# Patient Record
Sex: Male | Born: 1970 | Race: Black or African American | Hispanic: No | Smoking: Former smoker
Health system: Southern US, Community
[De-identification: ages and names within clinical notes are randomized; demographics above are authoritative.]

## PROBLEM LIST (undated history)

## (undated) DIAGNOSIS — B20 Human immunodeficiency virus [HIV] disease: Secondary | ICD-10-CM

## (undated) DIAGNOSIS — N289 Disorder of kidney and ureter, unspecified: Secondary | ICD-10-CM

## (undated) DIAGNOSIS — A159 Respiratory tuberculosis unspecified: Secondary | ICD-10-CM

---

## 2011-01-23 HISTORY — PX: ABSCESS DRAINAGE: SHX1119

## 2014-05-20 DIAGNOSIS — Z720 Tobacco use: Secondary | ICD-10-CM | POA: Insufficient documentation

## 2014-05-20 DIAGNOSIS — Z21 Asymptomatic human immunodeficiency virus [HIV] infection status: Secondary | ICD-10-CM | POA: Insufficient documentation

## 2017-01-31 ENCOUNTER — Other Ambulatory Visit: Payer: Self-pay

## 2017-01-31 ENCOUNTER — Emergency Department: Payer: Self-pay

## 2017-01-31 ENCOUNTER — Emergency Department
Admission: EM | Admit: 2017-01-31 | Discharge: 2017-01-31 | Disposition: A | Discharge status: Home / Self Care | Payer: Self-pay | Attending: Student in an Organized Health Care Education/Training Program | Admitting: Student in an Organized Health Care Education/Training Program

## 2017-01-31 ENCOUNTER — Encounter: Payer: Self-pay | Admitting: Emergency Medicine

## 2017-01-31 DIAGNOSIS — F17210 Nicotine dependence, cigarettes, uncomplicated: Secondary | ICD-10-CM | POA: Insufficient documentation

## 2017-01-31 DIAGNOSIS — J189 Pneumonia, unspecified organism: Secondary | ICD-10-CM

## 2017-01-31 DIAGNOSIS — J181 Lobar pneumonia, unspecified organism: Secondary | ICD-10-CM | POA: Insufficient documentation

## 2017-01-31 DIAGNOSIS — R0789 Other chest pain: Secondary | ICD-10-CM | POA: Insufficient documentation

## 2017-01-31 DIAGNOSIS — R05 Cough: Secondary | ICD-10-CM | POA: Insufficient documentation

## 2017-01-31 DIAGNOSIS — R10814 Left lower quadrant abdominal tenderness: Secondary | ICD-10-CM | POA: Insufficient documentation

## 2017-01-31 LAB — COMPREHENSIVE METABOLIC PANEL
ALBUMIN: 3.2 g/dL — AB (ref 3.5–5.0)
ALK PHOS: 183 U/L — AB (ref 38–126)
ALT: 15 U/L — ABNORMAL LOW (ref 17–63)
ANION GAP: 9 (ref 5–15)
AST: 37 U/L (ref 15–41)
BUN: 10 mg/dL (ref 6–20)
CHLORIDE: 98 mmol/L — AB (ref 101–111)
CO2: 26 mmol/L (ref 22–32)
Calcium: 8.6 mg/dL — ABNORMAL LOW (ref 8.9–10.3)
Creatinine, Ser: 0.81 mg/dL (ref 0.61–1.24)
GFR calc non Af Amer: >60 mL/min (ref >60)
GLUCOSE: 91 mg/dL (ref 65–99)
Potassium: 3.9 mmol/L (ref 3.5–5.1)
Sodium: 133 mmol/L — ABNORMAL LOW (ref 135–145)
Total Bilirubin: 1.6 mg/dL — ABNORMAL HIGH (ref 0.3–1.2)
Total Protein: 8.6 g/dL — ABNORMAL HIGH (ref 6.5–8.1)

## 2017-01-31 LAB — URINALYSIS, COMPLETE (UACMP) WITH MICROSCOPIC
Glucose, UA: 100 mg/dL — AB
HGB URINE DIPSTICK: NEGATIVE
KETONES UR: 15 mg/dL — AB
LEUKOCYTES UA: NEGATIVE
NITRITE: NEGATIVE
Protein, ur: 30 mg/dL — AB
Specific Gravity, Urine: 1.02 (ref 1.005–1.030)
pH: 6 (ref 5.0–8.0)

## 2017-01-31 LAB — CBC WITH DIFFERENTIAL/PLATELET
BASOS PCT: 0 %
Basophils Absolute: 0 10*3/uL (ref 0–0.1)
EOS PCT: 1 %
Eosinophils Absolute: 0 10*3/uL (ref 0–0.7)
HCT: 46 % (ref 40.0–52.0)
HEMOGLOBIN: 16.3 g/dL (ref 13.0–18.0)
LYMPHS ABS: 2.9 10*3/uL (ref 1.0–3.6)
Lymphocytes Relative: 42 %
MCH: 37.6 pg — AB (ref 26.0–34.0)
MCHC: 35.4 g/dL (ref 32.0–36.0)
MCV: 106.3 fL — ABNORMAL HIGH (ref 80.0–100.0)
MONOS PCT: 17 %
Monocytes Absolute: 1.1 10*3/uL — ABNORMAL HIGH (ref 0.2–1.0)
NEUTROS PCT: 40 %
Neutro Abs: 2.7 10*3/uL (ref 1.4–6.5)
PLATELETS: 235 10*3/uL (ref 150–440)
RBC: 4.33 MIL/uL — AB (ref 4.40–5.90)
RDW: 13.7 % (ref 11.5–14.5)
WBC: 6.7 10*3/uL (ref 3.8–10.6)

## 2017-01-31 LAB — LIPASE, BLOOD: Lipase: 20 U/L (ref 11–51)

## 2017-01-31 MED ORDER — AZITHROMYCIN 500 MG PO TABS
500.0000 mg | ORAL_TABLET | Freq: Every day | ORAL | Status: DC
  Administered 2017-01-31: 500 mg via ORAL
  Filled 2017-01-31: qty 1

## 2017-01-31 MED ORDER — SODIUM CHLORIDE 0.9 % IV BOLUS (SEPSIS)
1000.0000 mL | Freq: Once | INTRAVENOUS | Status: AC
  Administered 2017-01-31: 1000 mL via INTRAVENOUS

## 2017-01-31 MED ORDER — AZITHROMYCIN 250 MG PO TABS: ORAL_TABLET | ORAL | 0 refills | Status: DC

## 2017-01-31 NOTE — ED Notes (Addendum)
See triage note  States he developed right lower back pain about 1 week ago  States he has had some cough  Pain increases with cough and some movement denies any injury or urinary sx's  States he did have cough and noticed some blood this am with cough

## 2017-01-31 NOTE — ED Provider Notes (Signed)
[facility] Emergency Department Provider Note  ____________________________________________   First MD Initiated Contact with Patient 01/31/17 1403     (approximate)  I have reviewed the triage vital signs and the nursing notes.   HISTORY  Chief Complaint Back Pain   HPI Joshua Dougherty is a 47 y.o. male is here with complaint of right-sided back pain for 1 week.  Patient denies any injury or urinary symptoms.  He also denies any previous history of kidney stones.  He denies radiation into his right leg.  Patient also has productive cough and has felt feverish for the last 4 days.  Patient is concerned because he saw some blood in the mucus he coughed up this morning.  Patient a smoker and currently smokes less than a half a pack per day.  He rates his pain as an 8 out of 10.  History reviewed. No pertinent past medical history.  There are no active problems to display for this patient.   History reviewed. No pertinent surgical history.  Prior to Admission medications   Medication Sig Start Date End Date Taking? Authorizing Provider  azithromycin (ZITHROMAX Z-PAK) 250 MG tablet Take 1 tablet every day for the next 4 days starting Friday 01/31/17   Johnn Hai, PA-C    Allergies Patient has no known allergies.  History reviewed. No pertinent family history.  Social History Social History   Tobacco Use  . Smoking status: Never Smoker  . Smokeless tobacco: Never Used  Substance Use Topics  . Alcohol use: Yes    Frequency: Never  . Drug use: No    Review of Systems Constitutional: Subjective fever/no chills Eyes: No visual changes. ENT: No sore throat. Cardiovascular: Denies chest pain. Respiratory: Denies shortness of breath.  Positive for productive cough.  Positive hemoptysis today. Gastrointestinal: No abdominal pain.  No nausea, no vomiting.   Genitourinary: Negative for dysuria. Musculoskeletal: Positive for right flank pain.   Positive back pain. Skin: Negative for rash. Neurological: Negative for headaches, focal weakness or numbness. ____________________________________________   PHYSICAL EXAM:  VITAL SIGNS: ED Triage Vitals [01/31/17 1321]  Enc Vitals Group     BP      Pulse      Resp      Temp      Temp src      SpO2      Weight      Height      Head Circumference      Peak Flow      Pain Score 8     Pain Loc      Pain Edu?      Excl. in [place]?    Constitutional: Alert and oriented. Well appearing and in no acute distress. Eyes: Conjunctivae are normal.  Head: Atraumatic. Nose: Mild congestion/no rhinnorhea. Mouth/Throat: Mucous membranes are moist.  Oropharynx non-erythematous. Neck: No stridor.   Hematological/Lymphatic/Immunilogical: No cervical lymphadenopathy. Cardiovascular: Normal rate, regular rhythm. Grossly normal heart sounds.  Good peripheral circulation. Respiratory: Normal respiratory effort.  No retractions. Lungs no rales or rhonchi noted.  No expiratory wheezes. Gastrointestinal: Soft and nontender. No distention.  Right CVA tenderness. Musculoskeletal: On exam of the back there is no gross deformity and no point tenderness on palpation of the thoracic or lumbar spine.  Range of motion is not restricted.  Straight leg raises were negative.  Good muscle strength bilaterally.  Normal gait was noted. Neurologic:  Normal speech and language. No gross focal neurologic deficits are appreciated.  Reflexes 2+ bilaterally.  No gait instability. Skin:  Skin is warm, dry and intact.  Psychiatric: Mood and affect are normal. Speech and behavior are normal.  ____________________________________________   LABS (all labs ordered are listed, but only abnormal results are displayed)  Labs Reviewed  URINALYSIS, COMPLETE (UACMP) WITH MICROSCOPIC - Abnormal; Notable for the following components:      Result Value   Color, Urine AMBER (*)    Glucose, UA 100 (*)    Bilirubin Urine MODERATE  (*)    Ketones, ur 15 (*)    Protein, ur 30 (*)    Squamous Epithelial / LPF 0-5 (*)    Bacteria, UA RARE (*)    All other components within normal limits  CBC WITH DIFFERENTIAL/PLATELET - Abnormal; Notable for the following components:   RBC 4.33 (*)    MCV 106.3 (*)    MCH 37.6 (*)    Monocytes Absolute 1.1 (*)    All other components within normal limits  COMPREHENSIVE METABOLIC PANEL - Abnormal; Notable for the following components:   Sodium 133 (*)    Chloride 98 (*)    Calcium 8.6 (*)    Total Protein 8.6 (*)    Albumin 3.2 (*)    ALT 15 (*)    Alkaline Phosphatase 183 (*)    Total Bilirubin 1.6 (*)    All other components within normal limits  LIPASE, BLOOD    RADIOLOGY  Dg Chest 2 View  Result Date: 01/31/2017 CLINICAL DATA:  Right-sided chest pain and cough for 1 week. Hemoptysis last night. Smoker. EXAM: CHEST  2 VIEW COMPARISON:  None. FINDINGS: Ill-defined opacity within the posterior aspect of the right lower lobe, best seen on the lateral projection. Probable small right pleural effusion causing blunting of the right costophrenic angle. Left lung is clear. Heart size and mediastinal contours are normal. No acute or suspicious osseous finding. IMPRESSION: Right lower lobe airspace opacity and probable small associated pleural effusion. If febrile, this would most likely represent pneumonia. Neoplastic process cannot be confidently excluded given the history of smoking and hemoptysis. Pulmonary embolism could also cause a similar appearance, if clinical findings correlate. Consider chest CT or chest CT angiogram for further characterization. Electronically Signed   By: Franki Cabot M.D.   On: 01/31/2017 16:05    ____________________________________________   PROCEDURES  Procedure(s) performed: None  Procedures  Critical Care performed: No  ____________________________________________   INITIAL IMPRESSION / ASSESSMENT AND PLAN / ED COURSE Patient was made  aware of chest x-ray findings in the setting that he is febrile most likely is pneumonia.  He was given a list of clinics to establish primary care for follow-up.  He was given Zithromax 500 mg p.o. while in the department getting fluids.  He also was given a prescription at the time of discharge for Zithromax 250 mg 1 daily for the next 4 days.  He will increase fluids.  Take Tylenol or ibuprofen as needed for fever or backache.  He is encouraged to increase fluids. ____________________________________________   FINAL CLINICAL IMPRESSION(S) / ED DIAGNOSES  Final diagnoses:  Pneumonia of right lower lobe due to infectious organism [facility])     ED Discharge Orders        Ordered    azithromycin (ZITHROMAX Z-PAK) 250 MG tablet     01/31/17 1622       Note:  This document was prepared using Dragon voice recognition software and may include unintentional dictation errors.   Letitia Neri  L, PA-C 02/01/17 0388    Merlyn Lot, MD 02/01/17 [phone]

## 2017-01-31 NOTE — Discharge Instructions (Signed)
Follow up with one of the clinics listed on your discharge papers.  [facility] available [facility].   Take Zithromax beginning Friday as you had your first dose in the emergency department.  Increase fluids including Gatorade.  Return to the emergency department if any worsening of your symptoms.  Tylenol if needed for back pain or fever.  Discontinue smoking.

## 2017-01-31 NOTE — ED Triage Notes (Signed)
Pt to ed with c/o right flank pain x 1 week.  Pt denies injury. Denies urinary difficulty or pain.  Denies radiation of pain.

## 2018-03-17 ENCOUNTER — Emergency Department: Payer: Self-pay

## 2018-03-17 ENCOUNTER — Inpatient Hospital Stay
Admission: EM | Admit: 2018-03-17 | Discharge: 2018-03-21 | DRG: 177 | Disposition: A | Discharge status: Home / Self Care | Payer: Self-pay | Attending: Internal Medicine | Admitting: Internal Medicine

## 2018-03-17 ENCOUNTER — Other Ambulatory Visit: Payer: Self-pay

## 2018-03-17 DIAGNOSIS — R042 Hemoptysis: Secondary | ICD-10-CM | POA: Diagnosis present

## 2018-03-17 DIAGNOSIS — F17200 Nicotine dependence, unspecified, uncomplicated: Secondary | ICD-10-CM | POA: Diagnosis present

## 2018-03-17 DIAGNOSIS — J850 Gangrene and necrosis of lung: Principal | ICD-10-CM | POA: Diagnosis present

## 2018-03-17 DIAGNOSIS — E871 Hypo-osmolality and hyponatremia: Secondary | ICD-10-CM | POA: Diagnosis present

## 2018-03-17 DIAGNOSIS — M109 Gout, unspecified: Secondary | ICD-10-CM | POA: Diagnosis present

## 2018-03-17 DIAGNOSIS — B20 Human immunodeficiency virus [HIV] disease: Secondary | ICD-10-CM | POA: Diagnosis present

## 2018-03-17 DIAGNOSIS — R17 Unspecified jaundice: Secondary | ICD-10-CM

## 2018-03-17 DIAGNOSIS — Z8615 Personal history of latent tuberculosis infection: Secondary | ICD-10-CM

## 2018-03-17 DIAGNOSIS — J984 Other disorders of lung: Secondary | ICD-10-CM

## 2018-03-17 DIAGNOSIS — J189 Pneumonia, unspecified organism: Secondary | ICD-10-CM

## 2018-03-17 DIAGNOSIS — E43 Unspecified severe protein-calorie malnutrition: Secondary | ICD-10-CM | POA: Diagnosis present

## 2018-03-17 DIAGNOSIS — R748 Abnormal levels of other serum enzymes: Secondary | ICD-10-CM

## 2018-03-17 DIAGNOSIS — Z681 Body mass index (BMI) 19 or less, adult: Secondary | ICD-10-CM

## 2018-03-17 DIAGNOSIS — Z23 Encounter for immunization: Secondary | ICD-10-CM

## 2018-03-17 NOTE — ED Triage Notes (Signed)
Pt states he has been sick with a cough and congestion for the past week, states today has bloody sputum, denies having a sore throat. Pt is in NAD at present. Respirations WNL,skin is warm and dry.

## 2018-03-18 ENCOUNTER — Other Ambulatory Visit: Payer: Self-pay

## 2018-03-18 ENCOUNTER — Emergency Department: Payer: Self-pay

## 2018-03-18 ENCOUNTER — Encounter: Payer: Self-pay | Admitting: Internal Medicine

## 2018-03-18 DIAGNOSIS — R042 Hemoptysis: Secondary | ICD-10-CM | POA: Diagnosis present

## 2018-03-18 DIAGNOSIS — Z8615 Personal history of latent tuberculosis infection: Secondary | ICD-10-CM

## 2018-03-18 DIAGNOSIS — Z21 Asymptomatic human immunodeficiency virus [HIV] infection status: Secondary | ICD-10-CM

## 2018-03-18 DIAGNOSIS — L02213 Cutaneous abscess of chest wall: Secondary | ICD-10-CM

## 2018-03-18 DIAGNOSIS — R634 Abnormal weight loss: Secondary | ICD-10-CM

## 2018-03-18 DIAGNOSIS — F17200 Nicotine dependence, unspecified, uncomplicated: Secondary | ICD-10-CM

## 2018-03-18 DIAGNOSIS — Z8701 Personal history of pneumonia (recurrent): Secondary | ICD-10-CM

## 2018-03-18 DIAGNOSIS — Z681 Body mass index (BMI) 19 or less, adult: Secondary | ICD-10-CM

## 2018-03-18 DIAGNOSIS — J984 Other disorders of lung: Secondary | ICD-10-CM

## 2018-03-18 LAB — CBC WITH DIFFERENTIAL/PLATELET
ABS IMMATURE GRANULOCYTES: 0.04 10*3/uL (ref 0.00–0.07)
Basophils Absolute: 0 10*3/uL (ref 0.0–0.1)
Basophils Relative: 0 %
Eosinophils Absolute: 0 10*3/uL (ref 0.0–0.5)
Eosinophils Relative: 0 %
HEMATOCRIT: 46.1 % (ref 39.0–52.0)
HEMOGLOBIN: 16.2 g/dL (ref 13.0–17.0)
Immature Granulocytes: 1 %
LYMPHS ABS: 2.7 10*3/uL (ref 0.7–4.0)
LYMPHS PCT: 30 %
MCH: 35.4 pg — AB (ref 26.0–34.0)
MCHC: 35.1 g/dL (ref 30.0–36.0)
MCV: 100.7 fL — ABNORMAL HIGH (ref 80.0–100.0)
MONO ABS: 1 10*3/uL (ref 0.1–1.0)
MONOS PCT: 11 %
Neutro Abs: 5.2 10*3/uL (ref 1.7–7.7)
Neutrophils Relative %: 58 %
Platelets: 350 10*3/uL (ref 150–400)
RBC: 4.58 MIL/uL (ref 4.22–5.81)
RDW: 12.1 % (ref 11.5–15.5)
WBC: 8.9 10*3/uL (ref 4.0–10.5)
nRBC: 0 % (ref 0.0–0.2)

## 2018-03-18 LAB — INFLUENZA PANEL BY PCR (TYPE A & B)
INFLAPCR: NEGATIVE
Influenza B By PCR: NEGATIVE

## 2018-03-18 LAB — COMPREHENSIVE METABOLIC PANEL
ALK PHOS: 180 U/L — AB (ref 38–126)
ALT: 11 U/L (ref 0–44)
ANION GAP: 8 (ref 5–15)
AST: 26 U/L (ref 15–41)
Albumin: 3.1 g/dL — ABNORMAL LOW (ref 3.5–5.0)
BILIRUBIN TOTAL: 1.8 mg/dL — AB (ref 0.3–1.2)
BUN: 13 mg/dL (ref 6–20)
CALCIUM: 8.6 mg/dL — AB (ref 8.9–10.3)
CO2: 27 mmol/L (ref 22–32)
CREATININE: 0.84 mg/dL (ref 0.61–1.24)
Chloride: 93 mmol/L — ABNORMAL LOW (ref 98–111)
GFR calc non Af Amer: >60 mL/min (ref >60)
Glucose, Bld: 103 mg/dL — ABNORMAL HIGH (ref 70–99)
Potassium: 3.5 mmol/L (ref 3.5–5.1)
SODIUM: 128 mmol/L — AB (ref 135–145)
TOTAL PROTEIN: 9.2 g/dL — AB (ref 6.5–8.1)

## 2018-03-18 LAB — LACTIC ACID, PLASMA: Lactic Acid, Venous: 1.1 mmol/L (ref 0.5–1.9)

## 2018-03-18 LAB — EXPECTORATED SPUTUM ASSESSMENT W GRAM STAIN, RFLX TO RESP C

## 2018-03-18 LAB — TSH: TSH: 0.635 u[IU]/mL (ref 0.350–4.500)

## 2018-03-18 LAB — MRSA PCR SCREENING: MRSA by PCR: NEGATIVE

## 2018-03-18 LAB — PROCALCITONIN: PROCALCITONIN: 0.1 ng/mL

## 2018-03-18 MED ORDER — SODIUM CHLORIDE 0.9 % IV SOLN
3.0000 g | Freq: Four times a day (QID) | INTRAVENOUS | Status: DC
  Administered 2018-03-18 – 2018-03-21 (×11): 3 g via INTRAVENOUS
  Filled 2018-03-18 (×14): qty 3

## 2018-03-18 MED ORDER — VANCOMYCIN HCL IN DEXTROSE 1-5 GM/200ML-% IV SOLN
1000.0000 mg | Freq: Two times a day (BID) | INTRAVENOUS | Status: DC
  Filled 2018-03-18 (×2): qty 200

## 2018-03-18 MED ORDER — ENSURE ENLIVE PO LIQD
237.0000 mL | Freq: Three times a day (TID) | ORAL | Status: DC
  Administered 2018-03-18 – 2018-03-21 (×7): 237 mL via ORAL

## 2018-03-18 MED ORDER — DOCUSATE SODIUM 100 MG PO CAPS
100.0000 mg | ORAL_CAPSULE | Freq: Two times a day (BID) | ORAL | Status: DC
  Filled 2018-03-18: qty 1

## 2018-03-18 MED ORDER — ONDANSETRON HCL 4 MG PO TABS: 4.0000 mg | ORAL_TABLET | Freq: Four times a day (QID) | ORAL | Status: DC | PRN

## 2018-03-18 MED ORDER — LEVOFLOXACIN IN D5W 750 MG/150ML IV SOLN
750.0000 mg | INTRAVENOUS | Status: DC
  Administered 2018-03-18: 750 mg via INTRAVENOUS
  Filled 2018-03-18 (×2): qty 150

## 2018-03-18 MED ORDER — GUAIFENESIN 100 MG/5ML PO SOLN
5.0000 mL | ORAL | Status: DC | PRN
  Administered 2018-03-18: 100 mg via ORAL
  Filled 2018-03-18 (×2): qty 5

## 2018-03-18 MED ORDER — ACETAMINOPHEN 650 MG RE SUPP: 650.0000 mg | Freq: Four times a day (QID) | RECTAL | Status: DC | PRN

## 2018-03-18 MED ORDER — SENNA 8.6 MG PO TABS: 1.0000 | ORAL_TABLET | Freq: Every day | ORAL | Status: DC | PRN

## 2018-03-18 MED ORDER — SODIUM CHLORIDE 0.9 % IV SOLN
1.0000 g | Freq: Three times a day (TID) | INTRAVENOUS | Status: DC
  Administered 2018-03-18: 1 g via INTRAVENOUS
  Filled 2018-03-18 (×4): qty 1

## 2018-03-18 MED ORDER — SODIUM CHLORIDE 0.9 % IV SOLN
1.0000 g | INTRAVENOUS | Status: AC
  Administered 2018-03-18: 1 g via INTRAVENOUS
  Filled 2018-03-18: qty 10

## 2018-03-18 MED ORDER — INFLUENZA VAC SPLIT QUAD 0.5 ML IM SUSY
0.5000 mL | PREFILLED_SYRINGE | INTRAMUSCULAR | Status: AC
  Administered 2018-03-20: 0.5 mL via INTRAMUSCULAR
  Filled 2018-03-18: qty 0.5

## 2018-03-18 MED ORDER — ENOXAPARIN SODIUM 40 MG/0.4ML ~~LOC~~ SOLN
40.0000 mg | SUBCUTANEOUS | Status: DC
  Administered 2018-03-18: 40 mg via SUBCUTANEOUS
  Filled 2018-03-18 (×2): qty 0.4

## 2018-03-18 MED ORDER — SODIUM CHLORIDE 0.9 % IV SOLN
500.0000 mg | INTRAVENOUS | Status: DC
  Filled 2018-03-18: qty 500

## 2018-03-18 MED ORDER — IPRATROPIUM-ALBUTEROL 0.5-2.5 (3) MG/3ML IN SOLN: 3.0000 mL | RESPIRATORY_TRACT | Status: DC | PRN

## 2018-03-18 MED ORDER — ONDANSETRON HCL 4 MG/2ML IJ SOLN: 4.0000 mg | Freq: Four times a day (QID) | INTRAMUSCULAR | Status: DC | PRN

## 2018-03-18 MED ORDER — SODIUM CHLORIDE 0.9 % IV SOLN
INTRAVENOUS | Status: AC
  Administered 2018-03-18: 10:00:00 via INTRAVENOUS

## 2018-03-18 MED ORDER — SODIUM CHLORIDE 0.9 % IV SOLN
1500.0000 mg | Freq: Once | INTRAVENOUS | Status: AC
  Administered 2018-03-18: 1500 mg via INTRAVENOUS
  Filled 2018-03-18: qty 1500

## 2018-03-18 MED ORDER — ACETAMINOPHEN 325 MG PO TABS: 650.0000 mg | ORAL_TABLET | Freq: Four times a day (QID) | ORAL | Status: DC | PRN

## 2018-03-18 MED ORDER — BISACODYL 5 MG PO TBEC: 5.0000 mg | DELAYED_RELEASE_TABLET | Freq: Every day | ORAL | Status: DC | PRN

## 2018-03-18 MED ORDER — IOHEXOL 300 MG/ML  SOLN
75.0000 mL | Freq: Once | INTRAMUSCULAR | Status: AC | PRN
  Administered 2018-03-18: 75 mL via INTRAVENOUS

## 2018-03-18 MED ORDER — PNEUMOCOCCAL VAC POLYVALENT 25 MCG/0.5ML IJ INJ
0.5000 mL | INJECTION | INTRAMUSCULAR | Status: AC
  Administered 2018-03-20: 0.5 mL via INTRAMUSCULAR
  Filled 2018-03-18: qty 0.5

## 2018-03-18 NOTE — H&P (Deleted)
The patient complains of cough but no shortness of breath. Vital signs reviewed, labs reviewed, physical examination done. Continue current treatment.  Added Robitussin and DuoNeb as needed. Severe malnutrition.  Dietitian consult.  Discussed with patient and nurse.  Time spent about 25 minutes.

## 2018-03-18 NOTE — Progress Notes (Signed)
The patient complains of cough but no shortness of breath. Vital signs reviewed, labs reviewed, physical examination done. Continue current treatment.  Added Robitussin and DuoNeb as needed. Severe malnutrition.  Dietitian consult.  Discussed with patient and nurse.  Time spent about 25 minutes.

## 2018-03-18 NOTE — Consult Note (Signed)
NAME: Joshua Dougherty  DOB: [birth year]  MRN: [id number]  Date/Time: 03/18/2018 10:02 AM  REQUESTING PROVIDER: Karma Greaser Subjective:  REASON FOR CONSULT: TB ?pt is a limited historian, not forthcoming- [facility] chart reviewed Joshua Dougherty is a 48 y.o. male came to the ED with coughing up blood stained sputum of 2 days duration. He was noted to have a cavitary lesion on his rt lung and was placed on airborne isolated for TB and I am asked to see the patient for the same- Pt had denied about HIV but when I reviewed [facility] records he had been tested positive for HIV in 2012 and when I asked him he initially denied knowing he was HIV positive and when I showed him [facility] records he agreed. 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  Hep A ab+ Hep B sab+ hcv ab neg 2016  G6pd 340 (normal) Previous/most recent care location: [facility] Most recent cd4/viral load 02/12/14 <20 bl vt .94 cd4 848 HLA [id number] Negative 04/19/13 STI history: none  ------------------------------------- Pt has lost close to 40 pounds since 2017 Says he had fever last week He has chronic cough of 1 year duration- whitish sputum until 2 days ago when it became blood stained He has loose stoolsoccasionally  Was diagnosed with pneumonia a year ago and that was the last time he took antibiotics    PMH HIV GOUT Chest wall abscess LTBI-treated  Past Surgical History:  Procedure Laterality Date  . ABSCESS DRAINAGE  2013   chest wall; patient states it was not contiguous  with his lung however this was an operation under general anesthesia   hernia repair    [place] Lives on his own Smoker- current Alcohol- 2-3 glasses of vodka once a week Denies illicit drug use  History reviewed. No pertinent family history. No Known Allergies ? Current Facility-Administered Medications  Medication Dose Route Frequency Provider Last Rate Last Dose  . 0.9 %  sodium chloride infusion   Intravenous Continuous Demetrios Loll, MD      . acetaminophen (TYLENOL) tablet 650 mg  650 mg Oral Q6H PRN Harrie Foreman, MD       Or  . acetaminophen (TYLENOL) suppository 650 mg  650 mg Rectal Q6H PRN Harrie Foreman, MD      . bisacodyl (DULCOLAX) EC tablet 5 mg  5 mg Oral Daily PRN Demetrios Loll, MD      . ceFEPIme (MAXIPIME) 1 g in sodium chloride 0.9 % 100 mL IVPB  1 g Intravenous Q8H Harrie Foreman, MD      . docusate sodium (COLACE) capsule 100 mg  100 mg Oral BID Harrie Foreman, MD      . enoxaparin (LOVENOX) injection 40 mg  40 mg Subcutaneous Q24H Harrie Foreman, MD   40 mg at 03/18/18 0537  . guaiFENesin (ROBITUSSIN) 100 MG/5ML solution 100 mg  5 mL Oral Q4H PRN Demetrios Loll, MD      . Derrill Memo ON 03/19/2018] Influenza vac split quadrivalent PF (FLUARIX) injection 0.5 mL  0.5 mL Intramuscular Tomorrow-1000 Harrie Foreman,  MD      . ipratropium-albuterol (DUONEB) 0.5-2.5 (3) MG/3ML nebulizer solution 3 mL  3 mL Nebulization Q4H PRN Demetrios Loll, MD      . levofloxacin (LEVAQUIN) IVPB 750 mg  750 mg Intravenous Q24H Harrie Foreman, MD      . ondansetron [facility]) tablet 4 mg  4 mg Oral Q6H PRN Harrie Foreman, MD       Or  . ondansetron [facility]) injection 4 mg  4 mg Intravenous Q6H PRN Harrie Foreman, MD      . Derrill Memo ON 03/19/2018] pneumococcal 23 valent vaccine (PNU-IMMUNE) injection 0.5 mL  0.5 mL Intramuscular Tomorrow-1000 Harrie Foreman, MD      . senna [facility]) tablet 8.6 mg  1 tablet Oral Daily PRN Demetrios Loll, MD      . vancomycin (VANCOCIN) IVPB  1000 mg/200 mL premix  1,000 mg Intravenous Q12H Harrie Foreman, MD         Abtx:  Anti-infectives (From admission, onward)   Start     Dose/Rate Route Frequency Ordered Stop   03/18/18 1700  vancomycin (VANCOCIN) IVPB 1000 mg/200 mL premix     1,000 mg 200 mL/hr over 60 Minutes Intravenous Every 12 hours 03/18/18 0916     03/18/18 1000  levofloxacin (LEVAQUIN) IVPB 750 mg     750 mg 100 mL/hr over 90 Minutes Intravenous Every 24 hours 03/18/18 0845     03/18/18 0900  ceFEPIme (MAXIPIME) 1 g in sodium chloride 0.9 % 100 mL IVPB     1 g 200 mL/hr over 30 Minutes Intravenous Every 8 hours 03/18/18 0831 03/26/18 0559   03/18/18 0215  cefTRIAXone (ROCEPHIN) 1 g in sodium chloride 0.9 % 100 mL IVPB     1 g 200 mL/hr over 30 Minutes Intravenous STAT 03/18/18 0202 03/18/18 0434   03/18/18 0215  vancomycin (VANCOCIN) 1,500 mg in sodium chloride 0.9 % 500 mL IVPB     1,500 mg 250 mL/hr over 120 Minutes Intravenous  Once 03/18/18 0202 03/18/18 0525      REVIEW OF SYSTEMS:  Const:  fever, negative chills, +++ weight loss Eyes: negative diplopia or visual changes, negative eye pain ENT: negative coryza, negative sore throat Resp: ++ cough, ++hemoptysis, dyspnea Cards: negative for chest pain, palpitations, lower extremity edema GU: negative for frequency, dysuria and hematuria GI: Negative for abdominal pain,  bleeding, constipation Skin: negative for rash and pruritus Heme: negative for easy bruising and gum/nose bleeding MS: negative for myalgias, arthralgias, back pain and muscle weakness Neurolo:negative for headaches, dizziness, vertigo, memory problems  Psych: negative for feelings of anxiety, depression  Endocrine: no polyuria or polydipsiaAllergy/Immunology- negative for any medication or food allergies ? Pertinent Positives include : Objective:  VITALS:  BP 136/88 (BP Location: Right Arm)   Pulse 77   Temp 99.5 F (37.5 C) (Oral)   Resp 18   Ht '6\' 4"'$  (1.93 m)   Wt  62.6 kg   SpO2 100%   BMI 16.80 kg/m  PHYSICAL EXAM:  General: Alert, cooperative, no distress, thin   Head: Normocephalic, without obvious abnormality, atraumatic. Eyes: Conjunctivae clear, anicteric sclerae. Pupils are equal ENT Nares normal. No drainage or sinus tenderness. Tongue coated-white patch gingivitis Neck: Supple, symmetrical, no adenopathy, thyroid: non tender no carotid bruit and no JVD. Back: No CVA tenderness. Lungs: b/l air entry Heart: Regular rate and rhythm, no murmur, rub or gallop. Abdomen: Soft, non-tender,not distended. Bowel sounds normal. No masses Extremities: atraumatic, no cyanosis. No  edema. No clubbing Skin: No rashes or lesions. Or bruising Lymph: Cervical, supraclavicular normal. Neurologic: Grossly non-focal Pertinent Labs Lab Results CBC    Component Value Date/Time   WBC 8.9 03/18/2018 0029   RBC 4.58 03/18/2018 0029   HGB 16.2 03/18/2018 0029   HCT 46.1 03/18/2018 0029   PLT 350 03/18/2018 0029   MCV 100.7 (H) 03/18/2018 0029   MCH 35.4 (H) 03/18/2018 0029   MCHC 35.1 03/18/2018 0029   RDW 12.1 03/18/2018 0029   LYMPHSABS 2.7 03/18/2018 0029   MONOABS 1.0 03/18/2018 0029   EOSABS 0.0 03/18/2018 0029   BASOSABS 0.0 03/18/2018 0029    CMP Latest Ref Rng & Units 03/18/2018 01/31/2017  Glucose 70 - 99 mg/dL 103(H) 91  BUN 6 - 20 mg/dL 13 10  Creatinine 0.61 - 1.24 mg/dL 0.84 0.81  Sodium 135 - 145 mmol/L 128(L) 133(L)  Potassium 3.5 - 5.1 mmol/L 3.5 3.9  Chloride 98 - 111 mmol/L 93(L) 98(L)  CO2 22 - 32 mmol/L 27 26  Calcium 8.9 - 10.3 mg/dL 8.6(L) 8.6(L)  Total Protein 6.5 - 8.1 g/dL 9.2(H) 8.6(H)  Total Bilirubin 0.3 - 1.2 mg/dL 1.8(H) 1.6(H)  Alkaline Phos 38 - 126 U/L 180(H) 183(H)  AST 15 - 41 U/L 26 37  ALT 0 - 44 U/L 11 15(L)      Microbiology: Recent Results (from the past 240 hour(s))  Blood Culture (routine x 2)     Status: None (Preliminary result)   Collection Time: 03/18/18 12:29 AM  Result Value Ref Range  Status   Specimen Description BLOOD LEFT FATTY CASTS  Final   Special Requests   Final    BOTTLES DRAWN AEROBIC AND ANAEROBIC Blood Culture results may not be optimal due to an inadequate volume of blood received in culture bottles   Culture   Final    NO GROWTH < 12 HOURS Performed at [facility], [address], [place], [place] [address]    Report Status PENDING  Incomplete  Blood Culture (routine x 2)     Status: None (Preliminary result)   Collection Time: 03/18/18 12:29 AM  Result Value Ref Range Status   Specimen Description BLOOD RIGHT ASSIST CONTROL  Final   Special Requests   Final    BOTTLES DRAWN AEROBIC AND ANAEROBIC Blood Culture results may not be optimal due to an inadequate volume of blood received in culture bottles   Culture   Final    NO GROWTH < 12 HOURS Performed at [facility], [address] [place], [place] [address]    Report Status PENDING  Incomplete    IMAGING RESULTS:  Focal area of airspace consolidation in the inferior right upper lung with central cavitation. This likely represents a cavitary or necrotizing pneumonia. TB or atypical infection could also have this appearance in the appropriate clinical setting I have personally reviewed the films ? Impression/Recommendation ? ?48 yr male with h/o HIV presenting with cough and hemoptysis  Thick walled cavitary lesion rt lung- with h/o LTBI and HIV ( on no treatment currently) will need to r/o TB . D.D /necrotizing pneumonia / lung abscess /fungal opportunistic infection Will DC vanco. 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   ? ___________________________________________________ Discussed with patient, requesting provider

## 2018-03-18 NOTE — H&P (Signed)
Joshua Dougherty is an 48 y.o. male.   Chief Complaint: Hemoptysis HPI: The patient with no chronic medical problems presents to the emergency department complaining of cough productive of blood-tinged sputum.  The patient states that he feels weak and short of breath.  Oxygen saturations were normal on room air, however chest x-ray showed upper lobe pneumonia.  CT further characterized infiltrate as a cavitary lesion.  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  Patient received broad-spectrum biotics in the emergency department.  A sputum sample was obtained for acid-fast testing prior to the emergency department staff calling the hospitalist service for admission.  History reviewed. No pertinent past medical history.  No chronic medical problems  Past Surgical History:  Procedure Laterality Date  . ABSCESS DRAINAGE  2013   chest wall; patient states it was not contiguous with his lung however this was an operation under general anesthesia    History reviewed. No pertinent family history.  No history of CAD, diabetes or hypertension in immediate family  Social History:  reports that he has been smoking. He uses smokeless tobacco. He reports current alcohol use. He reports that he does not use drugs.  Allergies: No Known Allergies  No medications prior to admission.    Results for orders placed or performed during the hospital encounter of 03/17/18 (from the past 48 hour(s))  TSH     Status: None   Collection Time: 03/18/18 12:08 AM  Result Value Ref Range   TSH 0.635 0.350 - 4.500 uIU/mL    Comment: Performed by a 3rd Generation assay with a functional sensitivity of <=0.01 uIU/mL. Performed at [facility], [address] [place], [place] [address]   CBC with Differential/Platelet     Status: Abnormal   Collection Time: 03/18/18 12:29 AM  Result Value Ref Range   WBC 8.9 4.0 - 10.5 K/uL   RBC 4.58 4.22  - 5.81 MIL/uL   Hemoglobin 16.2 13.0 - 17.0 g/dL   HCT 46.1 39.0 - 52.0 %   MCV 100.7 (H) 80.0 - 100.0 fL   MCH 35.4 (H) 26.0 - 34.0 pg   MCHC 35.1 30.0 - 36.0 g/dL   RDW 12.1 11.5 - 15.5 %   Platelets 350 150 - 400 K/uL   nRBC 0.0 0.0 - 0.2 %   Neutrophils Relative % 58 %   Neutro Abs 5.2 1.7 - 7.7 K/uL   Lymphocytes Relative 30 %   Lymphs Abs 2.7 0.7 - 4.0 K/uL   Monocytes Relative 11 %   Monocytes Absolute 1.0 0.1 - 1.0 K/uL   Eosinophils Relative 0 %   Eosinophils Absolute 0.0 0.0 - 0.5 K/uL   Basophils Relative 0 %   Basophils Absolute 0.0 0.0 - 0.1 K/uL   Immature Granulocytes 1 %   Abs Immature Granulocytes 0.04 0.00 - 0.07 K/uL    Comment: Performed at [facility], [address] [place], [place] [address]  Comprehensive metabolic panel     Status: Abnormal   Collection Time: 03/18/18 12:29 AM  Result Value Ref Range   Sodium 128 (L) 135 - 145 mmol/L   Potassium 3.5 3.5 - 5.1 mmol/L   Chloride 93 (L) 98 - 111 mmol/L   CO2 27 22 - 32 mmol/L   Glucose, Bld 103 (H) 70 - 99 mg/dL   BUN 13 6 - 20 mg/dL   Creatinine, Ser 0.84 0.61 - 1.24 mg/dL  Calcium 8.6 (L) 8.9 - 10.3 mg/dL   Total Protein 9.2 (H) 6.5 - 8.1 g/dL   Albumin 3.1 (L) 3.5 - 5.0 g/dL   AST 26 15 - 41 U/L   ALT 11 0 - 44 U/L   Alkaline Phosphatase 180 (H) 38 - 126 U/L   Total Bilirubin 1.8 (H) 0.3 - 1.2 mg/dL   GFR calc non Af Amer >60 >60 mL/min   GFR calc Af Amer >60 >60 mL/min   Anion gap 8 5 - 15    Comment: Performed at [facility], [address] [place], [place] [address]  Lactic acid, plasma     Status: None   Collection Time: 03/18/18 12:29 AM  Result Value Ref Range   Lactic Acid, Venous 1.1 0.5 - 1.9 mmol/L    Comment: Performed at [facility], [address] [place], [place] [address]  Procalcitonin     Status: None   Collection Time: 03/18/18 12:29 AM  Result Value Ref Range   Procalcitonin 0.10 ng/mL    Comment:        Interpretation: PCT  (Procalcitonin) <= 0.5 ng/mL: Systemic infection (sepsis) is not likely. Local bacterial infection is possible. (NOTE)       Sepsis PCT Algorithm           Lower Respiratory Tract                                      Infection PCT Algorithm    ----------------------------     ----------------------------         PCT < 0.25 ng/mL                PCT < 0.10 ng/mL         Strongly encourage             Strongly discourage   discontinuation of antibiotics    initiation of antibiotics    ----------------------------     -----------------------------       PCT 0.25 - 0.50 ng/mL            PCT 0.10 - 0.25 ng/mL               OR       >80% decrease in PCT            Discourage initiation of                                            antibiotics      Encourage discontinuation           of antibiotics    ----------------------------     -----------------------------         PCT >= 0.50 ng/mL              PCT 0.26 - 0.50 ng/mL               AND        <80% decrease in PCT             Encourage initiation of  antibiotics       Encourage continuation           of antibiotics    ----------------------------     -----------------------------        PCT >= 0.50 ng/mL                  PCT > 0.50 ng/mL               AND         increase in PCT                  Strongly encourage                                      initiation of antibiotics    Strongly encourage escalation           of antibiotics                                     -----------------------------                                           PCT <= 0.25 ng/mL                                                 OR                                        > 80% decrease in PCT                                     Discontinue / Do not initiate                                             antibiotics Performed at [facility], [address] [place], [place] [address]   Blood Culture  (routine x 2)     Status: None (Preliminary result)   Collection Time: 03/18/18 12:29 AM  Result Value Ref Range   Specimen Description BLOOD LEFT FATTY CASTS    Special Requests      BOTTLES DRAWN AEROBIC AND ANAEROBIC Blood Culture results may not be optimal due to an inadequate volume of blood received in culture bottles   Culture      NO GROWTH < 12 HOURS Performed at [facility], [address] [place], [place] [address]    Report Status PENDING   Blood Culture (routine x 2)     Status: None (Preliminary result)   Collection Time: 03/18/18 12:29 AM  Result Value Ref Range   Specimen Description BLOOD RIGHT ASSIST CONTROL    Special Requests      BOTTLES DRAWN AEROBIC AND ANAEROBIC Blood Culture results may not be optimal due to an inadequate  volume of blood received in culture bottles   Culture      NO GROWTH < 12 HOURS Performed at [facility], [address] [place], [place] [address]    Report Status PENDING   Influenza panel by PCR (type A & B)     Status: None   Collection Time: 03/18/18 12:29 AM  Result Value Ref Range   Influenza A By PCR NEGATIVE NEGATIVE   Influenza B By PCR NEGATIVE NEGATIVE    Comment: (NOTE) The Xpert Xpress Flu assay is intended as an aid in the diagnosis of  influenza and should not be used as a sole basis for treatment.  This  assay is FDA approved for nasopharyngeal swab specimens only. Nasal  washings and aspirates are unacceptable for Xpert Xpress Flu testing. Performed at [facility], [address] [place], [place] [address]    Dg Chest 2 View  Result Date: 03/17/2018 CLINICAL DATA:  Shortness of breath, bloody sputum EXAM: CHEST - 2 VIEW COMPARISON:  01/31/2017 FINDINGS: Airspace consolidation noted in the right upper lobe with cavitation. This is new since prior study concerning for cavitary pneumonia. Left lung clear. Previously seen opacity at the right lung base has resolved. Heart is normal size.  No effusions or acute bony abnormality. IMPRESSION: New cavitary airspace disease in the right upper lobe concerning for cavitary pneumonia. Electronically Signed   By: Rolm Baptise M.D.   On: 03/17/2018 18:53   Ct Chest W Contrast  Result Date: 03/18/2018 CLINICAL DATA:  Cough and congestion for the past week. Bloody sputum. EXAM: CT CHEST WITH CONTRAST TECHNIQUE: Multidetector CT imaging of the chest was performed during intravenous contrast administration. CONTRAST:  67mL OMNIPAQUE IOHEXOL 300 MG/ML  SOLN COMPARISON:  Chest radiograph 03/17/2018 FINDINGS: Cardiovascular: No significant vascular findings. Normal heart size. No pericardial effusion. Mediastinum/Nodes: No significant lymphadenopathy. Esophagus is decompressed. Lungs/Pleura: Emphysematous changes in the lungs. Focal area of airspace consolidation in the inferior right upper lung with central cavitation. This likely represents a cavitary or necrotizing pneumonia. TB or atypical infection could also have this appearance in the appropriate clinical setting. Follow-up after resolution of acute process is recommended to exclude underlying neoplasm. Nodular opacity in the right posterior costophrenic angle measuring 10 mm diameter. This is probably atelectasis or scarring but this could be also evaluated on follow-up. Left lung is clear. No pleural effusions. No pneumothorax. Upper Abdomen: No acute abnormalities. Musculoskeletal: No chest wall abnormality. No acute or significant osseous findings. IMPRESSION: 1. Focal area of airspace consolidation in the inferior right upper lung with central cavitation. This likely represents a cavitary or necrotizing pneumonia. TB or atypical infection could also have this appearance in the appropriate clinical setting. Follow-up after resolution of acute process is recommended to exclude underlying neoplasm. 2. Emphysematous changes in the lungs. 3. 10 mm nodular opacity in the right posterior costophrenic angle  is probably atelectasis or scarring but could be also evaluated on follow-up. Emphysema (ICD10-J43.9). Electronically Signed   By: Lucienne Capers M.D.   On: 03/18/2018 02:40    Review of Systems  Constitutional: Positive for weight loss. Negative for chills and fever.  HENT: Negative for sore throat and tinnitus.   Eyes: Negative for blurred vision and redness.  Respiratory: Positive for hemoptysis and shortness of breath. Negative for cough.   Cardiovascular: Negative for chest pain, palpitations, orthopnea and PND.  Gastrointestinal: Negative for abdominal pain, diarrhea, nausea and vomiting.  Genitourinary: Negative for dysuria, frequency and urgency.  Musculoskeletal: Negative for joint pain and myalgias.  Skin: Negative for rash.       No lesions  Neurological: Negative for speech change, focal weakness and weakness.  Endo/Heme/Allergies: Does not bruise/bleed easily.       No temperature intolerance  Psychiatric/Behavioral: Negative for depression and suicidal ideas.    Blood pressure 136/88, pulse 77, temperature 99.5 F (37.5 C), temperature source Oral, resp. rate 18, height 6\' 4"  (1.93 m), weight 62.6 kg, SpO2 100 %. Physical Exam  Vitals reviewed. Constitutional: He is oriented to person, place, and time. He appears well-developed and well-nourished. No distress.  HENT:  Head: Normocephalic and atraumatic.  Mouth/Throat: Oropharynx is clear and moist.  Eyes: Pupils are equal, round, and reactive to light. Conjunctivae and EOM are normal. No scleral icterus.  Neck: Normal range of motion. Neck supple. No JVD present. No tracheal deviation present. No thyromegaly present.  Cardiovascular: Normal rate, regular rhythm and normal heart sounds. Exam reveals no gallop and no friction rub.  No murmur heard. Respiratory: Effort normal and breath sounds normal. No respiratory distress.  GI: Soft. Bowel sounds are normal. He exhibits no distension. There is no abdominal tenderness.   Genitourinary:    Genitourinary Comments: Deferred   Musculoskeletal: Normal range of motion.        General: No edema.  Lymphadenopathy:    He has no cervical adenopathy.  Neurological: He is alert and oriented to person, place, and time. No cranial nerve deficit.  Skin: Skin is warm and dry. No rash noted. No erythema.  Psychiatric: He has a normal mood and affect. His behavior is normal. Judgment and thought content normal.     Assessment/Plan This is a 48 year old male admitted for hemoptysis. 1.  Hemoptysis: Associated with excessive fatigue as well as weight loss.  Concern for TB reactivation is high.  Await QuantiFERON gold test results.  For now continue vancomycin. 2.  Pneumonia: Healthcare associated (due to history of incarceration as well as likely immunocompromise).  Substitute cefepime for ceftriaxone given in the emergency department.  Add Levaquin until further information regarding TB status 3.  Hyponatremia: Secondary to lung infection/chronic inflammation.  Encourage p.o. intake.  Hydrate with normal saline. 4.  Underweight: BMI is 16.8; encourage p.o. intake.  Potentially secondary to weight loss from TB infection. 5.  DVT prophylaxis: Lovenox 6.  GI prophylaxis: None The patient is a full code.  Time spent on admission orders and patient care approximately 45 minutes  Harrie Foreman, MD 03/18/2018, 8:21 AM

## 2018-03-18 NOTE — Progress Notes (Signed)
Initial Nutrition Assessment  DOCUMENTATION CODES:   Severe malnutrition in context of chronic illness, Underweight  INTERVENTION:  Provide Ensure Enlive po TID, each supplement provides 350 kcal and 20 grams of protein. Patient prefers strawberry.  Encouraged adequate intake of calories and protein. Discussed choosing calorie- and protein-dense foods.  Monitor magnesium, potassium, and phosphorus daily for at least 3 days, MD to replete as needed, as pt is at risk for refeeding syndrome given severe malnutrition.  NUTRITION DIAGNOSIS:   Severe Malnutrition related to chronic illness(etiology unknown) as evidenced by severe fat depletion, severe muscle depletion.  GOAL:   Patient will meet greater than or equal to 90% of their needs  MONITOR:   PO intake, Supplement acceptance, Labs, Weight trends, Skin, I & O's  REASON FOR ASSESSMENT:   Malnutrition Screening Tool, Consult Assessment of nutrition requirement/status  ASSESSMENT:   48 year old male with no significant PMHx admitted with hemoptysis, PNA, hyponatremia undergoing work-up for TB.   Met with patient at bedside. He reports he has had a poor appetite for about 1.5 years now. He endorses anorexia and decreased taste. He tries to eat 1-2 meals per day but reports they are smaller meals. He may have burgers, pizza, sandwiches, or a meat with sides. He has not yet tried drinking ONS but is willing to add them into diet. 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  UBW was 180-185 lbs. Patient reports he has slowly lost weight over the past 1.5 years. Currently 62.6 kg (138.01 lbs).   Medications reviewed and include: cefepime, Levaquin, vancomycin.  Labs reviewed: Sodium 128, Chloride 93.  NUTRITION - FOCUSED PHYSICAL EXAM:    Most Recent Value  Orbital Region  Severe depletion  Upper Arm Region  Severe depletion  Thoracic and Lumbar Region  Severe depletion  Buccal Region  Severe depletion  Temple Region  Severe  depletion  Clavicle Bone Region  Severe depletion  Clavicle and Acromion Bone Region  Severe depletion  Scapular Bone Region  Severe depletion  Dorsal Hand  Severe depletion  Patellar Region  Severe depletion  Anterior Thigh Region  Severe depletion  Posterior Calf Region  Severe depletion  Edema (RD Assessment)  None  Hair  Reviewed  Eyes  Reviewed  Mouth  Reviewed  Skin  Reviewed  Nails  Reviewed     Diet Order:   Diet Order            Diet regular Room service appropriate? Yes; Fluid consistency: Thin  Diet effective now             EDUCATION NEEDS:   Education needs have been addressed  Skin:  Skin Assessment: Reviewed RN Assessment  Last BM:  03/18/2018 - medium type 6  Height:   Ht Readings from Last 1 Encounters:  03/18/18 _0  (1.93 m)   Weight:   Wt Readings from Last 1 Encounters:  03/18/18 62.6 kg   Ideal Body Weight:  91.8 kg  BMI:  Body mass index is 16.8 kg/m.  Estimated Nutritional Needs:   Kcal:  8638-1771 (MSJ x 1.2-1.4)  Protein:  95-110 grams (1.5-1.8 grams/kg)  Fluid:  2-2.2 L/day (1 mL/kcal)  Willey Blade, MS, RD, LDN Office: [phone] Pager: [phone] After Hours/Weekend Pager: [phone]

## 2018-03-18 NOTE — Progress Notes (Signed)
Pharmacy Antibiotic Note  Joshua Dougherty is a 48 y.o. male admitted on 03/17/2018 with pneumonia.  Pharmacy has been consulted for Vancomycin and Levaquin dosing. Patient also on Cefepime  Plan: Patient received CTX x 1 in ER, Vancomycin Loading dose of 1500mg  x 1 in ER. -Will continue with Vancomycin 1 gram IV q12h Goal AUC 400-550. Expected AUC: 531 SCr used: 0.84 TBW 62.6 kg  -Will order Levaquin 750 mg IV q24h  CT further characterized infiltrate as a cavitary lesion.  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  concern for TB reactivation- per MD note for now continue Vanc, Add Levaquin until further info regarding TB status    Height: 6\' 4"  (193 cm) Weight: 138 lb 0.1 oz (62.6 kg) IBW/kg (Calculated) : 86.8  Temp (24hrs), Avg:98.8 F (37.1 C), Min:98.1 F (36.7 C), Max:99.5 F (37.5 C)  Recent Labs  Lab 03/18/18 0029  WBC 8.9  CREATININE 0.84  LATICACIDVEN 1.1    Estimated Creatinine Clearance: 95.2 mL/min (by C-G formula based on SCr of 0.84 mg/dL).    No Known Allergies  Antimicrobials this admission: CTX x 1 in ED  Cefepime 2/25 >> Levaquin 2/25 >> Vancomycin 2/25 >>  Dose adjustments this admission:    Microbiology results: 2/25 BCx: NGx12hr   UCx:      Sputum:    2/25 pend MRSA PCR:   2/25 AFB pend  Thank you for allowing pharmacy to be a part of this patient's care.  Clariza Sickman A 03/18/2018 9:16 AM

## 2018-03-18 NOTE — ED Provider Notes (Signed)
[facility] Emergency Department Provider Note  ____________________________________________   First MD Initiated Contact with Patient 03/18/18 0016     (approximate)  I have reviewed the triage vital signs and the nursing notes.   HISTORY  Chief Complaint Hemoptysis    HPI Joshua Dougherty is a 48 y.o. male who reports no prior medical issues (although additional information came out as we discussed as per below) other than daily tobacco use.  He presents for evaluation of about a week of nasal congestion, runny nose, generalized malaise, and some cough that over the last 12 hours or so has developed into hemoptysis.  He says that he is not short of breath and he has not had any fever that he is aware of.  He has a little bit of chest pain when he coughs but otherwise no chest pain.  He denies nausea, vomiting, abdominal pain, and dysuria.  He describes the blood when he coughs as a moderate amount mixed in with sputum but not frank blood.  The patient believes that he is HIV negative.  He states that he spent 16 months in prison about 3 years ago.  He has never been in the [employer].  He also reports that about 30 years ago he was diagnosed with tuberculosis and says that he took the medicines he was given for what he thinks was about 6 months.      History reviewed. No pertinent past medical history.  Patient Active Problem List   Diagnosis Date Noted  . Hemoptysis 03/18/2018    History reviewed. No pertinent surgical history.  Prior to Admission medications   Not on File    Allergies Patient has no known allergies.  No family history on file.  Social History Social History   Tobacco Use  . Smoking status: Current Every Day Smoker  . Smokeless tobacco: Current User  Substance Use Topics  . Alcohol use: Yes    Frequency: Never  . Drug use: No    Review of Systems Constitutional: No fever/chills Eyes: No visual changes. ENT: Nasal  congestion for about a week, mild sore throat that has resolved. Cardiovascular: Denies chest pain. Respiratory: Cough for about a week that has developed bloody sputum over the last 24 hours.  Denies shortness of breath. Gastrointestinal: No abdominal pain.  No nausea, no vomiting.  No diarrhea.  No constipation. Genitourinary: Negative for dysuria. Musculoskeletal: Negative for neck pain.  Negative for back pain. Integumentary: Negative for rash. Neurological: Negative for headaches, focal weakness or numbness.   ____________________________________________   PHYSICAL EXAM:  VITAL SIGNS: ED Triage Vitals  Enc Vitals Group     BP 03/17/18 1813 131/90     Pulse Rate 03/17/18 1813 88     Resp 03/17/18 1813 18     Temp 03/17/18 1813 98.1 F (36.7 C)     Temp Source 03/17/18 1813 Oral     SpO2 03/17/18 1813 97 %     Weight 03/17/18 1814 68 kg (150 lb)     Height 03/17/18 1814 1.93 m (6\' 4" )     Head Circumference --      Peak Flow --      Pain Score 03/17/18 1813 6     Pain Loc --      Pain Edu? --      Excl. in [place]? --     Constitutional: Alert and oriented. Well appearing and in no acute distress. Eyes: Conjunctivae are normal.  Head: Atraumatic. Nose:  No congestion/rhinnorhea. Mouth/Throat: Mucous membranes are moist. Neck: No stridor.  No meningeal signs.   Cardiovascular: Normal rate, regular rhythm. Good peripheral circulation. Grossly normal heart sounds. Respiratory: Normal respiratory effort.  No retractions. Lungs CTAB. Gastrointestinal: Thin body habitus.  Soft and nontender. No distention.  Musculoskeletal: No lower extremity tenderness nor edema. No gross deformities of extremities. Neurologic:  Normal speech and language. No gross focal neurologic deficits are appreciated.  Skin:  Skin is warm, dry and intact. No rash noted. Psychiatric: Mood and affect are normal. Speech and behavior are normal.  ____________________________________________   LABS (all  labs ordered are listed, but only abnormal results are displayed)  Labs Reviewed  CBC WITH DIFFERENTIAL/PLATELET - Abnormal; Notable for the following components:      Result Value   MCV 100.7 (*)    MCH 35.4 (*)    All other components within normal limits  COMPREHENSIVE METABOLIC PANEL - Abnormal; Notable for the following components:   Sodium 128 (*)    Chloride 93 (*)    Glucose, Bld 103 (*)    Calcium 8.6 (*)    Total Protein 9.2 (*)    Albumin 3.1 (*)    Alkaline Phosphatase 180 (*)    Total Bilirubin 1.8 (*)    All other components within normal limits  CULTURE, BLOOD (ROUTINE X 2)  CULTURE, BLOOD (ROUTINE X 2)  ACID FAST SMEAR (AFB)  ACID FAST CULTURE WITH REFLEXED SENSITIVITIES  LACTIC ACID, PLASMA  PROCALCITONIN  INFLUENZA PANEL BY PCR (TYPE A & B)  TSH  HIV ANTIBODY (ROUTINE TESTING W REFLEX)  QUANTIFERON-TB GOLD PLUS   ____________________________________________  EKG  ED ECG REPORT I, Hinda Kehr, the attending physician, personally viewed and interpreted this ECG.  Date: 03/17/2018 EKG Time: 18: 20 Rate: 94 Rhythm: normal sinus rhythm QRS Axis: normal Intervals: borderline LVH, normal ST/T Wave abnormalities: Non-specific ST segment / T-wave changes, but no clear evidence of acute ischemia. Narrative Interpretation: no definitive evidence of acute ischemia; does not meet STEMI criteria.   ____________________________________________  RADIOLOGY I, Hinda Kehr, personally viewed and evaluated these images (plain radiographs) as part of my medical decision making, as well as reviewing the written report by the radiologist.  ED MD interpretation: Chest x-ray demonstrates a new cavitary lesion in the right upper lobe.  CT chest is concerning for the cavitary lesion that could represent pneumonia but could also be consistent with tuberculosis or even a concealed neoplasm.  Official radiology report(s): Dg Chest 2 View  Result Date: 03/17/2018 CLINICAL  DATA:  Shortness of breath, bloody sputum EXAM: CHEST - 2 VIEW COMPARISON:  01/31/2017 FINDINGS: Airspace consolidation noted in the right upper lobe with cavitation. This is new since prior study concerning for cavitary pneumonia. Left lung clear. Previously seen opacity at the right lung base has resolved. Heart is normal size. No effusions or acute bony abnormality. IMPRESSION: New cavitary airspace disease in the right upper lobe concerning for cavitary pneumonia. Electronically Signed   By: Rolm Baptise M.D.   On: 03/17/2018 18:53   Ct Chest W Contrast  Result Date: 03/18/2018 CLINICAL DATA:  Cough and congestion for the past week. Bloody sputum. EXAM: CT CHEST WITH CONTRAST TECHNIQUE: Multidetector CT imaging of the chest was performed during intravenous contrast administration. CONTRAST:  37mL OMNIPAQUE IOHEXOL 300 MG/ML  SOLN COMPARISON:  Chest radiograph 03/17/2018 FINDINGS: Cardiovascular: No significant vascular findings. Normal heart size. No pericardial effusion. Mediastinum/Nodes: No significant lymphadenopathy. Esophagus is decompressed. Lungs/Pleura: Emphysematous changes in the  lungs. Focal area of airspace consolidation in the inferior right upper lung with central cavitation. This likely represents a cavitary or necrotizing pneumonia. TB or atypical infection could also have this appearance in the appropriate clinical setting. Follow-up after resolution of acute process is recommended to exclude underlying neoplasm. Nodular opacity in the right posterior costophrenic angle measuring 10 mm diameter. This is probably atelectasis or scarring but this could be also evaluated on follow-up. Left lung is clear. No pleural effusions. No pneumothorax. Upper Abdomen: No acute abnormalities. Musculoskeletal: No chest wall abnormality. No acute or significant osseous findings. IMPRESSION: 1. Focal area of airspace consolidation in the inferior right upper lung with central cavitation. This likely  represents a cavitary or necrotizing pneumonia. TB or atypical infection could also have this appearance in the appropriate clinical setting. Follow-up after resolution of acute process is recommended to exclude underlying neoplasm. 2. Emphysematous changes in the lungs. 3. 10 mm nodular opacity in the right posterior costophrenic angle is probably atelectasis or scarring but could be also evaluated on follow-up. Emphysema (ICD10-J43.9). Electronically Signed   By: Lucienne Capers M.D.   On: 03/18/2018 02:40    ____________________________________________   PROCEDURES   Procedure(s) performed (including Critical Care):  Procedures   ____________________________________________   INITIAL IMPRESSION / MDM / [place] / ED COURSE  As part of my medical decision making, I reviewed the following data within the [place] notes reviewed and incorporated, Labs reviewed , EKG interpreted , Old chart reviewed, Radiograph reviewed , Discussed with admitting physician (Dr. Jannifer Franklin), Discussed with Infectious Disease (Dr. Baxter Flattery) and Notes from prior ED visits       Differential diagnosis includes, but is not limited to, community-acquired pneumonia including MRSA pneumonia, possibly in the setting of recent viral illness including influenza, tuberculosis, empyema, neoplasm.  The patient is well-appearing and in no distress.  He has no known medical issues of which she is aware.  However he spent 16 months in prison 3 years ago and also reports a prior diagnosis of tuberculosis for which he reports he was fully treated.  He has a new cavitary lesion in his right upper lobe and is having hemoptysis with all the risk factors described above.  We are moving him to a negative pressure room and will institute airborne precautions.  I have paged infectious disease at about 12:30 AM to discuss.  I have ordered HIV antibody, influenza panel, CBC with differential,  comprehensive metabolic panel, lactic acid, procalcitonin, and blood cultures.  I will hold off on empiric antibiotics until I speak with infectious disease.  The patient is not septic and is actually quite well-appearing and relatively asymptomatic at this time.  The hemoptysis he is experiencing appears relatively mild.  I anticipate getting a CT scan of his chest but again will try to talk to infectious disease before ordering any additional advanced imaging.  Clinical Course as of Mar 18 750  Tue Mar 18, 2018  0106 My secretary is paged infectious disease several times we have not heard back.  We will continue to try to reach Dr. Delaine Lame.  Proceeding with workup.    [CF]  0107 CBC within normal limits with no leukocytosis.   [CF]  0107 Lactic Acid, Venous: 1.1 [CF]  0146 Procalcitonin: 0.10 [CF]  0156 I spoke by phone with Dr. Baxter Flattery with [facility].  We discussed case in detail and she encouraged me to admit the patient for further TB work-up.  She agreed with my plan to start ceftriaxone and vancomycin for possible MRSA or community-acquired pneumonia.  Also discussed CT chest w/ IV contrast with which she agreed.  Will discuss with hospitalist.   [CF]  0206 Ordered AFB panel and QuantiFERON TB Gold Plus in addition to ceftriaxone 1 g IV and vancomycin 20 mg/kg loading dose.  I updated the patient about the plan and he agrees.  Verified airborne precautions and negative pressure room.  Hospitalist has been paged at 2:00 AM to discuss the admission.   [CF]  0220 I discussed the case by phone with Dr. Jannifer Franklin with the hospitalist service who will pass along the case for admission to Dr. Marcille Blanco.   [CF]    Clinical Course User Index [CF] Hinda Kehr, MD    ____________________________________________  FINAL CLINICAL IMPRESSION(S) / ED DIAGNOSES  Final diagnoses:  Cavitary pneumonia  Hemoptysis  Hyponatremia  Elevated alkaline phosphatase level  Serum total  bilirubin elevated     MEDICATIONS GIVEN DURING THIS VISIT:  Medications  enoxaparin (LOVENOX) injection 40 mg (40 mg Subcutaneous Given 03/18/18 0537)  acetaminophen (TYLENOL) tablet 650 mg (has no administration in time range)    Or  acetaminophen (TYLENOL) suppository 650 mg (has no administration in time range)  docusate sodium (COLACE) capsule 100 mg (has no administration in time range)  ondansetron (ZOFRAN) tablet 4 mg (has no administration in time range)    Or  ondansetron (ZOFRAN) injection 4 mg (has no administration in time range)  Influenza vac split quadrivalent PF (FLUARIX) injection 0.5 mL (has no administration in time range)  pneumococcal 23 valent vaccine (PNU-IMMUNE) injection 0.5 mL (has no administration in time range)  cefTRIAXone (ROCEPHIN) 1 g in sodium chloride 0.9 % 100 mL IVPB (0 g Intravenous Stopped 03/18/18 0434)  vancomycin (VANCOCIN) 1,500 mg in sodium chloride 0.9 % 500 mL IVPB (0 mg Intravenous Stopped 03/18/18 0525)  iohexol (OMNIPAQUE) 300 MG/ML solution 75 mL (75 mLs Intravenous Contrast Given 03/18/18 0227)     ED Discharge Orders    None       Note:  This document was prepared using Dragon voice recognition software and may include unintentional dictation errors.   Hinda Kehr, MD 03/18/18 [phone]

## 2018-03-18 NOTE — Care Management (Addendum)
Patient requested that [facility] send a letter to the bank showing that the patient is currently inpatient.  The fax number to send the letter is [phone].Patient approved the letter before being faxed.  Letter faxed

## 2018-03-18 NOTE — Progress Notes (Signed)
Assumed care of patient at 1530. Patient alert and oriented. Denies any pain. Prior RN sent sputum culture. Still in need of afb x3. Night nurse made aware and cup given to patient.

## 2018-03-18 NOTE — ED Notes (Signed)
Patient transported to CT 

## 2018-03-19 DIAGNOSIS — R918 Other nonspecific abnormal finding of lung field: Secondary | ICD-10-CM

## 2018-03-19 DIAGNOSIS — B20 Human immunodeficiency virus [HIV] disease: Secondary | ICD-10-CM

## 2018-03-19 DIAGNOSIS — E43 Unspecified severe protein-calorie malnutrition: Secondary | ICD-10-CM

## 2018-03-19 LAB — T-HELPER CELLS CD4/CD8 %
% CD 4 Pos. Lymph.: 18.3 % — ABNORMAL LOW (ref 30.8–58.5)
Absolute CD 4 Helper: 403 /uL (ref 359–1519)
BASOS: 0 %
Basophils Absolute: 0 10*3/uL (ref 0.0–0.2)
CD3+CD4+ Cells/CD3+CD8+ Cells Bld: 0.38 — ABNORMAL LOW (ref 0.92–3.72)
CD3+CD8+ Cells # Bld: 1058 /uL — ABNORMAL HIGH (ref 109–897)
CD3+CD8+ Cells NFr Bld: 48.1 % — ABNORMAL HIGH (ref 12.0–35.5)
EOS (ABSOLUTE): 0 10*3/uL (ref 0.0–0.4)
Eos: 0 %
HEMATOCRIT: 41.6 % (ref 37.5–51.0)
Hemoglobin: 14.4 g/dL (ref 13.0–17.7)
Immature Grans (Abs): 0.1 10*3/uL (ref 0.0–0.1)
Immature Granulocytes: 1 %
Lymphocytes Absolute: 2.2 10*3/uL (ref 0.7–3.1)
Lymphs: 29 %
MCH: 35 pg — ABNORMAL HIGH (ref 26.6–33.0)
MCHC: 34.6 g/dL (ref 31.5–35.7)
MCV: 101 fL — ABNORMAL HIGH (ref 79–97)
MONOS ABS: 1 10*3/uL — AB (ref 0.1–0.9)
Monocytes: 13 %
Neutrophils Absolute: 4.4 10*3/uL (ref 1.4–7.0)
Neutrophils: 57 %
Platelets: 324 10*3/uL (ref 150–450)
RBC: 4.11 x10E6/uL — ABNORMAL LOW (ref 4.14–5.80)
RDW: 12 % (ref 11.6–15.4)
WBC: 7.8 10*3/uL (ref 3.4–10.8)

## 2018-03-19 LAB — BASIC METABOLIC PANEL
Anion gap: 8 (ref 5–15)
BUN: 12 mg/dL (ref 6–20)
CO2: 23 mmol/L (ref 22–32)
Calcium: 7.8 mg/dL — ABNORMAL LOW (ref 8.9–10.3)
Chloride: 97 mmol/L — ABNORMAL LOW (ref 98–111)
Creatinine, Ser: 0.75 mg/dL (ref 0.61–1.24)
GFR calc Af Amer: >60 mL/min (ref >60)
Glucose, Bld: 90 mg/dL (ref 70–99)
Potassium: 3.6 mmol/L (ref 3.5–5.1)
Sodium: 128 mmol/L — ABNORMAL LOW (ref 135–145)

## 2018-03-19 LAB — MAGNESIUM: Magnesium: 1.5 mg/dL — ABNORMAL LOW (ref 1.7–2.4)

## 2018-03-19 LAB — HIV ANTIBODY (ROUTINE TESTING W REFLEX): HIV SCREEN 4TH GENERATION: REACTIVE — AB

## 2018-03-19 LAB — HIV-1 RNA QUANT-NO REFLEX-BLD
HIV 1 RNA Quant: 29000 copies/mL
LOG10 HIV-1 RNA: 4.462 log10copy/mL

## 2018-03-19 LAB — HIV 1/2 AB DIFFERENTIATION
HIV 1 Ab: POSITIVE — AB
HIV 2 Ab: NEGATIVE

## 2018-03-19 LAB — PHOSPHORUS: Phosphorus: 2.8 mg/dL (ref 2.5–4.6)

## 2018-03-19 MED ORDER — MAGNESIUM SULFATE 2 GM/50ML IV SOLN
2.0000 g | Freq: Once | INTRAVENOUS | Status: AC
  Administered 2018-03-19: 2 g via INTRAVENOUS
  Filled 2018-03-19: qty 50

## 2018-03-19 NOTE — Progress Notes (Signed)
Date of Admission:  03/17/2018    TODAY 03/19/18   ID: Joshua Dougherty is a 48 y.o. male  Active Problems:   Hemoptysis   Protein-calorie malnutrition, severe HIV CAVITARY LESION RT LUNG   Subjective: Says he is the same Frustrated that 3 sputum have not been collected for afb  Medications:  . enoxaparin (LOVENOX) injection  40 mg Subcutaneous Q24H  . feeding supplement (ENSURE ENLIVE)  237 mL Oral TID BM  . Influenza vac split quadrivalent PF  0.5 mL Intramuscular Tomorrow-1000  . pneumococcal 23 valent vaccine  0.5 mL Intramuscular Tomorrow-1000    Objective: Vital signs in last 24 hours: Temp:  [98.5 F (36.9 C)-98.8 F (37.1 C)] 98.7 F (37.1 C) (02/26 0750) Pulse Rate:  [70-75] 70 (02/26 0750) Resp:  [18] 18 (02/26 0750) BP: (130-139)/(87-90) 139/90 (02/26 0750) SpO2:  [98 %-100 %] 98 % (02/26 0750)  PHYSICAL EXAM:  General: Alert, cooperative, no distress, thin  Lab Results Recent Labs    03/18/18 0029 03/19/18 0458  WBC 8.9  --   HGB 16.2  --   HCT 46.1  --   NA 128* 128*  K 3.5 3.6  CL 93* 97*  CO2 27 23  BUN 13 12  CREATININE 0.84 0.75   Liver Panel Recent Labs    03/18/18 0029  PROT 9.2*  ALBUMIN 3.1*  AST 26  ALT 11  ALKPHOS 180*  BILITOT 1.8*   cd4 -is 403 ( 18%) Microbiology:  Studies/Results: Dg Chest 2 View  Result Date: 03/17/2018 CLINICAL DATA:  Shortness of breath, bloody sputum EXAM: CHEST - 2 VIEW COMPARISON:  01/31/2017 FINDINGS: Airspace consolidation noted in the right upper lobe with cavitation. This is new since prior study concerning for cavitary pneumonia. Left lung clear. Previously seen opacity at the right lung base has resolved. Heart is normal size. No effusions or acute bony abnormality. IMPRESSION: New cavitary airspace disease in the right upper lobe concerning for cavitary pneumonia. Electronically Signed   By: Rolm Baptise M.D.   On: 03/17/2018 18:53   Ct Chest W Contrast  Result Date: 03/18/2018 CLINICAL  DATA:  Cough and congestion for the past week. Bloody sputum. EXAM: CT CHEST WITH CONTRAST TECHNIQUE: Multidetector CT imaging of the chest was performed during intravenous contrast administration. CONTRAST:  10mL OMNIPAQUE IOHEXOL 300 MG/ML  SOLN COMPARISON:  Chest radiograph 03/17/2018 FINDINGS: Cardiovascular: No significant vascular findings. Normal heart size. No pericardial effusion. Mediastinum/Nodes: No significant lymphadenopathy. Esophagus is decompressed. Lungs/Pleura: Emphysematous changes in the lungs. Focal area of airspace consolidation in the inferior right upper lung with central cavitation. This likely represents a cavitary or necrotizing pneumonia. TB or atypical infection could also have this appearance in the appropriate clinical setting. Follow-up after resolution of acute process is recommended to exclude underlying neoplasm. Nodular opacity in the right posterior costophrenic angle measuring 10 mm diameter. This is probably atelectasis or scarring but this could be also evaluated on follow-up. Left lung is clear. No pleural effusions. No pneumothorax. Upper Abdomen: No acute abnormalities. Musculoskeletal: No chest wall abnormality. No acute or significant osseous findings. IMPRESSION: 1. Focal area of airspace consolidation in the inferior right upper lung with central cavitation. This likely represents a cavitary or necrotizing pneumonia. TB or atypical infection could also have this appearance in the appropriate clinical setting. Follow-up after resolution of acute process is recommended to exclude underlying neoplasm. 2. Emphysematous changes in the lungs. 3. 10 mm nodular opacity in the right posterior costophrenic angle is  probably atelectasis or scarring but could be also evaluated on follow-up. Emphysema (ICD10-J43.9). Electronically Signed   By: Lucienne Capers M.D.   On: 03/18/2018 02:40     Assessment/Plan: IMAGING RESULTS:  Focal area of airspace consolidation in the  inferior right upper lung with central cavitation. This likely represents a cavitary or necrotizing pneumonia. TB or atypical infection could also have this appearance in the appropriate clinical setting I have personally reviewed the films ? Impression/Recommendation ? ?48 yr male with h/o HIV presenting with cough and hemoptysis  Thick walled cavitary lesion rt lung- with h/o LTBI and HIV ( on no treatment currently) will need to r/o TB . D.D /necrotizing pneumonia / lung abscess / On unasyn  1 sputum has been sent for Afb so far- need 2 more. Informed his nurse to give cups for collection  HIV- Cd4 count is 408 ( 18%) -  not as immuno suppressed as I thought before. Viral load pending. If he has TB the treatment for TB will have to be started first .  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  Pulmonary consult will be needed if sputum afb comes back negative.  Discussed the management with the patient and his nurse

## 2018-03-19 NOTE — Progress Notes (Signed)
[facility] [place] NAME: Joshua [facility]    MR#:  [id number]  DATE OF BIRTH:  1970/06/20  SUBJECTIVE:  Patient here with cavitary lesion r/o TB Not good historian  REVIEW OF SYSTEMS:    Review of Systems  Constitutional: Negative for fever, chills +++weight loss HENT: Negative for ear pain, nosebleeds, congestion, facial swelling, rhinorrhea, neck pain, neck stiffness and ear discharge.   Respiratory:++ for cough, shortness of breath, no wheezing  Cardiovascular: Negative for chest pain, palpitations and leg swelling.  Gastrointestinal: Negative for heartburn, abdominal pain, vomiting, diarrhea or consitpation Genitourinary: Negative for dysuria, urgency, frequency, hematuria Musculoskeletal: Negative for back pain or joint pain Neurological: Negative for dizziness, seizures, syncope, focal weakness,  numbness and headaches.  Hematological: Does not bruise/bleed easily.  Psychiatric/Behavioral: Negative for hallucinations, confusion, dysphoric mood    Tolerating Diet: yes      DRUG ALLERGIES:  No Known Allergies  VITALS:  Blood pressure 139/90, pulse 70, temperature 98.7 F (37.1 C), temperature source Oral, resp. rate 18, height 6\' 4"  (1.93 m), weight 62.6 kg, SpO2 98 %.  PHYSICAL EXAMINATION:  Constitutional: Appears well-developed and well-nourished. No distress. HENT: Normocephalic. Marland Kitchen Oropharynx is clear and moist.  Eyes: Conjunctivae and EOM are normal. PERRLA, no scleral icterus.  Neck: Normal ROM. Neck supple. No JVD. No tracheal deviation. CVS: RRR, S1/S2 +, no murmurs, no gallops, no carotid bruit.  Pulmonary: Effort and breath sounds normal, no stridor, rhonchi, wheezes, rales.  Abdominal: Soft. BS +,  no distension, tenderness, rebound or guarding.  Musculoskeletal: Normal range of motion. No edema and no tenderness.  Neuro: Alert. CN 2-12 grossly intact. No focal deficits. Skin: Skin is warm and dry. No rash  noted. Psychiatric: Normal mood and affect.      LABORATORY PANEL:   CBC Recent Labs  Lab 03/18/18 0029  WBC 8.9  HGB 16.2  HCT 46.1  PLT 350   ------------------------------------------------------------------------------------------------------------------  Chemistries  Recent Labs  Lab 03/18/18 0029 03/19/18 0458  NA 128* 128*  K 3.5 3.6  CL 93* 97*  CO2 27 23  GLUCOSE 103* 90  BUN 13 12  CREATININE 0.84 0.75  CALCIUM 8.6* 7.8*  MG  --  1.5*  AST 26  --   ALT 11  --   ALKPHOS 180*  --   BILITOT 1.8*  --    ------------------------------------------------------------------------------------------------------------------  Cardiac Enzymes No results for input(s): TROPONINI in the last 168 hours. ------------------------------------------------------------------------------------------------------------------  RADIOLOGY:  Dg Chest 2 View  Result Date: 03/17/2018 CLINICAL DATA:  Shortness of breath, bloody sputum EXAM: CHEST - 2 VIEW COMPARISON:  01/31/2017 FINDINGS: Airspace consolidation noted in the right upper lobe with cavitation. This is new since prior study concerning for cavitary pneumonia. Left lung clear. Previously seen opacity at the right lung base has resolved. Heart is normal size. No effusions or acute bony abnormality. IMPRESSION: New cavitary airspace disease in the right upper lobe concerning for cavitary pneumonia. Electronically Signed   By: Rolm Baptise M.D.   On: 03/17/2018 18:53   Ct Chest W Contrast  Result Date: 03/18/2018 CLINICAL DATA:  Cough and congestion for the past week. Bloody sputum. EXAM: CT CHEST WITH CONTRAST TECHNIQUE: Multidetector CT imaging of the chest was performed during intravenous contrast administration. CONTRAST:  38mL OMNIPAQUE IOHEXOL 300 MG/ML  SOLN COMPARISON:  Chest radiograph 03/17/2018 FINDINGS: Cardiovascular: No significant vascular findings. Normal heart size. No pericardial effusion. Mediastinum/Nodes: No  significant lymphadenopathy. Esophagus is decompressed. Lungs/Pleura:  Emphysematous changes in the lungs. Focal area of airspace consolidation in the inferior right upper lung with central cavitation. This likely represents a cavitary or necrotizing pneumonia. TB or atypical infection could also have this appearance in the appropriate clinical setting. Follow-up after resolution of acute process is recommended to exclude underlying neoplasm. Nodular opacity in the right posterior costophrenic angle measuring 10 mm diameter. This is probably atelectasis or scarring but this could be also evaluated on follow-up. Left lung is clear. No pleural effusions. No pneumothorax. Upper Abdomen: No acute abnormalities. Musculoskeletal: No chest wall abnormality. No acute or significant osseous findings. IMPRESSION: 1. Focal area of airspace consolidation in the inferior right upper lung with central cavitation. This likely represents a cavitary or necrotizing pneumonia. TB or atypical infection could also have this appearance in the appropriate clinical setting. Follow-up after resolution of acute process is recommended to exclude underlying neoplasm. 2. Emphysematous changes in the lungs. 3. 10 mm nodular opacity in the right posterior costophrenic angle is probably atelectasis or scarring but could be also evaluated on follow-up. Emphysema (ICD10-J43.9). Electronically Signed   By: Lucienne Capers M.D.   On: 03/18/2018 02:40     ASSESSMENT AND PLAN:   48 year old male with history of HIV who presented to the emergency room due to shortness of breath, cough and hemoptysis.Marland Kitchen  1.  Thick-walled cavitary lesion with hemoptysis/pneumonia: Patient has been ruled out for TB ID consultation appreciated Continue Unasyn  2.  HIV: Patient has not been followed up with this diagnosis in 2012  Follow-up on HIV RNA/CD4  3.  Hyponatremia: This is from the cavitary lesion TSH normal  4.  Hypo-magnesium: Replete and recheck  in a.m.   Management plans discussed with the patient and he is in agreement.  CODE STATUS: full  TOTAL TIME TAKING CARE OF THIS PATIENT: 28 minutes.     POSSIBLE D/C 3 days, DEPENDING ON CLINICAL CONDITION.   Chaun Uemura M.D on 03/19/2018 at 11:32 AM  Between 7am to 6pm - Pager - [phone] After 6pm go to [email or web address] - password [email or web address] [facility]  Office  [phone]  CC: Primary care physician; Patient, No Pcp Per  Note: This dictation was prepared with Dragon dictation along with smaller phrase technology. Any transcriptional errors that result from this process are unintentional.

## 2018-03-19 NOTE — Progress Notes (Signed)
Sputum for AFB obtained and sent to lab.

## 2018-03-19 NOTE — Progress Notes (Signed)
Pt reminded to obtain sputum specimen. Instructed to notify nurse as soon as specimen obtained. Pt verbalizes understanding.

## 2018-03-19 NOTE — Consult Note (Signed)
PHARMACY CONSULT NOTE - FOLLOW UP  Pharmacy Consult for Electrolyte Monitoring and Replacement   Recent Labs: Potassium (mmol/L)  Date Value  03/19/2018 3.6   Magnesium (mg/dL)  Date Value  03/19/2018 1.5 (L)   Calcium (mg/dL)  Date Value  03/19/2018 7.8 (L)   Albumin (g/dL)  Date Value  03/18/2018 3.1 (L)   Phosphorus (mg/dL)  Date Value  03/19/2018 2.8   Sodium (mmol/L)  Date Value  03/19/2018 128 (L)    Assessment: Pharmacy consulted or electrolyte monitoring and replacement in 48 yo male admitted with thick walled cavitary lesion of right lung. Patient has PMH of HIV and LTBI ~ 30 yrs ago.   Goal of Therapy:  Electrolytes WNL  Plan:  2/26 Mg 2.5 - Will replace with Magnesium 2g IV x 1 dose.   Will recheck electrolytes w/AM labs and continue to replace electrolytes as needed.   Pernell Dupre, PharmD, BCPS Clinical Pharmacist 03/19/2018 1:08 PM

## 2018-03-20 LAB — BASIC METABOLIC PANEL
Anion gap: 7 (ref 5–15)
BUN: 12 mg/dL (ref 6–20)
CALCIUM: 8.2 mg/dL — AB (ref 8.9–10.3)
CO2: 26 mmol/L (ref 22–32)
Chloride: 98 mmol/L (ref 98–111)
Creatinine, Ser: 0.7 mg/dL (ref 0.61–1.24)
GFR calc Af Amer: >60 mL/min (ref >60)
GFR calc non Af Amer: >60 mL/min (ref >60)
Glucose, Bld: 97 mg/dL (ref 70–99)
Potassium: 3.6 mmol/L (ref 3.5–5.1)
SODIUM: 131 mmol/L — AB (ref 135–145)

## 2018-03-20 LAB — ACID FAST SMEAR (AFB, MYCOBACTERIA)
Acid Fast Smear: NEGATIVE
Acid Fast Smear: NEGATIVE

## 2018-03-20 LAB — MAGNESIUM: Magnesium: 1.9 mg/dL (ref 1.7–2.4)

## 2018-03-20 LAB — PHOSPHORUS: Phosphorus: 3.2 mg/dL (ref 2.5–4.6)

## 2018-03-20 NOTE — Progress Notes (Signed)
[facility] [place] NAME: Joshua [facility]    MR#:  [id number]  DATE OF BIRTH:  1970/09/21  SUBJECTIVE:  Patient angry today and mad   REVIEW OF SYSTEMS:    Review of Systems  Constitutional: Negative for fever, chills +++weight loss HENT: Negative for ear pain, nosebleeds, congestion, facial swelling, rhinorrhea, neck pain, neck stiffness and ear discharge.   Respiratory:no cough, no sob no wheezing  Cardiovascular: Negative for chest pain, palpitations and leg swelling.  Gastrointestinal: Negative for heartburn, abdominal pain, vomiting, diarrhea or consitpation Genitourinary: Negative for dysuria, urgency, frequency, hematuria Musculoskeletal: Negative for back pain or joint pain Neurological: Negative for dizziness, seizures, syncope, focal weakness,  numbness and headaches.  Hematological: Does not bruise/bleed easily.  Psychiatric/Behavioral: Negative for hallucinations, confusion, dysphoric mood    Tolerating Diet: yes      DRUG ALLERGIES:  No Known Allergies  VITALS:  Blood pressure 117/77, pulse 70, temperature 98.2 F (36.8 C), temperature source Oral, resp. rate 17, height 6\' 4"  (1.93 m), weight 62.6 kg, SpO2 98 %.  PHYSICAL EXAMINATION:  Constitutional: Appears well-developed and well-nourished. No distress. HENT: Normocephalic. Marland Kitchen Oropharynx is clear and moist.  Eyes: Conjunctivae and EOM are normal. PERRLA, no scleral icterus.  Neck: Normal ROM. Neck supple. No JVD. No tracheal deviation. CVS: RRR, S1/S2 +, no murmurs, no gallops, no carotid bruit.  Pulmonary: Effort and breath sounds normal, no stridor, rhonchi, wheezes, rales.  Abdominal: Soft. BS +,  no distension, tenderness, rebound or guarding.  Musculoskeletal: Normal range of motion. No edema and no tenderness.  Neuro: Alert. CN 2-12 grossly intact. No focal deficits. Skin: Skin is warm and dry. No rash noted. Psychiatric: Normal mood and affect.       LABORATORY PANEL:   CBC Recent Labs  Lab 03/18/18 1630  WBC 7.8  HGB 14.4  HCT 41.6  PLT 324   ------------------------------------------------------------------------------------------------------------------  Chemistries  Recent Labs  Lab 03/18/18 0029  03/20/18 0310  NA 128*   < > 131*  K 3.5   < > 3.6  CL 93*   < > 98  CO2 27   < > 26  GLUCOSE 103*   < > 97  BUN 13   < > 12  CREATININE 0.84   < > 0.70  CALCIUM 8.6*   < > 8.2*  MG  --    < > 1.9  AST 26  --   --   ALT 11  --   --   ALKPHOS 180*  --   --   BILITOT 1.8*  --   --    < > = values in this interval not displayed.   ------------------------------------------------------------------------------------------------------------------  Cardiac Enzymes No results for input(s): TROPONINI in the last 168 hours. ------------------------------------------------------------------------------------------------------------------  RADIOLOGY:  No results found.   ASSESSMENT AND PLAN:   48 year old male with history of HIV who presented to the emergency room due to shortness of breath, cough and hemoptysis.Marland Kitchen  1.  Thick-walled cavitary lesion with hemoptysis/pneumonia: Patient is being ruled out for TB ID consultation appreciated Continue Unasyn AFB times 3 collected as per patient Pulmonary consult needed if AFB are negative  2.  HIV:  CD4 408 Await viral load  3.  Hyponatremia: This is from the cavitary lesion/dehydration TSH normal  4.  Hypo-magnesium: Replete prn   Management plans discussed with the patient and he is in agreement.  CODE STATUS: full  TOTAL TIME TAKING CARE OF THIS PATIENT:  27 minutes.     POSSIBLE D/C 2-3 days, DEPENDING ON CLINICAL CONDITION.   Joyceline Maiorino M.D on 03/20/2018 at 10:58 AM  Between 7am to 6pm - Pager - [phone] After 6pm go to [email or web address] - password [email or web address] [facility]  Office  [phone]  CC: Primary care physician;  Patient, No Pcp Per  Note: This dictation was prepared with Dragon dictation along with smaller phrase technology. Any transcriptional errors that result from this process are unintentional.

## 2018-03-20 NOTE — Consult Note (Signed)
PHARMACY CONSULT NOTE - FOLLOW UP  Pharmacy Consult for Electrolyte Monitoring and Replacement   Recent Labs: Potassium (mmol/L)  Date Value  03/20/2018 3.6   Magnesium (mg/dL)  Date Value  03/20/2018 1.9   Calcium (mg/dL)  Date Value  03/20/2018 8.2 (L)   Albumin (g/dL)  Date Value  03/18/2018 3.1 (L)   Phosphorus (mg/dL)  Date Value  03/20/2018 3.2   Sodium (mmol/L)  Date Value  03/20/2018 131 (L)   Corr Ca: 8.9  Assessment: Pharmacy consulted or electrolyte monitoring and replacement in 48 yo male admitted with thick walled cavitary lesion of right lung. Patient has PMH of HIV and LTBI ~ 30 yrs ago.   Goal of Therapy:  Electrolytes WNL  Plan:  No need to replace today. Electrolytes WNL. Sodium is trending up.   Will recheck electrolytes w/AM labs and continue to replace electrolytes as needed.   Oswald Hillock, PharmD, BCPS Clinical Pharmacist 03/20/2018 12:07 PM

## 2018-03-21 DIAGNOSIS — R454 Irritability and anger: Secondary | ICD-10-CM

## 2018-03-21 LAB — QUANTIFERON-TB GOLD PLUS: QuantiFERON-TB Gold Plus: NEGATIVE

## 2018-03-21 LAB — ACID FAST SMEAR (AFB, MYCOBACTERIA)

## 2018-03-21 LAB — QUANTIFERON-TB GOLD PLUS (RQFGPL)
QuantiFERON Mitogen Value: 1.7 IU/mL
QuantiFERON Nil Value: 0.04 IU/mL
QuantiFERON TB1 Ag Value: 0.04 IU/mL
QuantiFERON TB2 Ag Value: 0.04 IU/mL

## 2018-03-21 LAB — BASIC METABOLIC PANEL
Anion gap: 9 (ref 5–15)
BUN: 12 mg/dL (ref 6–20)
CO2: 25 mmol/L (ref 22–32)
Calcium: 8.4 mg/dL — ABNORMAL LOW (ref 8.9–10.3)
Chloride: 98 mmol/L (ref 98–111)
Creatinine, Ser: 0.65 mg/dL (ref 0.61–1.24)
GFR calc Af Amer: >60 mL/min (ref >60)
GFR calc non Af Amer: >60 mL/min (ref >60)
GLUCOSE: 95 mg/dL (ref 70–99)
Potassium: 4.2 mmol/L (ref 3.5–5.1)
Sodium: 132 mmol/L — ABNORMAL LOW (ref 135–145)

## 2018-03-21 LAB — CULTURE, RESPIRATORY W GRAM STAIN: Culture: NORMAL

## 2018-03-21 LAB — ACID FAST SMEAR (AFB): ACID FAST SMEAR - AFSCU2: NEGATIVE

## 2018-03-21 MED ORDER — AMOXICILLIN-POT CLAVULANATE 875-125 MG PO TABS: 1.0000 | ORAL_TABLET | Freq: Two times a day (BID) | ORAL | 0 refills | Status: AC

## 2018-03-21 NOTE — Consult Note (Addendum)
Joshua Dougherty      Assessment and Plan:  48 year old HIV-positive male with history of treated TB remotely.  Now presents with acute hemoptysis, right upper lobe cavitary lesion.  Initial AFB negative x3. - We will plan for bronchoscopy with fluoroscopic guidance for brushings, lavage, transbronchial biopsies.  Addendum: Discussed with ID and hospitalist.  Will await results from nucleic acid amplification and culture.  Patient has a plan to follow-up with infectious disease in 2 weeks.  If all results are negative we will reconsider bronchoscopy.  Patient will follow-up with me in the office in about 2 weeks time for further planning.   Date: 03/21/2018  MRN# [id number] Joshua Dougherty 08-Jul-1970  Referring Physician: Dr. Tressia Miners.  Joshua Dougherty is a 48 y.o. old male seen in [facility] for chief complaint of:    Chief Complaint  Patient presents with  . Hemoptysis    HPI:  The patient is a 48 year old male with a prior history of tuberculosis several years ago, treated with a 4-month course of antituberculous therapy.  His history includes incarceration.  Patient presented with acute onset of cough over about 12 hours of developed into hemoptysis.  He felt minimal dyspnea at that time, denied fever.  He was incarcerated about 3 years ago for 16 months, tells me that he has had TB skin test which have been negative.  ID service was consulted, and noted that the patient had been tested positive for HIV in 2012  Review of lab testing shows TB QuantiFERON negative on 2/25.  Influenza a and B, negative.  HIV 1 antibody positive.  AFB negative x3.  CT chest 03/18/2018>> imaging personally reviewed.  There is a large cavitary lesion in the right upper lobe, posterior segment.  Appears to be some intracavitary material, try to by area of interstitial change, which may represent blood.  There are apical emphysematous changes.   PMHX:   History reviewed. No  pertinent past medical history. Surgical Hx:  Past Surgical History:  Procedure Laterality Date  . ABSCESS DRAINAGE  2013   chest wall; patient states it was not contiguous with his lung however this was an operation under general anesthesia   Family Hx:  History reviewed. No pertinent family history. Social Hx:   Social History   Tobacco Use  . Smoking status: Current Every Day Smoker  . Smokeless tobacco: Current User  Substance Use Topics  . Alcohol use: Yes    Frequency: Never  . Drug use: No   Medication:    Current Facility-Administered Medications:  .  acetaminophen (TYLENOL) tablet 650 mg, 650 mg, Oral, Q6H PRN **OR** acetaminophen (TYLENOL) suppository 650 mg, 650 mg, Rectal, Q6H PRN, Harrie Foreman, MD .  Ampicillin-Sulbactam (UNASYN) 3 g in sodium chloride 0.9 % 100 mL IVPB, 3 g, Intravenous, Q6H, Ravishankar, Jayashree, MD, Last Rate: 200 mL/hr at 03/21/18 0639, 3 g at 03/21/18 0639 .  bisacodyl (DULCOLAX) EC tablet 5 mg, 5 mg, Oral, Daily PRN, Demetrios Loll, MD .  enoxaparin (LOVENOX) injection 40 mg, 40 mg, Subcutaneous, Q24H, Harrie Foreman, MD, 40 mg at 03/18/18 0537 .  feeding supplement (ENSURE ENLIVE) (ENSURE ENLIVE) liquid 237 mL, 237 mL, Oral, TID BM, Demetrios Loll, MD, 237 mL at 03/21/18 0054 .  guaiFENesin (ROBITUSSIN) 100 MG/5ML solution 100 mg, 5 mL, Oral, Q4H PRN, Demetrios Loll, MD, 100 mg at 03/18/18 1143 .  ipratropium-albuterol (DUONEB) 0.5-2.5 (3) MG/3ML nebulizer solution 3 mL, 3 mL, Nebulization, Q4H PRN, Demetrios Loll, MD .  ondansetron (ZOFRAN) tablet 4 mg, 4 mg, Oral, Q6H PRN **OR** ondansetron (ZOFRAN) injection 4 mg, 4 mg, Intravenous, Q6H PRN, Harrie Foreman, MD .  senna [facility]) tablet 8.6 mg, 1 tablet, Oral, Daily PRN, Demetrios Loll, MD   Allergies:  Patient has no known allergies.  Review of Systems: Gen:  Denies  fever, sweats, chills HEENT: Denies blurred vision, double vision. bleeds, sore throat Cvc:  No dizziness, chest pain. Resp:    Denies cough or sputum production, shortness of breath Gi: Denies swallowing difficulty, stomach pain. Gu:  Denies bladder incontinence, burning urine Ext:   No Joint pain, stiffness. Skin: No skin rash,  hives  Endoc:  No polyuria, polydipsia. Psych: No depression, insomnia. Other:  All other systems were reviewed with the patient and were negative other that what is mentioned in the HPI.   Physical Examination:   VS: BP 119/85 (BP Location: Right Arm)   Pulse 65   Temp 98.2 F (36.8 C) (Oral)   Resp 18   Ht 6\' 4"  (1.93 m)   Wt 62.6 kg   SpO2 98%   BMI 16.80 kg/m   General Appearance: No distress  Neuro:without focal findings,  speech normal,  HEENT: PERRLA, EOM intact.   Pulmonary: normal breath sounds, No wheezing.  CardiovascularNormal S1,S2.  No m/r/g.   Abdomen: Benign, Soft, non-tender. Renal:  No costovertebral tenderness  GU:  No performed at this time. Endoc: No evident thyromegaly, no signs of acromegaly. Skin:   warm, no rashes, no ecchymosis  Extremities: normal, no cyanosis, clubbing.  Other findings:    LABORATORY PANEL:   CBC Recent Labs  Lab 03/18/18 1630  WBC 7.8  HGB 14.4  HCT 41.6  PLT 324   ------------------------------------------------------------------------------------------------------------------  Chemistries  Recent Labs  Lab 03/18/18 0029  03/20/18 0310 03/21/18 0626  NA 128*   < > 131* 132*  K 3.5   < > 3.6 4.2  CL 93*   < > 98 98  CO2 27   < > 26 25  GLUCOSE 103*   < > 97 95  BUN 13   < > 12 12  CREATININE 0.84   < > 0.70 0.65  CALCIUM 8.6*   < > 8.2* 8.4*  MG  --    < > 1.9  --   AST 26  --   --   --   ALT 11  --   --   --   ALKPHOS 180*  --   --   --   BILITOT 1.8*  --   --   --    < > = values in this interval not displayed.   ------------------------------------------------------------------------------------------------------------------  Cardiac Enzymes No results for input(s): TROPONINI in the last 168  hours. ------------------------------------------------------------  RADIOLOGY:  No results found.     Thank  you for the [facility] and for allowing [facility] to assist in the care of your patient. Our recommendations are noted above.  Please contact us if we can be of further service.   [name], M.D., F.C.C.P.  Board Certified in Internal Medicine, Pulmonary Medicine, [place], and Sleep Medicine.  [facility] Office Number: [phone]   03/21/2018

## 2018-03-21 NOTE — Discharge Summary (Signed)
[name] NAME: Joshua Dougherty    MR#:  [id number]  DATE OF BIRTH:  [birth year]  DATE OF ADMISSION:  03/17/2018 ADMITTING PHYSICIAN: Joshua Foreman, MD  DATE OF DISCHARGE: 2/28/202  PRIMARY CARE PHYSICIAN: Patient, No Pcp Per    ADMISSION DIAGNOSIS:  Hyponatremia [E87.1] Serum total bilirubin elevated [R17] Hemoptysis [R04.2] Elevated alkaline phosphatase level [R74.8] Cavitary pneumonia [J18.9, J98.4]  DISCHARGE DIAGNOSIS:  Active Problems:   Hemoptysis   Protein-calorie malnutrition, severe   SECONDARY DIAGNOSIS:  hiv    48 year old male with history of HIV who presented to the emergency room due to shortness of breath, cough and hemoptysis.Marland Kitchen  1.  Thick-walled cavitary lesion with hemoptysis/pneumonia:  She was evaluated by pulmonary as well as infectious disease consultants.  AFB x3 are negative.  PCR is pending.  Patient was asked to wear a mask while out in public and will be followed by health department.  Patient will follow-up with Dr. Ashby Dougherty early next week and may have outpatient bronchoscopy.   He will be discharged on oral Augmentin for pneumonia/right upper cavitary lesion   2.  HIV:  CD4 408 HIV 1 RNA quantitative is 29,000 He will follow-up with Dr. Levester Dougherty as an outpatient  3.  Hyponatremia: This is from the cavitary lesion/dehydration TSH normal  4.  Hypo-magnesium: Replete prn   DISCHARGE CONDITIONS AND DIET:  Regular diet Stable   CONSULTS OBTAINED:  Treatment Team:  Joshua Billing, MD  DRUG ALLERGIES:  No Known Allergies  DISCHARGE MEDICATIONS:   Allergies as of 03/21/2018   No Known Allergies     Medication List    TAKE these medications   amoxicillin-clavulanate 875-125 MG tablet Commonly known as:  AUGMENTIN Take 1 tablet by mouth 2 (two) times daily for 12 days.         Today   CHIEF COMPLAINT:  Patient angry again wants to leave  hospital   VITAL SIGNS:  Blood pressure 119/85, pulse 65, temperature 98.2 F (36.8 C), temperature source Oral, resp. rate 18, height 6\' 4"  (1.93 m), weight 62.6 kg, SpO2 98 %.   REVIEW OF SYSTEMS:  Review of Systems  Constitutional: Negative.  Negative for chills, fever and malaise/fatigue.  HENT: Negative.  Negative for ear discharge, ear pain, hearing loss, nosebleeds and sore throat.   Eyes: Negative.  Negative for blurred vision and pain.  Respiratory: Negative.  Negative for cough, hemoptysis, shortness of breath and wheezing.   Cardiovascular: Negative.  Negative for chest pain, palpitations and leg swelling.  Gastrointestinal: Negative.  Negative for abdominal pain, blood in stool, diarrhea, nausea and vomiting.  Genitourinary: Negative.  Negative for dysuria.  Musculoskeletal: Negative.  Negative for back pain.  Skin: Negative.   Neurological: Negative for dizziness, tremors, speech change, focal weakness, seizures and headaches.  Endo/Heme/Allergies: Negative.  Does not bruise/bleed easily.  Psychiatric/Behavioral: Negative.  Negative for depression, hallucinations and suicidal ideas.     PHYSICAL EXAMINATION:  GENERAL:  48 y.o.-year-old patient lying in the bed with no acute distress.  NECK:  Supple, no jugular venous distention. No thyroid enlargement, no tenderness.  LUNGS: Normal breath sounds bilaterally, no wheezing, rales,rhonchi  No use of accessory muscles of respiration.  CARDIOVASCULAR: S1, S2 normal. No murmurs, rubs, or gallops.  ABDOMEN: Soft, non-tender, non-distended. Bowel sounds present. No organomegaly or mass.  EXTREMITIES: No pedal edema, cyanosis, or clubbing.  PSYCHIATRIC: The patient is alert and oriented x 3.  SKIN: No obvious  rash, lesion, or ulcer.   DATA REVIEW:   CBC Recent Labs  Lab 03/18/18 1630  WBC 7.8  HGB 14.4  HCT 41.6  PLT 324    Chemistries  Recent Labs  Lab 03/18/18 0029  03/20/18 0310 03/21/18 0626  NA 128*   < >  131* 132*  K 3.5   < > 3.6 4.2  CL 93*   < > 98 98  CO2 27   < > 26 25  GLUCOSE 103*   < > 97 95  BUN 13   < > 12 12  CREATININE 0.84   < > 0.70 0.65  CALCIUM 8.6*   < > 8.2* 8.4*  MG  --    < > 1.9  --   AST 26  --   --   --   ALT 11  --   --   --   ALKPHOS 180*  --   --   --   BILITOT 1.8*  --   --   --    < > = values in this interval not displayed.    Cardiac Enzymes No results for input(s): TROPONINI in the last 168 hours.  Microbiology Results  @[email or web address] @  RADIOLOGY:  No results found.    Allergies as of 03/21/2018   No Known Allergies     Medication List    TAKE these medications   amoxicillin-clavulanate 875-125 MG tablet Commonly known as:  AUGMENTIN Take 1 tablet by mouth 2 (two) times daily for 12 days.         Management plans discussed with the patient and he is in agreement. Stable for discharge home  Patient should follow up with ID and PULM and health Dept  CODE STATUS:     Code Status Orders  (From admission, onward)         Start     Ordered   03/18/18 0510  Full code  Continuous     03/18/18 0510        Code Status History    This patient has a current code status but no historical code status.      TOTAL TIME TAKING CARE OF THIS PATIENT: 39 minutes.    Note: This dictation was prepared with Dragon dictation along with smaller phrase technology. Any transcriptional errors that result from this process are unintentional.  Joshua Dougherty M.D on 03/21/2018 at 10:54 AM  Between 7am to 6pm - Pager - [phone] After 6pm go to [email or web address] - password [email or web address] [facility]  Office  [phone]  CC: Primary care physician; Patient, No Pcp Per

## 2018-03-21 NOTE — Progress Notes (Signed)
Date of Admission:  03/17/2018      ID: Joshua Dougherty is a 48 y.o. male  Active Problems:   Hemoptysis   Protein-calorie malnutrition, severe HIV Cavitary lesion rt lung   Subjective: Pt angry at not being given all the information of his AFB test results which has been trickling in and only all three were available today. He is upset that the physicians are all asking the same question  Medications:  . enoxaparin (LOVENOX) injection  40 mg Subcutaneous Q24H  . feeding supplement (ENSURE ENLIVE)  237 mL Oral TID BM    Objective: Vital signs in last 24 hours: Temp:  [98.2 F (36.8 C)-98.3 F (36.8 C)] 98.2 F (36.8 C) (02/28 0748) Pulse Rate:  [65-68] 65 (02/28 0748) Resp:  [18] 18 (02/28 0748) BP: (119-120)/(79-85) 119/85 (02/28 0748) SpO2:  [98 %] 98 % (02/28 0748)  PHYSICAL EXAM:  Sitting in chair- angry Lab Results Recent Labs    03/18/18 1630  03/20/18 0310 03/21/18 0626  WBC 7.8  --   --   --   HGB 14.4  --   --   --   HCT 41.6  --   --   --   NA  --    < > 131* 132*  K  --    < > 3.6 4.2  CL  --    < > 98 98  CO2  --    < > 26 25  BUN  --    < > 12 12  CREATININE  --    < > 0.70 0.65   < > = values in this interval not displayed.   Liver Panel No results for input(s): PROT, ALBUMIN, AST, ALT, ALKPHOS, BILITOT, BILIDIR, IBILI in the last 72 hours. Sedimentation Rate No results for input(s): ESRSEDRATE in the last 72 hours. C-Reactive Protein No results for input(s): CRP in the last 72 hours.  Microbiology: Sputum for AFB smear  X 3 neg  IMAGING RESULTS:  Focal area of airspace consolidation in the inferior right upper lung with central cavitation. This likely represents a cavitary or necrotizing pneumonia. TB or atypical infection could also have this appearance in the appropriate clinical setting I have personally reviewed the films ? Impression/Recommendation ? ?48 yr male with h/o HIV presenting with cough and hemoptysis  Thick walled  cavitary lesion rt lung- with h/o LTBI and HIV ( on no treatment currently) will need to r/o TB . D.D /necrotizing pneumonia / lung abscess / On unasyn- can be changed to augmentin for 2 more weeks  3 sputum AFB smear is neagtive- but still that does not r/o TB- asked for NAAT to check for MTB   HIV- Cd4 count is 408 ( 18%) -  not as immuno suppressed as I thought before. Viral load around 15k . If he has TB the treatment for TB will have to be started first .  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  Pulmonary consult obtained for possible bronch as OP Discussed with IP who discussed with Health Dept. Pt can be discharged with a mask and he cannot go out in public until the NAAT is back.  Informed patient of the plan and he is willing to comply with it and happy that he is going home  but was angry at how things were or not communicated to him.  He will follow up with me as OP in 2 weeks [phone]  IF  NAAT neg he will need bronchoscopy Health dept will follow up with him as well

## 2018-03-21 NOTE — Consult Note (Signed)
PHARMACY CONSULT NOTE - FOLLOW UP  Pharmacy Consult for Electrolyte Monitoring and Replacement   Recent Labs: Potassium (mmol/L)  Date Value  03/21/2018 4.2   Magnesium (mg/dL)  Date Value  03/20/2018 1.9   Calcium (mg/dL)  Date Value  03/21/2018 8.4 (L)   Albumin (g/dL)  Date Value  03/18/2018 3.1 (L)   Phosphorus (mg/dL)  Date Value  03/20/2018 3.2   Sodium (mmol/L)  Date Value  03/21/2018 132 (L)   Corr Ca: 8.9  Assessment: Pharmacy consulted or electrolyte monitoring and replacement in 48 yo male admitted with thick walled cavitary lesion of right lung. Patient has PMH of HIV and LTBI ~ 30 yrs ago.   Goal of Therapy:  Electrolytes WNL  Plan:  No replacement need this morning. Electrolytes WNL. Sodium is trending up.   Will recheck electrolytes w/AM labs and continue to replace electrolytes as needed.   Pernell Dupre, PharmD, BCPS Clinical Pharmacist 03/21/2018 7:44 AM

## 2018-03-22 LAB — FUNGITELL, SERUM: Fungitell Result: 48 pg/mL (ref <80)

## 2018-03-23 LAB — CULTURE, BLOOD (ROUTINE X 2)
CULTURE: NO GROWTH
Culture: NO GROWTH

## 2018-03-23 LAB — FUNGAL ANTIBODIES PANEL, ID-BLOOD
ASPERGILLUS FUMIGATUS IGG: NEGATIVE
Aspergillus flavus: NEGATIVE
Aspergillus niger: NEGATIVE
Blastomyces Abs, Qn, DID: NEGATIVE
Histoplasma Ab, Immunodiffusion: NEGATIVE

## 2018-03-24 LAB — HIV 1/2 AB DIFFERENTIATION
HIV 1 Ab: POSITIVE — AB
HIV 2 Ab: NEGATIVE

## 2018-03-24 LAB — HIV ANTIBODY (ROUTINE TESTING W REFLEX): HIV Screen 4th Generation wRfx: REACTIVE — AB

## 2018-03-24 LAB — MTB NAA WITHOUT AFB CULTURE: M Tuberculosis, Naa: NEGATIVE

## 2018-03-26 ENCOUNTER — Telehealth: Payer: Self-pay | Admitting: Internal Medicine

## 2018-03-26 NOTE — H&P (View-Only) (Signed)
* [facility]     Assessment and Plan:  48 year old HIV-positive male with history of treated TB remotely.  Now presents with acute hemoptysis, right upper lobe cavitary lesion.  Initial AFB negative x3. - Case is been discussed with infectious disease, and [place]. We will plan for bronchoscopy with fluoroscopic guidance for brushings, lavage. - Patient will follow-up with infectious disease for results.  Date: 03/26/2018  MRN# [id number] Jaime Grizzell [birth year]   Savan Ruta is a 48 y.o. old male seen in follow up for chief complaint of  Chief Complaint  Patient presents with  . Hospitalization Follow-up    doing well since discharge. occ prod cough with clear mucus.      HPI:  The patient is a 48 year old HIV-positive male, with a history of remote TB.  The patient presented to the hospital with acute hemoptysis which resolved.  However CT showed large right upper lobe cavitary lesion.  Sputum AFB negative x3, nucleic acid negative, cultures pending.  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 He has has not had issues with breathing or hemoptysis. He continues to have an occasional cough.  He is is smoking 5 cigs per day, he has not quit in the past. He would like to quit he has not quit in the past.  He does not have insurance right now.  He is upset about all the testing that he is going through and he is not able to work and having difficulty paying his bills.   Medication:    Current Outpatient Medications:  .  amoxicillin-clavulanate (AUGMENTIN) 875-125 MG tablet, Take 1 tablet by mouth 2 (two) times daily for 12 days., Disp: 24 tablet, Rfl: 0   Allergies:  Patient has no known allergies.   Review of Systems:  Constitutional: Feels well. Cardiovascular: Denies chest pain, exertional chest pain.  Pulmonary: Denies hemoptysis, pleuritic chest pain.   The remainder of systems were reviewed  and were found to be negative other than what is documented in the HPI.    Physical Examination:   VS: BP 122/64 (BP Location: Left Arm, Cuff Size: Normal)   Pulse 85   Ht 6\' 4"  (1.93 m)   Wt 147 lb 3.2 oz (66.8 kg)   SpO2 98%   BMI 17.92 kg/m   General Appearance: No distress  Neuro:without focal findings, mental status, speech normal, alert and oriented HEENT: PERRLA, EOM intact Pulmonary: No wheezing, No rales  CardiovascularNormal S1,S2.  No m/r/g.  Abdomen: Benign, Soft, non-tender, No masses Renal:  No costovertebral tenderness  GU:  No performed at this time. Endoc: No evident thyromegaly, no signs of acromegaly or Cushing features Skin:   warm, no rashes, no ecchymosis  Extremities: normal, no cyanosis, clubbing.     LABORATORY PANEL:   CBC No results for input(s): WBC, HGB, HCT, PLT in the last 168 hours. ------------------------------------------------------------------------------------------------------------------  Chemistries  Recent Labs  Lab 03/20/18 0310 03/21/18 0626  NA 131* 132*  K 3.6 4.2  CL 98 98  CO2 26 25  GLUCOSE 97 95  BUN 12 12  CREATININE 0.70 0.65  CALCIUM 8.2* 8.4*  MG 1.9  --    ------------------------------------------------------------------------------------------------------------------  Cardiac Enzymes No results for input(s): TROPONINI in the last 168 hours. ------------------------------------------------------------  RADIOLOGY:   No results found for this or any previous visit. Results for orders placed during the hospital encounter of 03/17/18  DG Chest 2 View   Narrative CLINICAL  DATA:  Shortness of breath, bloody sputum  EXAM: CHEST - 2 VIEW  COMPARISON:  01/31/2017  FINDINGS: Airspace consolidation noted in the right upper lobe with cavitation. This is new since prior study concerning for cavitary pneumonia. Left lung clear. Previously seen opacity at the right lung base has resolved. Heart is normal  size. No effusions or acute bony abnormality.  IMPRESSION: New cavitary airspace disease in the right upper lobe concerning for cavitary pneumonia.   Electronically Signed   By: Rolm Baptise M.D.   On: 03/17/2018 18:53    ------------------------------------------------------------------------------------------------------------------  Thank  you for allowing [facility] to assist in the care of your patient. Our recommendations are noted above.  Please contact us if we can be of further service.   [name], M.D., F.C.C.P.  Board Certified in Internal Medicine, Pulmonary Medicine, [place], and Sleep Medicine.  [facility] Office Number: [phone]  03/26/2018

## 2018-03-26 NOTE — Telephone Encounter (Signed)
Called and left message for [facility] [place] [facility] that we will not schedule patient's bronchoscopy until after he has been seen. He has an apt for 03/27/18 with Dr. Juanell Fairly. She is to call if she has any further questions.

## 2018-03-26 NOTE — Progress Notes (Signed)
* [facility]     Assessment and Plan:  48 year old HIV-positive male with history of treated TB remotely.  Now presents with acute hemoptysis, right upper lobe cavitary lesion.  Initial AFB negative x3. - Case is been discussed with infectious disease, and [place]. We will plan for bronchoscopy with fluoroscopic guidance for brushings, lavage. - Patient will follow-up with infectious disease for results.  Date: 03/26/2018  MRN# [id number] Joshua Dougherty [birth year]   Joshua Dougherty is a 48 y.o. old male seen in follow up for chief complaint of  Chief Complaint  Patient presents with  . Hospitalization Follow-up    doing well since discharge. occ prod cough with clear mucus.      HPI:  The patient is a 48 year old HIV-positive male, with a history of remote TB.  The patient presented to the hospital with acute hemoptysis which resolved.  However CT showed large right upper lobe cavitary lesion.  Sputum AFB negative x3, nucleic acid negative, cultures pending.  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 He has has not had issues with breathing or hemoptysis. He continues to have an occasional cough.  He is is smoking 5 cigs per day, he has not quit in the past. He would like to quit he has not quit in the past.  He does not have insurance right now.  He is upset about all the testing that he is going through and he is not able to work and having difficulty paying his bills.   Medication:    Current Outpatient Medications:  .  amoxicillin-clavulanate (AUGMENTIN) 875-125 MG tablet, Take 1 tablet by mouth 2 (two) times daily for 12 days., Disp: 24 tablet, Rfl: 0   Allergies:  Patient has no known allergies.   Review of Systems:  Constitutional: Feels well. Cardiovascular: Denies chest pain, exertional chest pain.  Pulmonary: Denies hemoptysis, pleuritic chest pain.   The remainder of systems were reviewed  and were found to be negative other than what is documented in the HPI.    Physical Examination:   VS: BP 122/64 (BP Location: Left Arm, Cuff Size: Normal)   Pulse 85   Ht 6\' 4"  (1.93 m)   Wt 147 lb 3.2 oz (66.8 kg)   SpO2 98%   BMI 17.92 kg/m   General Appearance: No distress  Neuro:without focal findings, mental status, speech normal, alert and oriented HEENT: PERRLA, EOM intact Pulmonary: No wheezing, No rales  CardiovascularNormal S1,S2.  No m/r/g.  Abdomen: Benign, Soft, non-tender, No masses Renal:  No costovertebral tenderness  GU:  No performed at this time. Endoc: No evident thyromegaly, no signs of acromegaly or Cushing features Skin:   warm, no rashes, no ecchymosis  Extremities: normal, no cyanosis, clubbing.     LABORATORY PANEL:   CBC No results for input(s): WBC, HGB, HCT, PLT in the last 168 hours. ------------------------------------------------------------------------------------------------------------------  Chemistries  Recent Labs  Lab 03/20/18 0310 03/21/18 0626  NA 131* 132*  K 3.6 4.2  CL 98 98  CO2 26 25  GLUCOSE 97 95  BUN 12 12  CREATININE 0.70 0.65  CALCIUM 8.2* 8.4*  MG 1.9  --    ------------------------------------------------------------------------------------------------------------------  Cardiac Enzymes No results for input(s): TROPONINI in the last 168 hours. ------------------------------------------------------------  RADIOLOGY:   No results found for this or any previous visit. Results for orders placed during the hospital encounter of 03/17/18  DG Chest 2 View   Narrative CLINICAL  DATA:  Shortness of breath, bloody sputum  EXAM: CHEST - 2 VIEW  COMPARISON:  01/31/2017  FINDINGS: Airspace consolidation noted in the right upper lobe with cavitation. This is new since prior study concerning for cavitary pneumonia. Left lung clear. Previously seen opacity at the right lung base has resolved. Heart is normal  size. No effusions or acute bony abnormality.  IMPRESSION: New cavitary airspace disease in the right upper lobe concerning for cavitary pneumonia.   Electronically Signed   By: Rolm Baptise M.D.   On: 03/17/2018 18:53    ------------------------------------------------------------------------------------------------------------------  Thank  you for allowing [facility] to assist in the care of your patient. Our recommendations are noted above.  Please contact us if we can be of further service.   [name], M.D., F.C.C.P.  Board Certified in Internal Medicine, Pulmonary Medicine, [place], and Sleep Medicine.  [facility] Office Number: [phone]  03/26/2018

## 2018-03-27 ENCOUNTER — Telehealth: Payer: Self-pay | Admitting: Internal Medicine

## 2018-03-27 ENCOUNTER — Other Ambulatory Visit: Payer: Self-pay

## 2018-03-27 ENCOUNTER — Encounter: Payer: Self-pay | Admitting: Internal Medicine

## 2018-03-27 ENCOUNTER — Ambulatory Visit (INDEPENDENT_AMBULATORY_CARE_PROVIDER_SITE_OTHER): Payer: Self-pay | Admitting: Internal Medicine

## 2018-03-27 ENCOUNTER — Other Ambulatory Visit: Payer: Self-pay | Admitting: Internal Medicine

## 2018-03-27 VITALS — BP 122/64 | HR 85 | Ht 76.0 in | Wt 147.2 lb

## 2018-03-27 DIAGNOSIS — A159 Respiratory tuberculosis unspecified: Secondary | ICD-10-CM

## 2018-03-27 DIAGNOSIS — J984 Other disorders of lung: Secondary | ICD-10-CM

## 2018-03-27 NOTE — Telephone Encounter (Addendum)
Pt is aware that bronch is scheduled for 03/28/18 at 9:00a. Advised pt to arrive at 8:00a. NPO after 12:00a on 03/27/18 and driver is needed.  Pt aware to wear mask at time of arrival.  Pt voiced his understanding and had no additional questions.

## 2018-03-27 NOTE — Patient Instructions (Signed)
Will plan for bronchoscopy. Follow up with Dr. Delaine Lame (infectious disease) for results.

## 2018-03-28 ENCOUNTER — Ambulatory Visit
Admission: RE | Admit: 2018-03-28 | Discharge: 2018-03-28 | Disposition: A | Discharge status: Home / Self Care | Payer: Self-pay | Attending: Internal Medicine | Admitting: Internal Medicine

## 2018-03-28 ENCOUNTER — Other Ambulatory Visit: Payer: Self-pay

## 2018-03-28 ENCOUNTER — Encounter
Admission: RE | Disposition: A | Discharge status: Home / Self Care | Payer: Self-pay | Source: Home / Self Care | Attending: Internal Medicine

## 2018-03-28 DIAGNOSIS — B371 Pulmonary candidiasis: Secondary | ICD-10-CM | POA: Insufficient documentation

## 2018-03-28 DIAGNOSIS — Z8611 Personal history of tuberculosis: Secondary | ICD-10-CM | POA: Insufficient documentation

## 2018-03-28 DIAGNOSIS — R918 Other nonspecific abnormal finding of lung field: Secondary | ICD-10-CM | POA: Insufficient documentation

## 2018-03-28 DIAGNOSIS — Z21 Asymptomatic human immunodeficiency virus [HIV] infection status: Secondary | ICD-10-CM | POA: Insufficient documentation

## 2018-03-28 DIAGNOSIS — F17210 Nicotine dependence, cigarettes, uncomplicated: Secondary | ICD-10-CM | POA: Insufficient documentation

## 2018-03-28 DIAGNOSIS — J984 Other disorders of lung: Secondary | ICD-10-CM

## 2018-03-28 HISTORY — DX: Human immunodeficiency virus (HIV) disease: B20

## 2018-03-28 HISTORY — DX: Respiratory tuberculosis unspecified: A15.9

## 2018-03-28 HISTORY — PX: FLEXIBLE BRONCHOSCOPY: SHX5094

## 2018-03-28 SURGERY — BRONCHOSCOPY, FLEXIBLE
Anesthesia: Moderate Sedation | Laterality: Right

## 2018-03-28 MED ORDER — LIDOCAINE HCL 2 % EX GEL
1.0000 "application " | Freq: Once | CUTANEOUS | Status: DC
  Filled 2018-03-28: qty 4250

## 2018-03-28 MED ORDER — MIDAZOLAM HCL 2 MG/2ML IJ SOLN
INTRAMUSCULAR | Status: AC
  Filled 2018-03-28: qty 8

## 2018-03-28 MED ORDER — BUTAMBEN-TETRACAINE-BENZOCAINE 2-2-14 % EX AERO
1.0000 | INHALATION_SPRAY | Freq: Once | CUTANEOUS | Status: DC
  Filled 2018-03-28: qty 20

## 2018-03-28 MED ORDER — MIDAZOLAM HCL 2 MG/2ML IJ SOLN
INTRAMUSCULAR | Status: DC | PRN
  Administered 2018-03-28 (×4): 1 mg via INTRAVENOUS

## 2018-03-28 MED ORDER — FENTANYL CITRATE (PF) 100 MCG/2ML IJ SOLN
INTRAMUSCULAR | Status: DC | PRN
  Administered 2018-03-28 (×3): 25 ug via INTRAVENOUS
  Administered 2018-03-28: 50 ug via INTRAVENOUS

## 2018-03-28 MED ORDER — PHENYLEPHRINE HCL 0.25 % NA SOLN
1.0000 | Freq: Four times a day (QID) | NASAL | Status: DC | PRN
  Filled 2018-03-28: qty 15

## 2018-03-28 MED ORDER — FENTANYL CITRATE (PF) 100 MCG/2ML IJ SOLN
INTRAMUSCULAR | Status: AC
  Filled 2018-03-28: qty 4

## 2018-03-28 NOTE — Discharge Instructions (Signed)
Flexible Bronchoscopy, Care After This sheet gives you information about how to care for yourself after your procedure. Your health care provider may also give you more specific instructions. If you have problems or questions, contact your health care provider. What can I expect after the procedure? After the procedure, it is common to have the following symptoms for 24-48 hours:  A cough that is worse than it was before the procedure.  A low-grade fever.  A sore throat or hoarse voice.  Small streaks of blood in the mucus from your lungs (sputum), if tissue samples were removed (biopsy). Follow these instructions at home: Eating and drinking  Do not eat or drink anything (including water) for 2 hours after your procedure, or until your numbing medicine (local anesthetic) has worn off. Having a numb throat increases your risk of burning yourself or choking.  After your numbness is gone and your cough and gag reflexes have returned, you may start eating only soft foods and slowly drinking liquids.  The day after the procedure, return to your normal diet. Driving  Do not drive for 24 hours if you were given a medicine to help you relax (sedative).  Do not drive or use heavy machinery while taking prescription pain medicine. General instructions   Take over-the-counter and prescription medicines only as told by your health care provider.  Return to your normal activities as told by your health care provider. Ask your health care provider what activities are safe for you.  Do not use any products that contain nicotine or tobacco, such as cigarettes and e-cigarettes. If you need help quitting, ask your health care provider.  Keep all follow-up visits as told by your health care provider. This is important, especially if you had a biopsy taken. Get help right away if:  You have shortness of breath that gets worse.  You become light-headed or feel like you might faint.  You have  chest pain.  You cough up more than a small amount of blood.  The amount of blood you cough up increases. Summary  Common symptoms in the 24-48 hours following a flexible bronchoscopy include cough, low-grade fever, sore throat or hoarse voice, and blood-streaked mucus from the lungs (if you had a biopsy).  Do not eat or drink anything (including water) for 2 hours after your procedure, or until your local anesthetic has worn off. You can return to your normal diet the day after the procedure.  Get help right away if you develop worsening shortness of breath, have chest pain, become light-headed, or cough up more than a small amount of blood. This information is not intended to replace advice given to you by your health care provider. Make sure you discuss any questions you have with your health care provider. Document Released: 07/28/2004 Document Revised: 01/27/2016 Document Reviewed: 01/27/2016 Elsevier Interactive Patient Education  2019 [employer].

## 2018-03-28 NOTE — Interval H&P Note (Signed)
History and Physical Interval Note:  03/28/2018 8:23 AM  Joshua Dougherty  has presented today for surgery, with the diagnosis of RULE OUT TB  PT HAS HIV  The various methods of treatment have been discussed with the patient and family. After consideration of risks, benefits and other options for treatment, the patient has consented to  Procedure(s): FLEXIBLE BRONCHOSCOPY, RIGHT (Right) as a surgical intervention .  The patient's history has been reviewed, patient examined, no change in status, stable for surgery.  I have reviewed the patient's chart and labs.  Questions were answered to the patient's satisfaction.     Laverle Hobby

## 2018-03-28 NOTE — Op Note (Signed)
  [facility]            Bronchoscopy Note   FINDINGS/SUMMARY:   - Diffuse erythema, edema throughout both airways consistent with changes of acute inflammation.  Moderate mucosal secretions which were suctioned and removed. - Bronchoalveolar lavage performed of the right upper lobe with a focus on the posterior segment. - Brushings performed of the right upper lobe.  Indication: HIV positive with cavitary lung lesion of uncertain etiology. The patient (or their representative) was informed of the risks (including but not limited to bleeding, infection, respiratory failure, lung injury, tooth/oral injury) and benefits of the procedure and gave consent, see chart.   Pre-op diagnosis: Cavitary lung mass. Post-op diagnosis: Same. Estimated blood loss: None.  Medications for procedure: I was present for the entire duration and supervised the conscious sedation for a total time of 25 minutes.  Procedure description: After obtaining informed consent, timeout was called to confirm the patient and the procedure.  The right and left nares were checked for patency.  Right Larison appeared to be more patent, therefore this was anesthetized with topical lidocaine, the flexible fiberoptic bronchoscope was passed via the right naris to the posterior pharynx.  Normal movements of the vocal cords was appreciated.  Bronchoscope was then passed by the vocal cords to the main carina.  Lidocaine was applied to the main carina, as well as a right and left mainstem bronchi.  An anatomical tour was undertaken, all segments were visualized there were no gross abnormalities within the airways.  There is moderate mucosal secretions and amount of mucosal edema throughout both airways, there were moderate secretions which were suctioned and removed. The bronchoscope was then taken to the right upper lobe, brushings were performed in the posterior segment x2.  Bronchoalveolar lavage was then performed  of the right upper lobe in its entirety, in addition an additional aliquot of fluid was used to lavage the posterior segment of the right upper lobe.  Specimen sent for microbiology and cytology.  Specimens also sent for AFB smear and culture, request was made for nucleic acid test.   Condition post procedure: Stable.   Complications: None noted.     [name], M.D., F.C.C.P. Board Certified in Internal Medicine, Pulmonary Medicine, [place], and Sleep Medicine.  [facility] Office Number: [phone]  03/28/2018

## 2018-03-29 LAB — ACID FAST SMEAR (AFB, MYCOBACTERIA): Acid Fast Smear: NEGATIVE

## 2018-03-29 LAB — ACID FAST SMEAR (AFB)

## 2018-03-30 ENCOUNTER — Encounter: Payer: Self-pay | Admitting: Internal Medicine

## 2018-03-31 ENCOUNTER — Other Ambulatory Visit: Payer: Self-pay | Admitting: Infectious Diseases

## 2018-03-31 DIAGNOSIS — J984 Other disorders of lung: Secondary | ICD-10-CM

## 2018-03-31 LAB — CULTURE, BAL-QUANTITATIVE W GRAM STAIN: Culture: NORMAL

## 2018-03-31 LAB — CULTURE, BAL-QUANTITATIVE

## 2018-03-31 NOTE — Progress Notes (Signed)
Spoke to [place] farmer in the lab- to add MTB NAAT to the BAL fluid sent on 03/28/18

## 2018-04-01 LAB — CYTOLOGY - NON PAP

## 2018-04-04 LAB — MTB NAA WITHOUT AFB CULTURE: M Tuberculosis, Naa: NEGATIVE

## 2018-04-07 LAB — VIRUS CULTURE

## 2018-04-16 ENCOUNTER — Ambulatory Visit
Admission: RE | Admit: 2018-04-16 | Discharge: 2018-04-16 | Disposition: A | Discharge status: Home / Self Care | Payer: Self-pay | Source: Ambulatory Visit | Attending: Family Medicine | Admitting: Family Medicine

## 2018-04-16 ENCOUNTER — Other Ambulatory Visit: Payer: Self-pay | Admitting: Family Medicine

## 2018-04-16 ENCOUNTER — Other Ambulatory Visit: Payer: Self-pay

## 2018-04-16 DIAGNOSIS — R7611 Nonspecific reaction to tuberculin skin test without active tuberculosis: Secondary | ICD-10-CM

## 2018-05-01 LAB — ACID FAST CULTURE WITH REFLEXED SENSITIVITIES (MYCOBACTERIA)
Acid Fast Culture: NEGATIVE
Acid Fast Culture: NEGATIVE

## 2018-05-02 LAB — FUNGUS CULTURE RESULT

## 2018-05-02 LAB — FUNGAL ORGANISM REFLEX

## 2018-05-02 LAB — FUNGUS CULTURE WITH STAIN

## 2018-05-07 ENCOUNTER — Telehealth: Payer: Self-pay | Admitting: *Deleted

## 2018-05-07 NOTE — Telephone Encounter (Signed)
Information sent to [facility] for follow up and connection to care.

## 2018-05-11 LAB — ACID FAST CULTURE WITH REFLEXED SENSITIVITIES (MYCOBACTERIA): Acid Fast Culture: NEGATIVE

## 2018-05-18 ENCOUNTER — Other Ambulatory Visit
Admission: AD | Admit: 2018-05-18 | Discharge: 2018-05-18 | Disposition: A | Discharge status: Home / Self Care | Attending: Family Medicine | Admitting: Family Medicine

## 2018-05-18 NOTE — ED Notes (Signed)
Pt presented to ED in custody of [place] [employer], who had search warrant for blood draw. Pt skin was prepped with betadine and blood collected by this RN without incident.

## 2018-06-09 ENCOUNTER — Encounter: Payer: Self-pay | Admitting: Anesthesiology

## 2018-07-01 ENCOUNTER — Emergency Department: Payer: Medicaid Other

## 2018-07-01 ENCOUNTER — Inpatient Hospital Stay
Admission: EM | Admit: 2018-07-01 | Discharge: 2018-07-10 | DRG: 974 | Disposition: A | Discharge status: Hospice | Payer: Medicaid Other | Attending: Internal Medicine | Admitting: Internal Medicine

## 2018-07-01 ENCOUNTER — Encounter: Payer: Self-pay | Admitting: Emergency Medicine

## 2018-07-01 ENCOUNTER — Inpatient Hospital Stay: Payer: Medicaid Other

## 2018-07-01 ENCOUNTER — Other Ambulatory Visit: Payer: Self-pay

## 2018-07-01 DIAGNOSIS — Z992 Dependence on renal dialysis: Secondary | ICD-10-CM

## 2018-07-01 DIAGNOSIS — N170 Acute kidney failure with tubular necrosis: Secondary | ICD-10-CM | POA: Diagnosis present

## 2018-07-01 DIAGNOSIS — J189 Pneumonia, unspecified organism: Secondary | ICD-10-CM | POA: Diagnosis present

## 2018-07-01 DIAGNOSIS — J9601 Acute respiratory failure with hypoxia: Secondary | ICD-10-CM

## 2018-07-01 DIAGNOSIS — R7881 Bacteremia: Secondary | ICD-10-CM

## 2018-07-01 DIAGNOSIS — I311 Chronic constrictive pericarditis: Secondary | ICD-10-CM | POA: Diagnosis not present

## 2018-07-01 DIAGNOSIS — Z9911 Dependence on respirator [ventilator] status: Secondary | ICD-10-CM

## 2018-07-01 DIAGNOSIS — R34 Anuria and oliguria: Secondary | ICD-10-CM | POA: Diagnosis not present

## 2018-07-01 DIAGNOSIS — R809 Proteinuria, unspecified: Secondary | ICD-10-CM | POA: Diagnosis not present

## 2018-07-01 DIAGNOSIS — L02213 Cutaneous abscess of chest wall: Secondary | ICD-10-CM | POA: Diagnosis present

## 2018-07-01 DIAGNOSIS — J9602 Acute respiratory failure with hypercapnia: Secondary | ICD-10-CM | POA: Diagnosis not present

## 2018-07-01 DIAGNOSIS — Z8673 Personal history of transient ischemic attack (TIA), and cerebral infarction without residual deficits: Secondary | ICD-10-CM

## 2018-07-01 DIAGNOSIS — I739 Peripheral vascular disease, unspecified: Secondary | ICD-10-CM | POA: Diagnosis present

## 2018-07-01 DIAGNOSIS — B9561 Methicillin susceptible Staphylococcus aureus infection as the cause of diseases classified elsewhere: Secondary | ICD-10-CM

## 2018-07-01 DIAGNOSIS — N390 Urinary tract infection, site not specified: Secondary | ICD-10-CM | POA: Diagnosis present

## 2018-07-01 DIAGNOSIS — R570 Cardiogenic shock: Secondary | ICD-10-CM | POA: Diagnosis not present

## 2018-07-01 DIAGNOSIS — E874 Mixed disorder of acid-base balance: Secondary | ICD-10-CM | POA: Diagnosis present

## 2018-07-01 DIAGNOSIS — Z21 Asymptomatic human immunodeficiency virus [HIV] infection status: Secondary | ICD-10-CM

## 2018-07-01 DIAGNOSIS — A4151 Sepsis due to Escherichia coli [E. coli]: Principal | ICD-10-CM | POA: Diagnosis present

## 2018-07-01 DIAGNOSIS — Z66 Do not resuscitate: Secondary | ICD-10-CM | POA: Diagnosis present

## 2018-07-01 DIAGNOSIS — Z8615 Personal history of latent tuberculosis infection: Secondary | ICD-10-CM

## 2018-07-01 DIAGNOSIS — M109 Gout, unspecified: Secondary | ICD-10-CM | POA: Diagnosis present

## 2018-07-01 DIAGNOSIS — F17210 Nicotine dependence, cigarettes, uncomplicated: Secondary | ICD-10-CM | POA: Diagnosis present

## 2018-07-01 DIAGNOSIS — Z20828 Contact with and (suspected) exposure to other viral communicable diseases: Secondary | ICD-10-CM | POA: Diagnosis present

## 2018-07-01 DIAGNOSIS — N179 Acute kidney failure, unspecified: Secondary | ICD-10-CM

## 2018-07-01 DIAGNOSIS — B951 Streptococcus, group B, as the cause of diseases classified elsewhere: Secondary | ICD-10-CM

## 2018-07-01 DIAGNOSIS — J9690 Respiratory failure, unspecified, unspecified whether with hypoxia or hypercapnia: Secondary | ICD-10-CM

## 2018-07-01 DIAGNOSIS — Z515 Encounter for palliative care: Secondary | ICD-10-CM | POA: Diagnosis present

## 2018-07-01 DIAGNOSIS — J850 Gangrene and necrosis of lung: Secondary | ICD-10-CM

## 2018-07-01 DIAGNOSIS — R6521 Severe sepsis with septic shock: Secondary | ICD-10-CM | POA: Diagnosis present

## 2018-07-01 DIAGNOSIS — R918 Other nonspecific abnormal finding of lung field: Secondary | ICD-10-CM | POA: Diagnosis present

## 2018-07-01 DIAGNOSIS — G92 Toxic encephalopathy: Secondary | ICD-10-CM | POA: Diagnosis not present

## 2018-07-01 DIAGNOSIS — B20 Human immunodeficiency virus [HIV] disease: Secondary | ICD-10-CM | POA: Diagnosis present

## 2018-07-01 DIAGNOSIS — J9600 Acute respiratory failure, unspecified whether with hypoxia or hypercapnia: Secondary | ICD-10-CM

## 2018-07-01 DIAGNOSIS — N29 Other disorders of kidney and ureter in diseases classified elsewhere: Secondary | ICD-10-CM | POA: Diagnosis present

## 2018-07-01 DIAGNOSIS — R6520 Severe sepsis without septic shock: Secondary | ICD-10-CM

## 2018-07-01 DIAGNOSIS — J181 Lobar pneumonia, unspecified organism: Secondary | ICD-10-CM

## 2018-07-01 DIAGNOSIS — B9620 Unspecified Escherichia coli [E. coli] as the cause of diseases classified elsewhere: Secondary | ICD-10-CM

## 2018-07-01 DIAGNOSIS — N08 Glomerular disorders in diseases classified elsewhere: Secondary | ICD-10-CM | POA: Diagnosis present

## 2018-07-01 DIAGNOSIS — Z4659 Encounter for fitting and adjustment of other gastrointestinal appliance and device: Secondary | ICD-10-CM

## 2018-07-01 DIAGNOSIS — A419 Sepsis, unspecified organism: Secondary | ICD-10-CM

## 2018-07-01 LAB — BLOOD CULTURE ID PANEL (REFLEXED)
Acinetobacter baumannii: NOT DETECTED
Candida albicans: NOT DETECTED
Candida glabrata: NOT DETECTED
Candida krusei: NOT DETECTED
Candida parapsilosis: NOT DETECTED
Candida tropicalis: NOT DETECTED
Carbapenem resistance: NOT DETECTED
Enterobacter cloacae complex: NOT DETECTED
Enterobacteriaceae species: DETECTED — AB
Enterococcus species: NOT DETECTED
Escherichia coli: DETECTED — AB
Haemophilus influenzae: NOT DETECTED
Klebsiella oxytoca: NOT DETECTED
Klebsiella pneumoniae: NOT DETECTED
Listeria monocytogenes: NOT DETECTED
Methicillin resistance: NOT DETECTED
Neisseria meningitidis: NOT DETECTED
Proteus species: NOT DETECTED
Pseudomonas aeruginosa: NOT DETECTED
Serratia marcescens: NOT DETECTED
Staphylococcus aureus (BCID): DETECTED — AB
Staphylococcus species: DETECTED — AB
Streptococcus agalactiae: DETECTED — AB
Streptococcus pneumoniae: NOT DETECTED
Streptococcus pyogenes: NOT DETECTED
Streptococcus species: DETECTED — AB

## 2018-07-01 LAB — BLOOD GAS, VENOUS
Acid-base deficit: 11.9 mmol/L — ABNORMAL HIGH (ref 0.0–2.0)
Bicarbonate: 14.7 mmol/L — ABNORMAL LOW (ref 20.0–28.0)
Delivery systems: POSITIVE
FIO2: 0.7
O2 Saturation: 98.1 %
Patient temperature: 37
RATE: 8 resp/min
pCO2, Ven: 35 mmHg — ABNORMAL LOW (ref 44.0–60.0)
pH, Ven: 7.23 — ABNORMAL LOW (ref 7.250–7.430)
pO2, Ven: 123 mmHg — ABNORMAL HIGH (ref 32.0–45.0)

## 2018-07-01 LAB — CBC WITH DIFFERENTIAL/PLATELET
Abs Immature Granulocytes: 0.08 10*3/uL — ABNORMAL HIGH (ref 0.00–0.07)
Abs Immature Granulocytes: 0.03 10*3/uL (ref 0.00–0.07)
Basophils Absolute: 0.1 10*3/uL (ref 0.0–0.1)
Basophils Absolute: 0 10*3/uL (ref 0.0–0.1)
Basophils Relative: 1 %
Basophils Relative: 1 %
Eosinophils Absolute: 0 10*3/uL (ref 0.0–0.5)
Eosinophils Absolute: 0 10*3/uL (ref 0.0–0.5)
Eosinophils Relative: 1 %
Eosinophils Relative: 0 %
HCT: 45 % (ref 39.0–52.0)
HCT: 39.7 % (ref 39.0–52.0)
Hemoglobin: 15.6 g/dL (ref 13.0–17.0)
Hemoglobin: 13.2 g/dL (ref 13.0–17.0)
Immature Granulocytes: 1 %
Immature Granulocytes: 1 %
Lymphocytes Relative: 10 %
Lymphocytes Relative: 9 %
Lymphs Abs: 0.6 10*3/uL — ABNORMAL LOW (ref 0.7–4.0)
Lymphs Abs: 0.4 10*3/uL — ABNORMAL LOW (ref 0.7–4.0)
MCH: 33.5 pg (ref 26.0–34.0)
MCH: 33.3 pg (ref 26.0–34.0)
MCHC: 34.7 g/dL (ref 30.0–36.0)
MCHC: 33.2 g/dL (ref 30.0–36.0)
MCV: 96.6 fL (ref 80.0–100.0)
MCV: 100.3 fL — ABNORMAL HIGH (ref 80.0–100.0)
Monocytes Absolute: 0.3 10*3/uL (ref 0.1–1.0)
Monocytes Absolute: 0.3 10*3/uL (ref 0.1–1.0)
Monocytes Relative: 5 %
Monocytes Relative: 6 %
Neutro Abs: 5.2 10*3/uL (ref 1.7–7.7)
Neutro Abs: 4.2 10*3/uL (ref 1.7–7.7)
Neutrophils Relative %: 82 %
Neutrophils Relative %: 83 %
Platelets: 270 10*3/uL (ref 150–400)
Platelets: 236 10*3/uL (ref 150–400)
RBC: 4.66 MIL/uL (ref 4.22–5.81)
RBC: 3.96 MIL/uL — ABNORMAL LOW (ref 4.22–5.81)
RDW: 14.2 % (ref 11.5–15.5)
RDW: 14.6 % (ref 11.5–15.5)
Smear Review: NORMAL
WBC: 6.4 10*3/uL (ref 4.0–10.5)
WBC: 5 10*3/uL (ref 4.0–10.5)
nRBC: 0.3 % — ABNORMAL HIGH (ref 0.0–0.2)
nRBC: 0 % (ref 0.0–0.2)

## 2018-07-01 LAB — COMPREHENSIVE METABOLIC PANEL
ALT: 18 U/L (ref 0–44)
AST: 83 U/L — ABNORMAL HIGH (ref 15–41)
Albumin: UNDETERMINED g/dL (ref 3.5–5.0)
Alkaline Phosphatase: 128 U/L — ABNORMAL HIGH (ref 38–126)
Anion gap: 17 — ABNORMAL HIGH (ref 5–15)
BUN: UNDETERMINED mg/dL (ref 6–20)
CO2: 12 mmol/L — ABNORMAL LOW (ref 22–32)
Calcium: 7.9 mg/dL — ABNORMAL LOW (ref 8.9–10.3)
Chloride: 102 mmol/L (ref 98–111)
Creatinine, Ser: UNDETERMINED mg/dL (ref 0.61–1.24)
Glucose, Bld: 88 mg/dL (ref 70–99)
Potassium: 3.8 mmol/L (ref 3.5–5.1)
Sodium: 131 mmol/L — ABNORMAL LOW (ref 135–145)
Total Bilirubin: UNDETERMINED mg/dL (ref 0.3–1.2)
Total Protein: 7.7 g/dL (ref 6.5–8.1)

## 2018-07-01 LAB — URINALYSIS, ROUTINE W REFLEX MICROSCOPIC
Bilirubin Urine: NEGATIVE
Glucose, UA: 50 mg/dL — AB
Ketones, ur: NEGATIVE mg/dL
Leukocytes,Ua: NEGATIVE
Nitrite: NEGATIVE
Protein, ur: 100 mg/dL — AB
Specific Gravity, Urine: 1.025 (ref 1.005–1.030)
WBC, UA: >50 WBC/hpf — ABNORMAL HIGH (ref 0–5)
pH: 5 (ref 5.0–8.0)

## 2018-07-01 LAB — BLOOD GAS, ARTERIAL
Acid-base deficit: 16.7 mmol/L — ABNORMAL HIGH (ref 0.0–2.0)
Acid-base deficit: 14.6 mmol/L — ABNORMAL HIGH (ref 0.0–2.0)
Bicarbonate: 15.9 mmol/L — ABNORMAL LOW (ref 20.0–28.0)
Bicarbonate: 16.3 mmol/L — ABNORMAL LOW (ref 20.0–28.0)
FIO2: 1
FIO2: 100
MECHVT: 500 mL
MECHVT: 500 mL
Mechanical Rate: 22
O2 Saturation: 77.1 %
O2 Saturation: 87.9 %
PEEP: 5 cmH2O
PEEP: 8 cmH2O
Patient temperature: 37
Patient temperature: 37
RATE: 22 resp/min
RATE: 28 resp/min
pCO2 arterial: 69 mmHg (ref 32.0–48.0)
pCO2 arterial: 59 mmHg — ABNORMAL HIGH (ref 32.0–48.0)
pH, Arterial: 6.97 — CL (ref 7.350–7.450)
pH, Arterial: 7.05 — CL (ref 7.350–7.450)
pO2, Arterial: 66 mmHg — ABNORMAL LOW (ref 83.0–108.0)
pO2, Arterial: 78 mmHg — ABNORMAL LOW (ref 83.0–108.0)

## 2018-07-01 LAB — BASIC METABOLIC PANEL
Anion gap: 14 (ref 5–15)
Anion gap: 14 (ref 5–15)
BUN: 94 mg/dL — ABNORMAL HIGH (ref 6–20)
BUN: 97 mg/dL — ABNORMAL HIGH (ref 6–20)
CO2: 16 mmol/L — ABNORMAL LOW (ref 22–32)
CO2: 15 mmol/L — ABNORMAL LOW (ref 22–32)
Calcium: 7.7 mg/dL — ABNORMAL LOW (ref 8.9–10.3)
Calcium: 7.4 mg/dL — ABNORMAL LOW (ref 8.9–10.3)
Chloride: 103 mmol/L (ref 98–111)
Chloride: 105 mmol/L (ref 98–111)
Creatinine, Ser: 5.56 mg/dL — ABNORMAL HIGH (ref 0.61–1.24)
Creatinine, Ser: 5.98 mg/dL — ABNORMAL HIGH (ref 0.61–1.24)
GFR calc Af Amer: 13 mL/min — ABNORMAL LOW (ref >60)
GFR calc Af Amer: 12 mL/min — ABNORMAL LOW (ref >60)
GFR calc non Af Amer: 11 mL/min — ABNORMAL LOW (ref >60)
GFR calc non Af Amer: 10 mL/min — ABNORMAL LOW (ref >60)
Glucose, Bld: 103 mg/dL — ABNORMAL HIGH (ref 70–99)
Glucose, Bld: 101 mg/dL — ABNORMAL HIGH (ref 70–99)
Potassium: 3.4 mmol/L — ABNORMAL LOW (ref 3.5–5.1)
Potassium: 4.3 mmol/L (ref 3.5–5.1)
Sodium: 133 mmol/L — ABNORMAL LOW (ref 135–145)
Sodium: 134 mmol/L — ABNORMAL LOW (ref 135–145)

## 2018-07-01 LAB — CORTISOL: Cortisol, Plasma: >100 ug/dL

## 2018-07-01 LAB — URINE DRUG SCREEN, QUALITATIVE (ARMC ONLY)
Amphetamines, Ur Screen: NOT DETECTED
Barbiturates, Ur Screen: NOT DETECTED
Benzodiazepine, Ur Scrn: NOT DETECTED
Cannabinoid 50 Ng, Ur ~~LOC~~: NOT DETECTED
Cocaine Metabolite,Ur ~~LOC~~: NOT DETECTED
MDMA (Ecstasy)Ur Screen: NOT DETECTED
Methadone Scn, Ur: NOT DETECTED
Opiate, Ur Screen: NOT DETECTED
Phencyclidine (PCP) Ur S: NOT DETECTED
Tricyclic, Ur Screen: NOT DETECTED

## 2018-07-01 LAB — STREP PNEUMONIAE URINARY ANTIGEN: Strep Pneumo Urinary Antigen: NEGATIVE

## 2018-07-01 LAB — CBC
HCT: 38 % — ABNORMAL LOW (ref 39.0–52.0)
Hemoglobin: 13 g/dL (ref 13.0–17.0)
MCH: 33.2 pg (ref 26.0–34.0)
MCHC: 34.2 g/dL (ref 30.0–36.0)
MCV: 96.9 fL (ref 80.0–100.0)
Platelets: 256 10*3/uL (ref 150–400)
RBC: 3.92 MIL/uL — ABNORMAL LOW (ref 4.22–5.81)
RDW: 14.3 % (ref 11.5–15.5)
WBC: 2.4 10*3/uL — ABNORMAL LOW (ref 4.0–10.5)
nRBC: 0 % (ref 0.0–0.2)

## 2018-07-01 LAB — GLUCOSE, CAPILLARY
Glucose-Capillary: 102 mg/dL — ABNORMAL HIGH (ref 70–99)
Glucose-Capillary: 103 mg/dL — ABNORMAL HIGH (ref 70–99)
Glucose-Capillary: 114 mg/dL — ABNORMAL HIGH (ref 70–99)
Glucose-Capillary: 133 mg/dL — ABNORMAL HIGH (ref 70–99)
Glucose-Capillary: 61 mg/dL — ABNORMAL LOW (ref 70–99)
Glucose-Capillary: 75 mg/dL (ref 70–99)

## 2018-07-01 LAB — TROPONIN I
Troponin I: UNDETERMINED ng/mL (ref <0.03)
Troponin I: 0.05 ng/mL (ref <0.03)

## 2018-07-01 LAB — LACTIC ACID, PLASMA
Lactic Acid, Venous: 3.6 mmol/L (ref 0.5–1.9)
Lactic Acid, Venous: 3 mmol/L (ref 0.5–1.9)

## 2018-07-01 LAB — CK: Total CK: 220 U/L (ref 49–397)

## 2018-07-01 LAB — MRSA PCR SCREENING: MRSA by PCR: NEGATIVE

## 2018-07-01 LAB — SARS CORONAVIRUS 2 BY RT PCR (HOSPITAL ORDER, PERFORMED IN ~~LOC~~ HOSPITAL LAB): SARS Coronavirus 2: NEGATIVE

## 2018-07-01 MED ORDER — CLINDAMYCIN PHOSPHATE 600 MG/50ML IV SOLN
600.0000 mg | Freq: Once | INTRAVENOUS | Status: AC
  Administered 2018-07-01: 600 mg via INTRAVENOUS
  Filled 2018-07-01: qty 50

## 2018-07-01 MED ORDER — LORAZEPAM 2 MG/ML IJ SOLN
2.0000 mg | Freq: Once | INTRAMUSCULAR | Status: AC
  Administered 2018-07-01: 2 mg via INTRAVENOUS

## 2018-07-01 MED ORDER — SODIUM CHLORIDE 0.9 % IV BOLUS
1000.0000 mL | Freq: Once | INTRAVENOUS | Status: AC
  Administered 2018-07-01: 1000 mL via INTRAVENOUS

## 2018-07-01 MED ORDER — ORAL CARE MOUTH RINSE
15.0000 mL | OROMUCOSAL | Status: DC
  Administered 2018-07-01 – 2018-07-08 (×72): 15 mL via OROMUCOSAL

## 2018-07-01 MED ORDER — SODIUM CHLORIDE 0.9 % IV SOLN: INTRAVENOUS | Status: DC

## 2018-07-01 MED ORDER — VASOPRESSIN 20 UNIT/ML IV SOLN
0.0300 [IU]/min | INTRAVENOUS | Status: DC
  Administered 2018-07-01 – 2018-07-04 (×4): 0.03 [IU]/min via INTRAVENOUS
  Filled 2018-07-01 (×4): qty 2

## 2018-07-01 MED ORDER — ACETAMINOPHEN 650 MG RE SUPP: 650.0000 mg | Freq: Four times a day (QID) | RECTAL | Status: DC | PRN

## 2018-07-01 MED ORDER — SODIUM CHLORIDE 0.9 % IV SOLN
500.0000 mg | INTRAVENOUS | Status: DC
  Administered 2018-07-01 – 2018-07-02 (×2): 500 mg via INTRAVENOUS
  Filled 2018-07-01 (×2): qty 500

## 2018-07-01 MED ORDER — SODIUM CHLORIDE 0.9 % IV SOLN
1.0000 g | Freq: Once | INTRAVENOUS | Status: AC
  Administered 2018-07-01: 1 g via INTRAVENOUS
  Filled 2018-07-01: qty 1

## 2018-07-01 MED ORDER — STERILE WATER FOR INJECTION IJ SOLN
INTRAMUSCULAR | Status: AC
  Administered 2018-07-01: 10 mL
  Filled 2018-07-01: qty 10

## 2018-07-01 MED ORDER — IPRATROPIUM-ALBUTEROL 0.5-2.5 (3) MG/3ML IN SOLN
3.0000 mL | RESPIRATORY_TRACT | Status: DC
  Administered 2018-07-01 – 2018-07-08 (×41): 3 mL via RESPIRATORY_TRACT
  Filled 2018-07-01 (×42): qty 3

## 2018-07-01 MED ORDER — TRAZODONE HCL 50 MG PO TABS: 25.0000 mg | ORAL_TABLET | Freq: Every evening | ORAL | Status: DC | PRN

## 2018-07-01 MED ORDER — DEXMEDETOMIDINE HCL IN NACL 400 MCG/100ML IV SOLN
0.4000 ug/kg/h | INTRAVENOUS | Status: DC
  Administered 2018-07-01: 0.5 ug/kg/h via INTRAVENOUS
  Filled 2018-07-01: qty 100

## 2018-07-01 MED ORDER — HYDROCORTISONE NA SUCCINATE PF 100 MG IJ SOLR
100.0000 mg | Freq: Three times a day (TID) | INTRAMUSCULAR | Status: DC
  Administered 2018-07-01 – 2018-07-03 (×6): 100 mg via INTRAVENOUS
  Filled 2018-07-01 (×6): qty 2

## 2018-07-01 MED ORDER — SODIUM BICARBONATE 8.4 % IV SOLN
INTRAVENOUS | Status: DC
  Administered 2018-07-01 – 2018-07-03 (×7): via INTRAVENOUS
  Filled 2018-07-01 (×9): qty 150

## 2018-07-01 MED ORDER — SODIUM CHLORIDE 0.9 % IV SOLN
2.0000 g | INTRAVENOUS | Status: DC
  Filled 2018-07-01: qty 2

## 2018-07-01 MED ORDER — CHLORHEXIDINE GLUCONATE 0.12% ORAL RINSE (MEDLINE KIT)
15.0000 mL | Freq: Two times a day (BID) | OROMUCOSAL | Status: DC
  Administered 2018-07-01 – 2018-07-08 (×15): 15 mL via OROMUCOSAL

## 2018-07-01 MED ORDER — DEXTROSE 50 % IV SOLN
INTRAVENOUS | Status: AC
  Administered 2018-07-01: 25 mL via INTRAVENOUS
  Filled 2018-07-01: qty 50

## 2018-07-01 MED ORDER — DEXTROSE 50 % IV SOLN
25.0000 mL | Freq: Once | INTRAVENOUS | Status: AC
  Administered 2018-07-01: 25 mL via INTRAVENOUS

## 2018-07-01 MED ORDER — SUCCINYLCHOLINE CHLORIDE 20 MG/ML IJ SOLN
100.0000 mg | Freq: Once | INTRAMUSCULAR | Status: AC
  Administered 2018-07-01: 100 mg via INTRAVENOUS

## 2018-07-01 MED ORDER — ATROPINE SULFATE 1 MG/10ML IJ SOSY
1.0000 mg | PREFILLED_SYRINGE | Freq: Once | INTRAMUSCULAR | Status: AC
  Administered 2018-07-01: 1 mg via INTRAVENOUS

## 2018-07-01 MED ORDER — PANTOPRAZOLE SODIUM 40 MG IV SOLR
40.0000 mg | INTRAVENOUS | Status: DC
  Administered 2018-07-01 – 2018-07-06 (×6): 40 mg via INTRAVENOUS
  Filled 2018-07-01 (×6): qty 40

## 2018-07-01 MED ORDER — ONDANSETRON HCL 4 MG PO TABS: 4.0000 mg | ORAL_TABLET | Freq: Four times a day (QID) | ORAL | Status: DC | PRN

## 2018-07-01 MED ORDER — SODIUM CHLORIDE 0.9 % IV SOLN
Freq: Once | INTRAVENOUS | Status: AC
  Administered 2018-07-01: 05:00:00 via INTRAVENOUS

## 2018-07-01 MED ORDER — SODIUM CHLORIDE 0.9 % IV SOLN: 1.0000 g | Freq: Three times a day (TID) | INTRAVENOUS | Status: DC

## 2018-07-01 MED ORDER — ACETAMINOPHEN 325 MG PO TABS
650.0000 mg | ORAL_TABLET | Freq: Four times a day (QID) | ORAL | Status: DC | PRN
  Administered 2018-07-01: 650 mg via ORAL
  Filled 2018-07-01: qty 2

## 2018-07-01 MED ORDER — FENTANYL 2500MCG IN NS 250ML (10MCG/ML) PREMIX INFUSION
0.0000 ug/h | INTRAVENOUS | Status: DC
  Administered 2018-07-01: 100 ug/h via INTRAVENOUS
  Administered 2018-07-04: 25 ug/h via INTRAVENOUS
  Administered 2018-07-05: 250 ug/h via INTRAVENOUS
  Administered 2018-07-06: 200 ug/h via INTRAVENOUS
  Administered 2018-07-07: 18:00:00 400 ug/h via INTRAVENOUS
  Administered 2018-07-08: 250 ug/h via INTRAVENOUS
  Filled 2018-07-01 (×8): qty 250

## 2018-07-01 MED ORDER — ETOMIDATE 2 MG/ML IV SOLN
20.0000 mg | Freq: Once | INTRAVENOUS | Status: AC
  Administered 2018-07-01: 20 mg via INTRAVENOUS

## 2018-07-01 MED ORDER — VANCOMYCIN HCL 500 MG IV SOLR
500.0000 mg | Freq: Once | INTRAVENOUS | Status: AC
  Administered 2018-07-01: 500 mg via INTRAVENOUS
  Filled 2018-07-01: qty 500

## 2018-07-01 MED ORDER — HEPARIN SODIUM (PORCINE) 5000 UNIT/ML IJ SOLN
5000.0000 [IU] | Freq: Two times a day (BID) | INTRAMUSCULAR | Status: DC
  Administered 2018-07-01 – 2018-07-07 (×13): 5000 [IU] via SUBCUTANEOUS
  Filled 2018-07-01 (×13): qty 1

## 2018-07-01 MED ORDER — ONDANSETRON HCL 4 MG/2ML IJ SOLN: 4.0000 mg | Freq: Four times a day (QID) | INTRAMUSCULAR | Status: DC | PRN

## 2018-07-01 MED ORDER — VECURONIUM BROMIDE 10 MG IV SOLR
10.0000 mg | Freq: Once | INTRAVENOUS | Status: AC
  Administered 2018-07-01: 10 mg via INTRAVENOUS
  Filled 2018-07-01: qty 10

## 2018-07-01 MED ORDER — ENOXAPARIN SODIUM 40 MG/0.4ML ~~LOC~~ SOLN
30.0000 mg | SUBCUTANEOUS | Status: DC
  Administered 2018-07-01: 30 mg via SUBCUTANEOUS
  Filled 2018-07-01: qty 0.4

## 2018-07-01 MED ORDER — PROPOFOL 1000 MG/100ML IV EMUL
INTRAVENOUS | Status: AC
  Administered 2018-07-02: 04:00:00 25 ug/kg/min via INTRAVENOUS
  Filled 2018-07-01: qty 100

## 2018-07-01 MED ORDER — LORAZEPAM 2 MG/ML IJ SOLN
INTRAMUSCULAR | Status: AC
  Filled 2018-07-01: qty 1

## 2018-07-01 MED ORDER — NOREPINEPHRINE 16 MG/250ML-% IV SOLN
0.0000 ug/min | INTRAVENOUS | Status: DC
  Administered 2018-07-01: 34 ug/min via INTRAVENOUS
  Administered 2018-07-01 – 2018-07-02 (×3): 40 ug/min via INTRAVENOUS
  Filled 2018-07-01 (×4): qty 250

## 2018-07-01 MED ORDER — PROPOFOL 1000 MG/100ML IV EMUL
5.0000 ug/kg/min | INTRAVENOUS | Status: DC
  Administered 2018-07-01 – 2018-07-02 (×3): 20 ug/kg/min via INTRAVENOUS
  Administered 2018-07-02: 25 ug/kg/min via INTRAVENOUS
  Administered 2018-07-03: 20 ug/kg/min via INTRAVENOUS
  Administered 2018-07-03 – 2018-07-05 (×2): 5 ug/kg/min via INTRAVENOUS
  Administered 2018-07-06 (×2): 25 ug/kg/min via INTRAVENOUS
  Administered 2018-07-07: 15 ug/kg/min via INTRAVENOUS
  Administered 2018-07-07: 10 ug/kg/min via INTRAVENOUS
  Filled 2018-07-01 (×11): qty 100

## 2018-07-01 MED ORDER — NOREPINEPHRINE 4 MG/250ML-% IV SOLN
0.0000 ug/min | INTRAVENOUS | Status: DC
  Administered 2018-07-01: 8 ug/min via INTRAVENOUS
  Administered 2018-07-01: 40 ug/min via INTRAVENOUS
  Filled 2018-07-01: qty 250

## 2018-07-01 MED ORDER — EPINEPHRINE 1 MG/10ML IJ SOSY
1.0000 | PREFILLED_SYRINGE | Freq: Once | INTRAMUSCULAR | Status: AC
  Administered 2018-07-01: 1 mg via INTRAVENOUS

## 2018-07-01 MED ORDER — IPRATROPIUM-ALBUTEROL 0.5-2.5 (3) MG/3ML IN SOLN
3.0000 mL | Freq: Four times a day (QID) | RESPIRATORY_TRACT | Status: DC
  Administered 2018-07-01: 3 mL via RESPIRATORY_TRACT
  Filled 2018-07-01: qty 3

## 2018-07-01 MED ORDER — SODIUM CHLORIDE 0.9 % IV SOLN
500.0000 mg | Freq: Two times a day (BID) | INTRAVENOUS | Status: DC
  Administered 2018-07-01 – 2018-07-02 (×3): 500 mg via INTRAVENOUS
  Filled 2018-07-01 (×2): qty 0.5
  Filled 2018-07-01 (×2): qty 500

## 2018-07-01 MED ORDER — GUAIFENESIN ER 600 MG PO TB12
600.0000 mg | ORAL_TABLET | Freq: Two times a day (BID) | ORAL | Status: DC
  Filled 2018-07-01 (×2): qty 1

## 2018-07-01 MED ORDER — VANCOMYCIN HCL IN DEXTROSE 1-5 GM/200ML-% IV SOLN
1000.0000 mg | Freq: Once | INTRAVENOUS | Status: AC
  Administered 2018-07-01: 1000 mg via INTRAVENOUS
  Filled 2018-07-01: qty 200

## 2018-07-01 MED FILL — Medication: Qty: 1 | Status: AC

## 2018-07-01 NOTE — ED Notes (Addendum)
Spoke to ICU charge Lovena Le and she advised that if pt could be started on a 2nd pressor that they would take report now on BP in 70's rather than wait for it to be in the 80's Messaged admitting provider for pressor

## 2018-07-01 NOTE — ED Notes (Signed)
Lab coming to draw second set of cultures

## 2018-07-01 NOTE — Progress Notes (Signed)
PHARMACY - PHYSICIAN COMMUNICATION CRITICAL VALUE ALERT - BLOOD CULTURE IDENTIFICATION (BCID)  Results for orders placed or performed during the hospital encounter of 07/01/18  Blood Culture ID Panel (Reflexed) (Collected: 07/01/2018  3:09 AM)  Result Value Ref Range   Enterococcus species NOT DETECTED NOT DETECTED   Listeria monocytogenes NOT DETECTED NOT DETECTED   Staphylococcus species DETECTED (A) NOT DETECTED   Staphylococcus aureus (BCID) DETECTED (A) NOT DETECTED   Methicillin resistance NOT DETECTED NOT DETECTED   Streptococcus species DETECTED (A) NOT DETECTED   Streptococcus agalactiae DETECTED (A) NOT DETECTED   Streptococcus pneumoniae NOT DETECTED NOT DETECTED   Streptococcus pyogenes NOT DETECTED NOT DETECTED   Acinetobacter baumannii NOT DETECTED NOT DETECTED   Enterobacteriaceae species DETECTED (A) NOT DETECTED   Enterobacter cloacae complex NOT DETECTED NOT DETECTED   Escherichia coli DETECTED (A) NOT DETECTED   Klebsiella oxytoca NOT DETECTED NOT DETECTED   Klebsiella pneumoniae NOT DETECTED NOT DETECTED   Proteus species NOT DETECTED NOT DETECTED   Serratia marcescens NOT DETECTED NOT DETECTED   Carbapenem resistance NOT DETECTED NOT DETECTED   Haemophilus influenzae NOT DETECTED NOT DETECTED   Neisseria meningitidis NOT DETECTED NOT DETECTED   Pseudomonas aeruginosa NOT DETECTED NOT DETECTED   Candida albicans NOT DETECTED NOT DETECTED   Candida glabrata NOT DETECTED NOT DETECTED   Candida krusei NOT DETECTED NOT DETECTED   Candida parapsilosis NOT DETECTED NOT DETECTED   Candida tropicalis NOT DETECTED NOT DETECTED    Name of physician (or Provider) Contacted: Vernard Gambles, MD  Changes to prescribed antibiotics required: changing cefepime to meropenem  Dallie Piles, PharmD 07/01/2018  2:23 PM

## 2018-07-01 NOTE — Consult Note (Signed)
Pharmacy Antibiotic Note  Joshua Dougherty is a 48 y.o. male admitted on 07/01/2018 with respiratory distress and pneumonia.  Pharmacy has been consulted for vancomycin dosing. Scr this morning 5.56. Last Scr on 2/27 was 0.70.   Patient had PMH of HIV and TB  Plan: Vancomycin 1g IV x 1 dose given in ED. Will give an additional vancomycin 500mg  IV x 1.   Due to patient's unstable renal function will plan to dose by levels. Will order vancomycin random level with AM labs 6/10.   Cefepime renally dose adjusted per protocol to 2g every 24 hours   Height: 6' (182.9 cm) Weight: 147 lb 11.3 oz (67 kg) IBW/kg (Calculated) : 77.6  Temp (24hrs), Avg:99.3 F (37.4 C), Min:99.3 F (37.4 C), Max:99.3 F (37.4 C)  Recent Labs  Lab 07/01/18 0119 07/01/18 0206 07/01/18 0431 07/01/18 0624  WBC 6.4  --   --  2.4*  CREATININE  --  QUANTITY NOT SUFFICIENT, UNABLE TO PERFORM TEST 5.56*  --   LATICACIDVEN  --  3.6*  --  3.0*    Estimated Creatinine Clearance: 15.4 mL/min (A) (by C-G formula based on SCr of 5.56 mg/dL (H)).    No Known Allergies  Antimicrobials this admission: 6/9 cefepime >>  6/9 vancomycin >>  6/9 azithromycin>>    Microbiology results: 6/9 BCx: pending 6/9  Sputum: pending 6/9 MRSA PCR: pending  Thank you for allowing pharmacy to be a part of this patient's care.  Pernell Dupre, PharmD, BCPS Clinical Pharmacist 07/01/2018 7:33 AM

## 2018-07-01 NOTE — Patient Care Conference (Signed)
Discussed with the patients wife and mother.  They understand the gravity of the situation and the patient's poor prognosis.  We will continue to manage the patient aggressively however should the patient have cardiac arrest he will be DNR.  Continue supportive care.

## 2018-07-01 NOTE — ED Notes (Signed)
O2 on bi-pap increased to 90% d/t pt still desat to 89% and unresponsive Dr Jimmye Norman notified of pt condition and requested to eval pt

## 2018-07-01 NOTE — ED Provider Notes (Signed)
Patient arrested briefly, nonpalpable pulses.  He is already intubated with central line on pressors.  Severe sepsis and septic shock.  We gave 1 mg of epinephrine and 1 mg of atropine and started chest compressions.  He had return of circulation.  Patient will be transported to the ICU.   Earleen Newport, MD 07/01/18 [phone]

## 2018-07-01 NOTE — ED Notes (Signed)
Dr Jimmye Norman notified of critical lactic acid and troponin - no new orders at this time

## 2018-07-01 NOTE — ED Notes (Addendum)
During bedside report pt noted to be unresponsive accept for head turning with sternal rub - pt has a fixed gaze to the right with O2 sat 87% on bi-pap Respiratory called and this nurse advised to increae O2 on bi-pap to 80% and if no response to increase O2 on bi-pap to 90% and then to call if no improvement Os on bi-pap increased to 80%

## 2018-07-01 NOTE — ED Notes (Signed)
Dr Jimmye Norman verified placement of temp foley

## 2018-07-01 NOTE — Progress Notes (Signed)
Patient has been off sedation since approximately 1300- patient continues to be unresponsive- pupils pinpoint- no gag or cough.  Patient on 40 mcqs of levophed and on vasopressin.  No urine output.  Patient has temperature max of 102.8- cooling blanket applied- MD Patsey Berthold aware of this update.

## 2018-07-01 NOTE — ED Notes (Signed)
No pulses - CPR started

## 2018-07-01 NOTE — ED Provider Notes (Addendum)
  [facility] Emergency Department Provider Note       Time seen: ----------------------------------------- 7:51 AM on 07/01/2018 -----------------------------------------  I was called to the bedside for rapid deterioration in his mental status, saturations were borderline.  Decision was made to intubate and placed on pressors.  Central venous line was needed for Levophed.  Patient remains in critical condition.   Procedure Name: Intubation Date/Time: 07/01/2018 7:52 AM Performed by: Earleen Newport, MD Pre-anesthesia Checklist: Patient identified, Patient being monitored, Emergency Drugs available, Timeout performed and Suction available Oxygen Delivery Method: Non-rebreather mask Preoxygenation: Pre-oxygenation with 100% oxygen Induction Type: Rapid sequence Ventilation: Mask ventilation without difficulty Laryngoscope Size: Mac and 4 Tube size: 7.5 mm Number of attempts: 1 Airway Equipment and Method: Video-laryngoscopy Placement Confirmation: ETT inserted through vocal cords under direct vision,  CO2 detector and Breath sounds checked- equal and bilateral Secured at: 23 cm Tube secured with: ETT holder Dental Injury: Teeth and Oropharynx as per pre-operative assessment  Difficulty Due To: Difficulty was unanticipated     .Central Line Date/Time: 07/01/2018 8:28 AM Performed by: Earleen Newport, MD Authorized by: Earleen Newport, MD   Consent:    Consent obtained:  Emergent situation Pre-procedure details:    Hand hygiene: Hand hygiene performed prior to insertion     Sterile barrier technique: All elements of maximal sterile technique followed     Skin preparation:  2% chlorhexidine Procedure details:    Location:  R femoral   Patient position:  Flat   Procedural supplies:  Triple lumen   Landmarks identified: yes     Ultrasound guidance: yes     Number of attempts:  1   Successful placement: yes   Post-procedure details:   Post-procedure:  Dressing applied and line sutured   Assessment:  Blood return through all ports and free fluid flow   Patient tolerance of procedure:  Tolerated well, no immediate complications   We are currently titrating his sedation and pressors. CRITICAL CARE Performed by: Laurence Aly   Total critical care time: 30 minutes  Critical care time was exclusive of separately billable procedures and treating other patients.  Critical care was necessary to treat or prevent imminent or life-threatening deterioration.  Critical care was time spent personally by me on the following activities: development of treatment plan with patient and/or surrogate as well as nursing, discussions with consultants, evaluation of patient's response to treatment, examination of patient, obtaining history from patient or surrogate, ordering and performing treatments and interventions, ordering and review of laboratory studies, ordering and review of radiographic studies, pulse oximetry and re-evaluation of patient's condition.    Earleen Newport, MD 07/01/18 [id number]    Earleen Newport, MD 07/01/18 [phone]

## 2018-07-01 NOTE — ED Triage Notes (Signed)
Patient arrives from home, family called sheriff department because they had not heard from him in several days, fire dept found him confused and lying supine on the ground. He expressed that he was having difficulty breathing but unable to express any other concerns. Patient arrives on non-rebreather and is alert but not oriented

## 2018-07-01 NOTE — ED Notes (Signed)
Levophed is actually 16mg /225ml but no order set found for this concentration

## 2018-07-01 NOTE — ED Notes (Signed)
Lab at bedside

## 2018-07-01 NOTE — ED Notes (Signed)
Did bedside handoff with Helene Kelp, RN. This RN noticed that patient was significantly less responsive and not maintaining his oxygen sats compared with earlier. This RN and oncoming RN spoke with ER physician about these concerns. ED doc decided to intubate and start on pressors

## 2018-07-01 NOTE — ED Provider Notes (Signed)
[facility] Emergency Department Provider Note  ____________________________________________  Time seen: Approximately 1:41 AM  I have reviewed the triage vital signs and the nursing notes.   HISTORY  Chief Complaint Altered Mental Status and Respiratory Distress  Level 5 caveat:  Portions of the history and physical were unable to be obtained due to AMS   HPI Joshua Dougherty is a 48 y.o. male with history of HIV, treated tuberculosis, cavitary lung lesion who presents for evaluation of respiratory distress.  Patient is unable to provide any history.  He establish eye contact but will not answer any questions.  He arrives in severe respiratory distress.  Per EMS, patient's family called the sheriff's department because they had not heard from him in several days.  Fire department found patient confused, lying supine on the ground with difficulty breathing.  He was put on a nonrebreather and transferred to the hospital.  Past Medical History:  Diagnosis Date  . HIV (human immunodeficiency virus infection) ([place])   . Tuberculosis     Patient Active Problem List   Diagnosis Date Noted  . CAP (community acquired pneumonia) 07/01/2018  . Protein-calorie malnutrition, severe 03/19/2018  . Hemoptysis 03/18/2018    Past Surgical History:  Procedure Laterality Date  . ABSCESS DRAINAGE  2013   chest wall; patient states it was not contiguous with his lung however this was an operation under general anesthesia  . FLEXIBLE BRONCHOSCOPY Right 03/28/2018   Procedure: FLEXIBLE BRONCHOSCOPY, RIGHT;  Surgeon: Laverle Hobby, MD;  Location: [facility] ORS;  Service: Pulmonary;  Laterality: Right;    Prior to Admission medications   Not on File    Allergies Patient has no known allergies.  History reviewed. No pertinent family history.  Social History Social History   Tobacco Use  . Smoking status: Current Every Day Smoker    Packs/day: 0.25  . Smokeless  tobacco: Current User  Substance Use Topics  . Alcohol use: Yes    Alcohol/week: 2.0 - 3.0 standard drinks    Types: 2 - 3 Standard drinks or equivalent per week    Frequency: Never  . Drug use: No    Review of Systems  Constitutional: Negative for fever. + AMS Respiratory: +  Difficulty breathing.  ____________________________________________   PHYSICAL EXAM:  VITAL SIGNS: ED Triage Vitals  Enc Vitals Group     BP 07/01/18 0110 112/82     Pulse Rate 07/01/18 0110 (!) 141     Resp 07/01/18 0110 (!) 46     Temp --      Temp src --      SpO2 07/01/18 0104 90 %     Weight 07/01/18 0121 147 lb 11.3 oz (67 kg)     Height 07/01/18 0121 6' (1.829 m)     Head Circumference --      Peak Flow --      Pain Score --      Pain Loc --      Pain Edu? --      Excl. in [place]? --     Constitutional: Awake, looking around the room, establishes eye contact but does not answers to questions, moderate respiratory distress, cachectic HEENT:      Head: Normocephalic and atraumatic.         Eyes: Conjunctivae are normal. Sclera is non-icteric.       Mouth/Throat: Mucous membranes are moist.       Neck: Supple with no signs of meningismus. Cardiovascular: Tachycardic with regular  rhythm. Respiratory: Tachypneic, hypoxic, crackles on the right  gastrointestinal: Soft, non tender, and non distended with positive bowel sounds. No rebound or guarding. Musculoskeletal: No edema, cyanosis, or erythema of extremities. Neurologic: Face is symmetric. Moving all extremities.  Skin: Skin is warm, dry and intact. No rash noted.  ____________________________________________   LABS (all labs ordered are listed, but only abnormal results are displayed)  Labs Reviewed  CBC WITH DIFFERENTIAL/PLATELET - Abnormal; Notable for the following components:      Result Value   nRBC 0.3 (*)    Lymphs Abs 0.6 (*)    Abs Immature Granulocytes 0.08 (*)    All other components within normal limits  BLOOD GAS,  VENOUS - Abnormal; Notable for the following components:   pH, Ven 7.23 (*)    pCO2, Ven 35 (*)    pO2, Ven 123.0 (*)    Bicarbonate 14.7 (*)    Acid-base deficit 11.9 (*)    All other components within normal limits  COMPREHENSIVE METABOLIC PANEL - Abnormal; Notable for the following components:   Sodium 131 (*)    CO2 12 (*)    Calcium 7.9 (*)    AST 83 (*)    Alkaline Phosphatase 128 (*)    Anion gap 17 (*)    All other components within normal limits  LACTIC ACID, PLASMA - Abnormal; Notable for the following components:   Lactic Acid, Venous 3.6 (*)    All other components within normal limits  SARS CORONAVIRUS 2 (HOSPITAL ORDER, [facility])  CULTURE, BLOOD (ROUTINE X 2)  CULTURE, BLOOD (ROUTINE X 2)  EXPECTORATED SPUTUM ASSESSMENT W REFEX TO RESP CULTURE  GRAM STAIN  MRSA PCR SCREENING  TROPONIN I  URINALYSIS, ROUTINE W REFLEX MICROSCOPIC  STREP PNEUMONIAE URINARY ANTIGEN  BASIC METABOLIC PANEL  TROPONIN I  CBC  LACTIC ACID, PLASMA  LACTIC ACID, PLASMA   ____________________________________________  EKG  ED ECG REPORT I, Rudene Re, the attending physician, personally viewed and interpreted this ECG.   Sinus tachycardia, rate of 140, normal intervals, normal axis, no ST elevations, new mild diffuse ST depressions ____________________________________________  RADIOLOGY  I have personally reviewed the images performed during this visit and I agree with the Radiologist's read.   Interpretation by Radiologist:  Dg Chest Portable 1 View  Result Date: 07/01/2018 CLINICAL DATA:  Shortness of breath EXAM: PORTABLE CHEST 1 VIEW COMPARISON:  04/16/2018 FINDINGS: There is a large airspace opacity involving much of the right lower lobe. There is likely a small parapneumonic effusion. There is a density projecting over the upper trachea which is felt to represent the patient's non-rebreather mass as opposed to a very high endotracheal tube.  Involving the left lower lobe, however this is not well evaluated secondary to multiple cardiac leads. There is no definite pneumothorax. There is a linear density coursing across the patient's right mid chest which is felt to represent artifact as opposed to a pneumothorax. There is no acute osseous abnormality. IMPRESSION: 1. Large right lower lobe airspace opacity concerning for pneumonia. 2. Probable small right-sided parapneumonic effusion. 3. Additional small airspace opacity at the left lung base. Attention on follow-up examinations is recommended. Electronically Signed   By: Constance Holster M.D.   On: 07/01/2018 01:51      ____________________________________________   PROCEDURES  Procedure(s) performed: None Procedures Critical Care performed: yes  CRITICAL CARE Performed by: Rudene Re  ?  Total critical care time: 40 min  Critical care time was exclusive  of separately billable procedures and treating other patients.  Critical care was necessary to treat or prevent imminent or life-threatening deterioration.  Critical care was time spent personally by me on the following activities: development of treatment plan with patient and/or surrogate as well as nursing, discussions with consultants, evaluation of patient's response to treatment, examination of patient, obtaining history from patient or surrogate, ordering and performing treatments and interventions, ordering and review of laboratory studies, ordering and review of radiographic studies, pulse oximetry and re-evaluation of patient's condition.  ____________________________________________   INITIAL IMPRESSION / ASSESSMENT AND PLAN / ED COURSE   48 y.o. male with history of HIV, treated tuberculosis, cavitary lung lesion who presents for evaluation of respiratory distress.  Patient arrives in moderate respiratory distress, tachypneic, hypoxic requiring nonrebreather with crackles on the right side, patient is  cachectic and unable to provide any history.  Chest x-ray concerning for right lower lobe pneumonia.  Patient placed on BiPAP and started on cefepime, Vanco and Clinda.  Labs are pending.     _________________________ 5:21 AM on 07/01/2018 -----------------------------------------  Patient with sepsis from PNA. Looks markedly improved on BiPAP. COVID negative, awaiting ICU bed availability   As part of my medical decision making, I reviewed the following data within the [facility] notes reviewed and incorporated, Labs reviewed , EKG interpreted , Old chart reviewed, Radiograph reviewed , Discussed with admitting physician , Notes from prior ED visits and [place] Controlled Substance Database    Pertinent labs & imaging results that were available during my care of the patient were reviewed by me and considered in my medical decision making (see chart for details).    ____________________________________________   FINAL CLINICAL IMPRESSION(S) / ED DIAGNOSES  Final diagnoses:  Acute respiratory failure with hypoxia ([place])  HCAP (healthcare-associated pneumonia)  Sepsis, due to unspecified organism, unspecified whether acute organ dysfunction present [facility])      NEW MEDICATIONS STARTED DURING THIS VISIT:  ED Discharge Orders    None       Note:  This document was prepared using Dragon voice recognition software and may include unintentional dictation errors.    Alfred Levins, [place], MD 07/01/18 [phone]

## 2018-07-01 NOTE — Consult Note (Signed)
NAME: Joshua Dougherty  DOB: [birth year]  MRN: [id number]  Date/Time: 07/01/2018 4:26 PM  REQUESTING PROVIDER: Patsey Berthold Subjective:  REASON FOR CONSULT: bacteremia ?chart reviewed Joshua Dougherty is a 48 y.o. male with a history of AIDS, latent TB, cavitary lesion Is admitted after being found on the floor with difficulty breathing in his apt.  IN the  ED  has been intubated, found to be in  AKI. Also has bacteremia with staph aureus, e.coli and GBS and rt lung  pneumonia. Pt was in hospital in feb and found to have a cavitating lesion lung and had 3 sputum for afb neg and also had bronch following discharge and that was negative for Afb. He has h/o positive PPD in the past and was treated,  PMH HIV GOUT Chest Wall abscess LTBI treated   Past Surgical History:  Procedure Laterality Date  . ABSCESS DRAINAGE  2013   chest wall; patient states it was not contiguous with his lung however this was an operation under general anesthesia  . FLEXIBLE BRONCHOSCOPY Right 03/28/2018   Procedure: FLEXIBLE BRONCHOSCOPY, RIGHT;  Surgeon: Laverle Hobby, MD;  Location: [facility] ORS;  Service: Pulmonary;  Laterality: Right;   hernia repair  Social History   Socioeconomic History  . Marital status: Single    Spouse name: Not on file  . Number of children: Not on file  . Years of education: Not on file  . Highest education level: Not on file  Occupational History  . Not on file  Social Needs  . Financial resource strain: Not on file  . Food insecurity:    Worry: Not on file    Inability: Not on file  . Transportation needs:    Medical: Not on file    Non-medical: Not on file  Tobacco Use  . Smoking status: Current Every Day Smoker    Packs/day: 0.25  . Smokeless tobacco: Current User  Substance and Sexual Activity  . Alcohol use: Yes    Alcohol/week: 2.0 - 3.0 standard drinks    Types: 2 - 3 Standard drinks or equivalent per week    Frequency: Never  . Drug use: No  . Sexual activity: Not on  file  Lifestyle  . Physical activity:    Days per week: Not on file    Minutes per session: Not on file  . Stress: Not on file  Relationships  . Social connections:    Talks on phone: Not on file    Gets together: Not on file    Attends religious service: Not on file    Active member of club or organization: Not on file    Attends meetings of clubs or organizations: Not on file    Relationship status: Not on file  . Intimate partner violence:    Fear of current or ex partner: Not on file    Emotionally abused: Not on file    Physically abused: Not on file    Forced sexual activity: Not on file  Other Topics Concern  . Not on file  Social History Narrative  . Not on file    History reviewed. No pertinent family history. No Known Allergies  ? Current Facility-Administered Medications  Medication Dose Route Frequency Provider Last Rate Last Dose  . acetaminophen (TYLENOL) tablet 650 mg  650 mg Oral Q6H PRN Mansy, Jan A, MD   650 mg at 07/01/18 1524   Or  . acetaminophen (TYLENOL) suppository 650 mg  650 mg Rectal Q6H PRN Mansy, Jan A,  MD      . azithromycin (ZITHROMAX) 500 mg in sodium chloride 0.9 % 250 mL IVPB  500 mg Intravenous Q24H Mansy, Jan A, MD 250 mL/hr at 07/01/18 1200 500 mg at 07/01/18 1200  . chlorhexidine gluconate (MEDLINE KIT) (PERIDEX) 0.12 % solution 15 mL  15 mL Mouth Rinse BID Tyler Pita, MD   15 mL at 07/01/18 1024  . fentaNYL 2564mg in NS 2575m(1058mml) infusion-PREMIX  0-400 mcg/hr Intravenous Continuous GonTyler PitaD 10 mL/hr at 07/01/18 1021 100 mcg/hr at 07/01/18 1021  . guaiFENesin (MUCINEX) 12 hr tablet 600 mg  600 mg Oral BID Mansy, Jan A, MD      . heparin injection 5,000 Units  5,000 Units Subcutaneous Q12H GonTyler PitaD      . hydrocortisone sodium succinate (SOLU-CORTEF) 100 MG injection 100 mg  100 mg Intravenous Q8H GonTyler PitaD   100 mg at 07/01/18 1202  . ipratropium-albuterol (DUONEB) 0.5-2.5 (3) MG/3ML  nebulizer solution 3 mL  3 mL Nebulization Q4H GonTyler PitaD   3 mL at 07/01/18 1443  . MEDLINE mouth rinse  15 mL Mouth Rinse 10 times per day GonTyler PitaD   15 mL at 07/01/18 1552  . meropenem (MERREM) 500 mg in sodium chloride 0.9 % 100 mL IVPB  500 mg Intravenous Q12H GruDallie PilesPH 200 mL/hr at 07/01/18 1516 500 mg at 07/01/18 1516  . norepinephrine (LEVOPHED) 16 mg in 250m74memix infusion  0-40 mcg/min Intravenous Titrated GonzTyler Pita 31.9 mL/hr at 07/01/18 1511 34 mcg/min at 07/01/18 1511  . ondansetron (ZOFRAN) tablet 4 mg  4 mg Oral Q6H PRN Mansy, Jan A, MD       Or  . on[facility]etron ([facility] Hospital[facility] 4 mg  4 mg Intravenous Q6H PRN Mansy, Jan A, MD      . pantoprazole (PROTONIX) injection 40 mg  40 mg Intravenous Q24H GonzVernard GamblesMD      . propofol (DIPRIVAN) 1000 MG/100ML infusion  5-80 mcg/kg/min Intravenous Continuous WillEarleen Newport 8.04 mL/hr at 07/01/18 1003 20 mcg/kg/min at 07/01/18 1003  . sodium bicarbonate 150 mEq in dextrose 5 % 1,000 mL infusion   Intravenous Continuous GonzTyler Pita 100 mL/hr at 07/01/18 1208    . traZODone (DESYREL) tablet 25 mg  25 mg Oral QHS PRN Mansy, Jan A, MD      . vasopressin (PITRESSIN) 40 Units in sodium chloride 0.9 % 250 mL (0.16 Units/mL) infusion  0.03 Units/min Intravenous Continuous GonzTyler Pita 11.25 mL/hr at 07/01/18 0947 0.03 Units/min at 07/01/18 0947     Abtx:  Anti-infectives (From admission, onward)   Start     Dose/Rate Route Frequency Ordered Stop   07/01/18 2200  ceFEPIme (MAXIPIME) 2 g in sodium chloride 0.9 % 100 mL IVPB  Status:  Discontinued     2 g 200 mL/hr over 30 Minutes Intravenous Every 24 hours 07/01/18 0727 07/01/18 1432   07/01/18 1600  meropenem (MERREM) 500 mg in sodium chloride 0.9 % 100 mL IVPB     500 mg 200 mL/hr over 30 Minutes Intravenous Every 12 hours 07/01/18 1432     07/01/18 1000  azithromycin (ZITHROMAX) 500 mg in sodium chloride  0.9 % 250 mL IVPB     500 mg 250 mL/hr over 60 Minutes Intravenous Every 24 hours 07/01/18 0425     07/01/18 1000  vancomycin (VANCOCIN) 500 mg in sodium chloride  0.9 % 100 mL IVPB     500 mg 100 mL/hr over 60 Minutes Intravenous  Once 07/01/18 0728 07/01/18 1139   07/01/18 0600  ceFEPIme (MAXIPIME) 1 g in sodium chloride 0.9 % 100 mL IVPB  Status:  Discontinued     1 g 200 mL/hr over 30 Minutes Intravenous Every 8 hours 07/01/18 0425 07/01/18 0727   07/01/18 0200  clindamycin (CLEOCIN) IVPB 600 mg     600 mg 100 mL/hr over 30 Minutes Intravenous  Once 07/01/18 0145 07/01/18 0443   07/01/18 0145  vancomycin (VANCOCIN) IVPB 1000 mg/200 mL premix     1,000 mg 200 mL/hr over 60 Minutes Intravenous  Once 07/01/18 0131 07/01/18 0443   07/01/18 0145  ceFEPIme (MAXIPIME) 1 g in sodium chloride 0.9 % 100 mL IVPB     1 g 200 mL/hr over 30 Minutes Intravenous  Once 07/01/18 0131 07/01/18 0401      REVIEW OF SYSTEMS:  NA Objective:  VITALS:  BP (!) 82/60   Pulse (!) 132   Temp (!) 101.3 F (38.5 C) (Bladder)   Resp (!) 28   Ht 6' (1.829 m)   Wt 67 kg   SpO2 92%   BMI 20.03 kg/m  PHYSICAL EXAM:  General:intubated, sedated, emaciated Head: Normocephalic, without obvious abnormality, atraumatic. Eyes: Conjunctivae clear, anicteric sclerae. Pupils are equal ENT cannot examine Neck: Supple,  Back: did not examine Lungs: decreased air entry rt side, crepts  Heart: tachycardia Abdomen: Soft, Extremities: atraumatic, no cyanosis. No edema. No clubbing Skin: No rashes or lesions. Or bruising Lymph: Cervical, supraclavicular normal. Neurologic: cannot examine Pertinent Labs Lab Results CBC    Component Value Date/Time   WBC 5.0 07/01/2018 1400   RBC 3.96 (L) 07/01/2018 1400   HGB 13.2 07/01/2018 1400   HGB 14.4 03/18/2018 1630   HCT 39.7 07/01/2018 1400   HCT 41.6 03/18/2018 1630   PLT 236 07/01/2018 1400   PLT 324 03/18/2018 1630   MCV 100.3 (H) 07/01/2018 1400   MCV 101  (H) 03/18/2018 1630   MCH 33.3 07/01/2018 1400   MCHC 33.2 07/01/2018 1400   RDW 14.6 07/01/2018 1400   RDW 12.0 03/18/2018 1630   LYMPHSABS 0.4 (L) 07/01/2018 1400   LYMPHSABS 2.2 03/18/2018 1630   MONOABS 0.3 07/01/2018 1400   EOSABS 0.0 07/01/2018 1400   EOSABS 0.0 03/18/2018 1630   BASOSABS 0.0 07/01/2018 1400   BASOSABS 0.0 03/18/2018 1630    CMP Latest Ref Rng & Units 07/01/2018 07/01/2018 07/01/2018  Glucose 70 - 99 mg/dL 101(H) 103(H) 88  BUN 6 - 20 mg/dL 97(H) 94(H) QUANTITY NOT SUFFICIENT, UNABLE TO PERFORM TEST  Creatinine 0.61 - 1.24 mg/dL 5.98(H) 5.56(H) QUANTITY NOT SUFFICIENT, UNABLE TO PERFORM TEST  Sodium 135 - 145 mmol/L 134(L) 133(L) 131(L)  Potassium 3.5 - 5.1 mmol/L 4.3 3.4(L) 3.8  Chloride 98 - 111 mmol/L 105 103 102  CO2 22 - 32 mmol/L 15(L) 16(L) 12(L)  Calcium 8.9 - 10.3 mg/dL 7.4(L) 7.7(L) 7.9(L)  Total Protein 6.5 - 8.1 g/dL - - 7.7  Total Bilirubin 0.3 - 1.2 mg/dL - - QUANTITY NOT SUFFICIENT, UNABLE TO PERFORM TEST  Alkaline Phos 38 - 126 U/L - - 128(H)  AST 15 - 41 U/L - - 83(H)  ALT 0 - 44 U/L - - 18      Microbiology: Recent Results (from the past 240 hour(s))  SARS Coronavirus 2 (CEPHEID- Performed in [place] hospital lab), Hosp Order     Status:  None   Collection Time: 07/01/18  1:19 AM  Result Value Ref Range Status   SARS Coronavirus 2 NEGATIVE NEGATIVE Final    Comment: (NOTE) If result is NEGATIVE SARS-CoV-2 target nucleic acids are NOT DETECTED. The SARS-CoV-2 RNA is generally detectable in upper and lower  respiratory specimens during the acute phase of infection. The lowest  concentration of SARS-CoV-2 viral copies this assay can detect is 250  copies / mL. A negative result does not preclude SARS-CoV-2 infection  and should not be used as the sole basis for treatment or other  patient management decisions.  A negative result may occur with  improper specimen collection / handling, submission of specimen other  than nasopharyngeal  swab, presence of viral mutation(s) within the  areas targeted by this assay, and inadequate number of viral copies  (<250 copies / mL). A negative result must be combined with clinical  observations, patient history, and epidemiological information. If result is POSITIVE SARS-CoV-2 target nucleic acids are DETECTED. The SARS-CoV-2 RNA is generally detectable in upper and lower  respiratory specimens dur ing the acute phase of infection.  Positive  results are indicative of active infection with SARS-CoV-2.  Clinical  correlation with patient history and other diagnostic information is  necessary to determine patient infection status.  Positive results do  not rule out bacterial infection or co-infection with other viruses. If result is PRESUMPTIVE POSTIVE SARS-CoV-2 nucleic acids MAY BE PRESENT.   A presumptive positive result was obtained on the submitted specimen  and confirmed on repeat testing.  While 2019 novel coronavirus  (SARS-CoV-2) nucleic acids may be present in the submitted sample  additional confirmatory testing may be necessary for epidemiological  and / or clinical management purposes  to differentiate between  SARS-CoV-2 and other Sarbecovirus currently known to infect humans.  If clinically indicated additional testing with an alternate test  methodology [phone]) is advised. The SARS-CoV-2 RNA is generally  detectable in upper and lower respiratory sp ecimens during the acute  phase of infection. The expected result is Negative. Fact Sheet for Patients:  StrictlyIdeas.no Fact Sheet for Healthcare Providers: BankingDealers.co.za This test is not yet approved or cleared by the [place] FDA and has been authorized for detection and/or diagnosis of SARS-CoV-2 by FDA under an Emergency Use Authorization (EUA).  This EUA will remain in effect (meaning this test can be used) for the duration of the COVID-19 declaration  under Section 564(b)(1) of the Act, 21 U.S.C. section 360bbb-3(b)(1), unless the authorization is terminated or revoked sooner. Performed at [facility], [address] [place], [place] [address]   Blood culture (routine x 2)     Status: None (Preliminary result)   Collection Time: 07/01/18  1:19 AM  Result Value Ref Range Status   Specimen Description   Final    BLOOD LEFT ANTECUBITAL Performed at [facility], [place] [address] [place], [place] [address]    Special Requests   Final    BOTTLES DRAWN AEROBIC AND ANAEROBIC Blood Culture results may not be optimal due to an excessive volume of blood received in culture bottles   Culture  Setup Time   Final    GRAM NEGATIVE RODS IN BOTH AEROBIC AND ANAEROBIC BOTTLES CRITICAL RESULT CALLED TO, READ BACK BY AND VERIFIED WITH: CHRISTINE KATSOUDAS AT 1941 07/01/18 SG Performed at [facility], [address], [place], [place] [address]    Culture GRAM NEGATIVE RODS  Final   Report Status PENDING  Incomplete  Blood culture (routine x 2)     Status: None (Preliminary result)   Collection Time: 07/01/18  3:09 AM  Result Value Ref Range Status   Specimen Description BLOOD LEFT ARM  Final   Special Requests   Final    BOTTLES DRAWN AEROBIC AND ANAEROBIC Blood Culture adequate volume   Culture  Setup Time   Final    Organism ID to follow GRAM POSITIVE COCCI IN BOTH AEROBIC AND ANAEROBIC BOTTLES GRAM NEGATIVE RODS IN BOTH AEROBIC AND ANAEROBIC BOTTLES CRITICAL RESULT CALLED TO, READ BACK BY AND VERIFIED WITH: CHRISTINE KATSOUDAS AT 5053 07/01/18 SG Performed at [facility], [address] [place], [place] [address]    Culture GRAM POSITIVE COCCI GRAM NEGATIVE RODS   Final   Report Status PENDING  Incomplete  Blood Culture ID Panel (Reflexed)     Status: Abnormal   Collection Time: 07/01/18  3:09 AM  Result Value Ref Range Status   Enterococcus species NOT DETECTED NOT DETECTED Final   Listeria  monocytogenes NOT DETECTED NOT DETECTED Final   Staphylococcus species DETECTED (A) NOT DETECTED Final    Comment: CRITICAL RESULT CALLED TO, READ BACK BY AND VERIFIED WITH: CHRISTINE KATSOUDAS AT 4193 07/01/18 SG    Staphylococcus aureus (BCID) DETECTED (A) NOT DETECTED Final    Comment: Methicillin (oxacillin) susceptible Staphylococcus aureus (MSSA). Preferred therapy is anti staphylococcal beta lactam antibiotic (Cefazolin or Nafcillin), unless clinically contraindicated. CRITICAL RESULT CALLED TO, READ BACK BY AND VERIFIED WITH: CHRISTINE KATSOUDAS AT 7902 07/01/18 SG    Methicillin resistance NOT DETECTED NOT DETECTED Final   Streptococcus species DETECTED (A) NOT DETECTED Final    Comment: CRITICAL RESULT CALLED TO, READ BACK BY AND VERIFIED WITH: CHRISTINE KATSOUDAS AT 1325 07/01/18 SG    Streptococcus agalactiae DETECTED (A) NOT DETECTED Final    Comment: CRITICAL RESULT CALLED TO, READ BACK BY AND VERIFIED WITH: CHRISTINE KATSOUDAS AT 4097 07/01/18 SG    Streptococcus pneumoniae NOT DETECTED NOT DETECTED Final   Streptococcus pyogenes NOT DETECTED NOT DETECTED Final   Acinetobacter baumannii NOT DETECTED NOT DETECTED Final   Enterobacteriaceae species DETECTED (A) NOT DETECTED Final    Comment: Enterobacteriaceae represent a large family of gram-negative bacteria, not a single organism. CRITICAL RESULT CALLED TO, READ BACK BY AND VERIFIED WITH: CHRISTINE KATSOUDAS AT 3532 07/01/18 SG    Enterobacter cloacae complex NOT DETECTED NOT DETECTED Final   Escherichia coli DETECTED (A) NOT DETECTED Final    Comment: CRITICAL RESULT CALLED TO, READ BACK BY AND VERIFIED WITH: CHRISTINE KATSOUDAS AT 9924 07/01/18 SG    Klebsiella oxytoca NOT DETECTED NOT DETECTED Final   Klebsiella pneumoniae NOT DETECTED NOT DETECTED Final   Proteus species NOT DETECTED NOT DETECTED Final   Serratia marcescens NOT DETECTED NOT DETECTED Final   Carbapenem resistance NOT DETECTED NOT DETECTED Final    Haemophilus influenzae NOT DETECTED NOT DETECTED Final   Neisseria meningitidis NOT DETECTED NOT DETECTED Final   Pseudomonas aeruginosa NOT DETECTED NOT DETECTED Final   Candida albicans NOT DETECTED NOT DETECTED Final   Candida glabrata NOT DETECTED NOT DETECTED Final   Candida krusei NOT DETECTED NOT DETECTED Final   Candida parapsilosis NOT DETECTED NOT DETECTED Final   Candida tropicalis NOT DETECTED NOT DETECTED Final    Comment: Performed at [facility], [address] [place], [place] [address]  MRSA PCR Screening     Status: None   Collection Time: 07/01/18  9:53 AM  Result Value Ref Range  Status   MRSA by PCR NEGATIVE NEGATIVE Final    Comment:        The GeneXpert MRSA Assay (FDA approved for NASAL specimens only), is one component of a comprehensive MRSA colonization surveillance program. It is not intended to diagnose MRSA infection nor to guide or monitor treatment for MRSA infections. Performed at [facility], [address] [place],  [address]     IMAGING RESULTS:   I have personally reviewed the films ? Impression/Recommendation ? Polymicrobial bacteremia- staph aureus, E.coli and GBS bacteremia- ??source ? Lung  Acute hypoxic respiratory failure intubated  Rt lower lobe pneumonia in a patient with cavitary lesion-  Acute hypoxic resp failure  Acute renal failure- Due to ATN from sepsis ? HIV- cd4 is 403, VL 29K in Feb 2020- not on HAART as not engaged in care- will check labs    ___________________________________________________ Discussed with nurse requesting provider

## 2018-07-01 NOTE — ED Notes (Signed)
Notified that patient had bed in ICU but would not be available until 0700

## 2018-07-01 NOTE — ED Notes (Signed)
Pt emergency contact (mother) notified/updated on pt condition at [phone]

## 2018-07-01 NOTE — Progress Notes (Signed)
Admitted this morning for acute respiratory failure, septic shock intubated, on high-dose pressors, critically ill.  Family is notified.  High risk for cardiac arrest.  Patient was coded briefly in the emergency room.

## 2018-07-01 NOTE — ED Notes (Signed)
ED TO INPATIENT HANDOFF REPORT  ED Nurse Name and Phone #: Bascom Levels Name/Age/Gender Joshua Dougherty 48 y.o. male Room/Bed: ED11A/ED11A  Code Status   Code Status: Full Code  Home/SNF/Other Home Patient oriented to: self and place Is this baseline? No   Triage Complete: Triage complete  Chief Complaint Breathing Difficulty  Triage Note Patient arrives from home, family called sheriff department because they had not heard from him in several days, fire dept found him confused and lying supine on the ground. He expressed that he was having difficulty breathing but unable to express any other concerns. Patient arrives on non-rebreather and is alert but not oriented   Allergies No Known Allergies  Level of Care/Admitting Diagnosis ED Disposition    ED Disposition Condition [facility]: [place] [100120]  Level of Care: Stepdown [14]  Covid Evaluation: Confirmed COVID Negative  Diagnosis: CAP (community acquired pneumonia) [166063]  Admitting Physician: Christel Mormon [[id number]]  Attending Physician: Christel Mormon [[id number]]  Estimated length of stay: 3 - 4 days  Certification:: I certify this patient will need inpatient services for at least 2 midnights  PT Class (Do Not Modify): Inpatient [101]  PT Acc Code (Do Not Modify): Private [1]       B Medical/Surgery History Past Medical History:  Diagnosis Date  . HIV (human immunodeficiency virus infection) ([place])   . Tuberculosis    Past Surgical History:  Procedure Laterality Date  . ABSCESS DRAINAGE  2013   chest wall; patient states it was not contiguous with his lung however this was an operation under general anesthesia  . FLEXIBLE BRONCHOSCOPY Right 03/28/2018   Procedure: FLEXIBLE BRONCHOSCOPY, RIGHT;  Surgeon: Laverle Hobby, MD;  Location: [facility] ORS;  Service: Pulmonary;  Laterality: Right;     A IV Location/Drains/Wounds Patient Lines/Drains/Airways Status   Active  Line/Drains/Airways    Name:   Placement date:   Placement time:   Site:   Days:   Peripheral IV 07/01/18 Left Antecubital   07/01/18    -    Antecubital   less than 1   Peripheral IV 07/01/18 Right Wrist   07/01/18    0140    Wrist   less than 1          Intake/Output Last 24 hours No intake or output data in the 24 hours ending 07/01/18 2202  Labs/Imaging Results for orders placed or performed during the hospital encounter of 07/01/18 (from the past 60 hour(s))  SARS Coronavirus 2 (CEPHEID- Performed in [place] hospital lab), Hosp Order     Status: None   Collection Time: 07/01/18  1:19 AM  Result Value Ref Range   SARS Coronavirus 2 NEGATIVE NEGATIVE    Comment: (NOTE) If result is NEGATIVE SARS-CoV-2 target nucleic acids are NOT DETECTED. The SARS-CoV-2 RNA is generally detectable in upper and lower  respiratory specimens during the acute phase of infection. The lowest  concentration of SARS-CoV-2 viral copies this assay can detect is 250  copies / mL. A negative result does not preclude SARS-CoV-2 infection  and should not be used as the sole basis for treatment or other  patient management decisions.  A negative result may occur with  improper specimen collection / handling, submission of specimen other  than nasopharyngeal swab, presence of viral mutation(s) within the  areas targeted by this assay, and inadequate Dougherty of viral copies  (<250 copies / mL). A negative result must be combined  with clinical  observations, patient history, and epidemiological information. If result is POSITIVE SARS-CoV-2 target nucleic acids are DETECTED. The SARS-CoV-2 RNA is generally detectable in upper and lower  respiratory specimens dur ing the acute phase of infection.  Positive  results are indicative of active infection with SARS-CoV-2.  Clinical  correlation with patient history and other diagnostic information is  necessary to determine patient infection status.  Positive  results do  not rule out bacterial infection or co-infection with other viruses. If result is PRESUMPTIVE POSTIVE SARS-CoV-2 nucleic acids MAY BE PRESENT.   A presumptive positive result was obtained on the submitted specimen  and confirmed on repeat testing.  While 2019 novel coronavirus  (SARS-CoV-2) nucleic acids may be present in the submitted sample  additional confirmatory testing may be necessary for epidemiological  and / or clinical management purposes  to differentiate between  SARS-CoV-2 and other Sarbecovirus currently known to infect humans.  If clinically indicated additional testing with an alternate test  methodology [phone]) is advised. The SARS-CoV-2 RNA is generally  detectable in upper and lower respiratory sp ecimens during the acute  phase of infection. The expected result is Negative. Fact Sheet for Patients:  StrictlyIdeas.no Fact Sheet for Healthcare Providers: BankingDealers.co.za This test is not yet approved or cleared by the [place] FDA and has been authorized for detection and/or diagnosis of SARS-CoV-2 by FDA under an Emergency Use Authorization (EUA).  This EUA will remain in effect (meaning this test can be used) for the duration of the COVID-19 declaration under Section 564(b)(1) of the Act, 21 U.S.C. section 360bbb-3(b)(1), unless the authorization is terminated or revoked sooner. Performed at [facility], [address] [place], [place] [address]   CBC with Differential/Platelet     Status: Abnormal   Collection Time: 07/01/18  1:19 AM  Result Value Ref Range   WBC 6.4 4.0 - 10.5 K/uL    Comment: WHITE COUNT CONFIRMED ON SMEAR   RBC 4.66 4.22 - 5.81 MIL/uL   Hemoglobin 15.6 13.0 - 17.0 g/dL   HCT 45.0 39.0 - 52.0 %   MCV 96.6 80.0 - 100.0 fL   MCH 33.5 26.0 - 34.0 pg   MCHC 34.7 30.0 - 36.0 g/dL   RDW 14.2 11.5 - 15.5 %   Platelets 270 150 - 400 K/uL   nRBC 0.3 (H) 0.0 - 0.2  %   Neutrophils Relative % 82 %   Neutro Abs 5.2 1.7 - 7.7 K/uL   Lymphocytes Relative 10 %   Lymphs Abs 0.6 (L) 0.7 - 4.0 K/uL   Monocytes Relative 5 %   Monocytes Absolute 0.3 0.1 - 1.0 K/uL   Eosinophils Relative 1 %   Eosinophils Absolute 0.0 0.0 - 0.5 K/uL   Basophils Relative 1 %   Basophils Absolute 0.1 0.0 - 0.1 K/uL   WBC Morphology MILD LEFT SHIFT (1-5% METAS, OCC MYELO, OCC BANDS)    Immature Granulocytes 1 %   Abs Immature Granulocytes 0.08 (H) 0.00 - 0.07 K/uL    Comment: Performed at [facility], [address] [place], [place] [address]  Blood culture (routine x 2)     Status: None (Preliminary result)   Collection Time: 07/01/18  1:19 AM  Result Value Ref Range   Specimen Description BLOOD LEFT ASSIST CONTROL    Special Requests      BOTTLES DRAWN AEROBIC AND ANAEROBIC Blood Culture results may not be optimal due to an excessive volume of blood received in culture  bottles   Culture      NO GROWTH <12 HOURS Performed at [facility], [address] [place], [place] [address]    Report Status PENDING   Blood gas, venous     Status: Abnormal   Collection Time: 07/01/18  1:31 AM  Result Value Ref Range   FIO2 0.70    Delivery systems BILEVEL POSITIVE AIRWAY PRESSURE    LHR 8 resp/min   pH, Ven 7.23 (L) 7.250 - 7.430   pCO2, Ven 35 (L) 44.0 - 60.0 mmHg   pO2, Ven 123.0 (H) 32.0 - 45.0 mmHg   Bicarbonate 14.7 (L) 20.0 - 28.0 mmol/L   Acid-base deficit 11.9 (H) 0.0 - 2.0 mmol/L   O2 Saturation 98.1 %   Patient temperature 37.0    Collection site VEIN    Sample type VENOUS     Comment: Performed at [facility], [address], [place], [place] [address]  Comprehensive metabolic panel     Status: Abnormal   Collection Time: 07/01/18  2:06 AM  Result Value Ref Range   Sodium 131 (L) 135 - 145 mmol/L   Potassium 3.8 3.5 - 5.1 mmol/L   Chloride 102 98 - 111 mmol/L   CO2 12 (L) 22 - 32 mmol/L   Glucose, Bld 88 70 - 99 mg/dL   BUN  QUANTITY NOT SUFFICIENT, UNABLE TO PERFORM TEST 6 - 20 mg/dL   Creatinine, Ser QUANTITY NOT SUFFICIENT, UNABLE TO PERFORM TEST 0.61 - 1.24 mg/dL   Calcium 7.9 (L) 8.9 - 10.3 mg/dL   Total Protein 7.7 6.5 - 8.1 g/dL   Albumin QUANTITY NOT SUFFICIENT, UNABLE TO PERFORM TEST 3.5 - 5.0 g/dL   AST 83 (H) 15 - 41 U/L   ALT 18 0 - 44 U/L   Alkaline Phosphatase 128 (H) 38 - 126 U/L   Total Bilirubin QUANTITY NOT SUFFICIENT, UNABLE TO PERFORM TEST 0.3 - 1.2 mg/dL   GFR calc non Af Amer NOT CALCULATED >60 mL/min   GFR calc Af Amer NOT CALCULATED >60 mL/min   Anion gap 17 (H) 5 - 15    Comment: Performed at [facility], [address] [place], [place] [address]  Lactic acid, plasma     Status: Abnormal   Collection Time: 07/01/18  2:06 AM  Result Value Ref Range   Lactic Acid, Venous 3.6 (HH) 0.5 - 1.9 mmol/L    Comment: CRITICAL RESULT CALLED TO, READ BACK BY AND VERIFIED WITH KATE Neizan Debruhl AT 3154 ON 07/01/2018 JJB Performed at [facility], [address] [place], [place] [address]   Troponin I -     Status: None   Collection Time: 07/01/18  2:06 AM  Result Value Ref Range   Troponin I QUANTITY NOT SUFFICIENT, UNABLE TO PERFORM TEST <0.03 ng/mL    Comment: Performed at [facility], [address] [place], [place] [address]   Dg Chest Portable 1 View  Result Date: 07/01/2018 CLINICAL DATA:  Shortness of breath EXAM: PORTABLE CHEST 1 VIEW COMPARISON:  04/16/2018 FINDINGS: There is a large airspace opacity involving much of the right lower lobe. There is likely a small parapneumonic effusion. There is a density projecting over the upper trachea which is felt to represent the patient's non-rebreather mass as opposed to a very high endotracheal tube. Involving the left lower lobe, however this is not well evaluated secondary to multiple cardiac leads. There is no definite pneumothorax. There is a linear density coursing across the patient's  right mid chest which is felt  to represent artifact as opposed to a pneumothorax. There is no acute osseous abnormality. IMPRESSION: 1. Large right lower lobe airspace opacity concerning for pneumonia. 2. Probable small right-sided parapneumonic effusion. 3. Additional small airspace opacity at the left lung base. Attention on follow-up examinations is recommended. Electronically Signed   By: Constance Holster M.D.   On: 07/01/2018 01:51    Pending Labs Unresulted Labs (From admission, onward)    Start     Ordered   07/01/18 0511  Lactic acid, plasma  STAT Now then every 3 hours,   STAT     07/01/18 0510   07/01/18 5027  Basic metabolic panel  Tomorrow morning,   STAT     07/01/18 0425   07/01/18 0500  CBC  Once,   R     07/01/18 0500   07/01/18 0441  Troponin I - ONCE - STAT  ONCE - STAT,   STAT     07/01/18 0441   07/01/18 0429  MRSA PCR Screening  Once,   STAT     07/01/18 0429   07/01/18 0409  Culture, sputum-assessment  Once,   STAT     07/01/18 0425   07/01/18 0409  Gram stain  Once,   STAT     07/01/18 0425   07/01/18 0409  Strep pneumoniae urinary antigen  Once,   STAT     07/01/18 0425   07/01/18 0317  Urinalysis, Routine w reflex microscopic  ONCE - STAT,   STAT     07/01/18 0317   07/01/18 0132  Blood culture (routine x 2)  BLOOD CULTURE X 2,   STAT     07/01/18 0131          Vitals/Pain Today's Vitals   07/01/18 0450 07/01/18 0500 07/01/18 0509 07/01/18 0600  BP:  (!) 101/93  (!) 131/17  Pulse: (!) 126  (!) 120   Resp: (!) 42 (!) 45 (!) 32 (!) 43  Temp:      TempSrc:      SpO2: 94%  98%   Weight:      Height:        Isolation Precautions Droplet and Contact precautions  Medications Medications  enoxaparin (LOVENOX) injection 40 mg (has no administration in time range)  0.9 %  sodium chloride infusion (has no administration in time range)  acetaminophen (TYLENOL) tablet 650 mg (has no administration in time range)    Or  acetaminophen (TYLENOL) suppository 650 mg (has no  administration in time range)  traZODone (DESYREL) tablet 25 mg (has no administration in time range)  ondansetron (ZOFRAN) tablet 4 mg (has no administration in time range)    Or  ondansetron (ZOFRAN) injection 4 mg (has no administration in time range)  ceFEPIme (MAXIPIME) 1 g in sodium chloride 0.9 % 100 mL IVPB (has no administration in time range)  azithromycin (ZITHROMAX) 500 mg in sodium chloride 0.9 % 250 mL IVPB (has no administration in time range)  guaiFENesin (MUCINEX) 12 hr tablet 600 mg (has no administration in time range)  ipratropium-albuterol (DUONEB) 0.5-2.5 (3) MG/3ML nebulizer solution 3 mL (has no administration in time range)  sodium chloride 0.9 % bolus 1,000 mL (0 mLs Intravenous Stopped 07/01/18 0317)  vancomycin (VANCOCIN) IVPB 1000 mg/200 mL premix (0 mg Intravenous Stopped 07/01/18 0443)  ceFEPIme (MAXIPIME) 1 g in sodium chloride 0.9 % 100 mL IVPB (0 g Intravenous Stopped 07/01/18 0401)  clindamycin (CLEOCIN) IVPB 600 mg (0  mg Intravenous Stopped 07/01/18 0443)  sodium chloride 0.9 % bolus 1,000 mL (0 mLs Intravenous Stopped 07/01/18 0401)  0.9 %  sodium chloride infusion ( Intravenous New Bag/Given 07/01/18 0430)    Mobility non-ambulatory Moderate fall risk   Focused Assessments Pulmonary Assessment Handoff:  Lung sounds:   O2 Device: Bi-PAP        R Recommendations: See Admitting Provider Note  Report given to:   Additional Notes:

## 2018-07-01 NOTE — ED Notes (Signed)
Dark red blood noted in OG tubing

## 2018-07-01 NOTE — ED Notes (Signed)
Pulses regained and pt moved to ICU

## 2018-07-01 NOTE — ED Notes (Signed)
Pt emergency contact (mother) notified of pt condition

## 2018-07-01 NOTE — H&P (Addendum)
[place] [facility] [place] NAME: Joshua Dougherty    MR#:  [id number]  DATE OF BIRTH:  Oct 13, 1970  DATE OF ADMISSION:  07/01/2018  PRIMARY CARE PHYSICIAN: Patient, No Pcp Per   REQUESTING/REFERRING PHYSICIAN: Rudene Re, MD  CHIEF COMPLAINT:   Chief Complaint  Patient presents with  . Altered Mental Status  . Respiratory Distress    HISTORY OF PRESENT ILLNESS:  Joshua Dougherty  is a 48 y.o. African-American male with a known history of HIV and TB, who presented to the emergency room with a concern of acute respiratory distress with associated cough and dyspnea without wheezing.  He denied any fever or chills.  No nausea vomiting or abdominal pain.  No chest pain or palpitations.  No dysuria, oliguria or hematuria or flank pain. Per EMS, patient's family called the sheriff's department because they had not heard from him in several days.  Fire department found patient confused, lying supine on the ground with difficulty breathing.  He was placed on nonrebreather and transferred to the ER.  Upon arrival here his heart rate was 141 with a respiratory 52 and blood pressure one 104/73.  His pulse was 65 pulse extremity was 95% on nonrebreather.  He was then placed on BiPAP.  His venous blood gas showed a bicarbonate of 14.7 and his CMP was remarkable for anion gap of 17 and AST of 83.  His COVID-19 test came back negative.  Chest x-ray showed large right lower lobe pneumonia and possibly small right parapneumonic effusion with small airspace disease in the left base.  He had blood cultures drawn and was given IV cefepime, vancomycin and clindamycin.  He will be admitted to a stepdown unit for further evaluation and management.   PAST MEDICAL HISTORY:   Past Medical History:  Diagnosis Date  . HIV (human immunodeficiency virus infection) ([place])   . Tuberculosis     PAST SURGICAL HISTORY:   Past Surgical History:  Procedure Laterality Date  .  ABSCESS DRAINAGE  2013   chest wall; patient states it was not contiguous with his lung however this was an operation under general anesthesia  . FLEXIBLE BRONCHOSCOPY Right 03/28/2018   Procedure: FLEXIBLE BRONCHOSCOPY, RIGHT;  Surgeon: Laverle Hobby, MD;  Location: [facility] ORS;  Service: Pulmonary;  Laterality: Right;    SOCIAL HISTORY:   Social History   Tobacco Use  . Smoking status: Current Every Day Smoker    Packs/day: 0.25  . Smokeless tobacco: Current User  Substance Use Topics  . Alcohol use: Yes    Alcohol/week: 2.0 - 3.0 standard drinks    Types: 2 - 3 Standard drinks or equivalent per week    Frequency: Never    FAMILY HISTORY:  No remarkable familial diseases.  DRUG ALLERGIES:  No Known Allergies  REVIEW OF SYSTEMS:   ROS As per history of present illness. All pertinent systems were reviewed above. Constitutional,  HEENT, cardiovascular, respiratory, GI, GU, musculoskeletal, neuro, psychiatric, endocrine,  integumentary and hematologic systems were reviewed and are otherwise  negative/unremarkable except for positive findings mentioned above in the HPI.   MEDICATIONS AT HOME:   Prior to Admission medications   Not on File      VITAL SIGNS:  Blood pressure 93/79, pulse (!) 104, temperature 99.3 F (37.4 C), temperature source Axillary, resp. rate (!) 40, height 6' (1.829 m), weight 67 kg, SpO2 100 %.  PHYSICAL EXAMINATION:  Physical Exam  GENERAL:  48 y.o.-year-old acutely ill  African-American male patient lying in the bed with moderate distress on BiPAP EYES: Pupils equal, round, reactive to light and accommodation. No scleral icterus. Extraocular muscles intact.  HEENT: Head atraumatic, normocephalic. Oropharynx: BiPAP mask in place. NECK:  Supple, no jugular venous distention. No thyroid enlargement, no tenderness.  LUNGS: Diminished bibasilar breath sounds more remarkable on the right base with associated crackles. CARDIOVASCULAR: Regular  rate and rhythm, S1, S2 normal. No murmurs, rubs, or gallops.  ABDOMEN: Soft, nondistended, nontender. Bowel sounds present. No organomegaly or mass.  EXTREMITIES: No pedal edema, cyanosis, or clubbing.  NEUROLOGIC: Cranial nerves II through XII are intact. Muscle strength 5/5 in all extremities. Sensation intact. Gait not checked.  PSYCHIATRIC: The patient is alert and oriented x 3.  Normal affect and good eye contact. SKIN: No obvious rash, lesion, or ulcer.   LABORATORY PANEL:   CBC Recent Labs  Lab 07/01/18 0119  WBC 6.4  HGB 15.6  HCT 45.0  PLT 270   ------------------------------------------------------------------------------------------------------------------  Chemistries  Recent Labs  Lab 07/01/18 0206  NA 131*  K 3.8  CL 102  CO2 12*  GLUCOSE 88  BUN QUANTITY NOT SUFFICIENT, UNABLE TO PERFORM TEST  CREATININE QUANTITY NOT SUFFICIENT, UNABLE TO PERFORM TEST  CALCIUM 7.9*  AST 83*  ALT 18  ALKPHOS 128*  BILITOT QUANTITY NOT SUFFICIENT, UNABLE TO PERFORM TEST   ------------------------------------------------------------------------------------------------------------------  Cardiac Enzymes Recent Labs  Lab 07/01/18 0206  TROPONINI QUANTITY NOT SUFFICIENT, UNABLE TO PERFORM TEST   ------------------------------------------------------------------------------------------------------------------  RADIOLOGY:  Dg Chest Portable 1 View  Result Date: 07/01/2018 CLINICAL DATA:  Shortness of breath EXAM: PORTABLE CHEST 1 VIEW COMPARISON:  04/16/2018 FINDINGS: There is a large airspace opacity involving much of the right lower lobe. There is likely a small parapneumonic effusion. There is a density projecting over the upper trachea which is felt to represent the patient's non-rebreather mass as opposed to a very high endotracheal tube. Involving the left lower lobe, however this is not well evaluated secondary to multiple cardiac leads. There is no definite  pneumothorax. There is a linear density coursing across the patient's right mid chest which is felt to represent artifact as opposed to a pneumothorax. There is no acute osseous abnormality. IMPRESSION: 1. Large right lower lobe airspace opacity concerning for pneumonia. 2. Probable small right-sided parapneumonic effusion. 3. Additional small airspace opacity at the left lung base. Attention on follow-up examinations is recommended. Electronically Signed   By: Constance Holster M.D.   On: 07/01/2018 01:51      IMPRESSION AND PLAN:   1.  Right lower lobe community-acquired pneumonia in the setting of HIV.  The patient will be admitted to a stepdown unit.  We will continue antibiotic therapy with IV cefepime, Zithromax and vancomycin.  Will obtain sputum Gram stain culture and sensitivity and follow-up blood cultures.  He will be placed on scheduled duo nebs and mucolytic's.  The patient's case was discussed with the e- link ICU attending.  2.  Acute respiratory failure, secondary to #1.  He will be continued on BiPAP for now and will follow ABG.  3.  Sepsis secondary to #1.  This is manifested by his tachycardia and tachypnea.  Management as above.  We will follow lactic acid that is currently elevated.  4.  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  We will check his viral load and T4 cell count.  He is apparently on no antiretroviral therapy.  5.  DVT prophylaxis.  Subcutaneous  Lovenox  All the records are reviewed and case discussed with ED provider. The plan of care was discussed in details with the patient (and family). I answered all questions. The patient agreed to proceed with the above mentioned plan. Further management will depend upon hospital course.   CODE STATUS: Full code  TOTAL TIME TAKING CARE OF THIS PATIENT: 55 minutes.    Christel Mormon M.D on 07/01/2018 at 4:59 AM  Pager - [phone]  After 6pm go to [email or web address] - Proofreader  [employer]  [facility]  Office  [phone]  CC: Primary care physician; Patient, No Pcp Per   Note: This dictation was prepared with Dragon dictation along with smaller phrase technology. Any transcriptional errors that result from this process are unintentional.

## 2018-07-01 NOTE — Progress Notes (Addendum)
CODE SEPSIS - PHARMACY COMMUNICATION  **Broad Spectrum Antibiotics should be administered within 1 hour of Sepsis diagnosis**  Time Code Sepsis Called/Page Received: @ 2761  Antibiotics Ordered: Cefepime, vancomycin, clindamycin   Time of 1st antibiotic administration: @ [phone]  Additional action taken by pharmacy: N/A  If necessary, Name of Provider/Nurse Contacted: N/A   Pernell Dupre, PharmD, BCPS Clinical Pharmacist 07/01/2018 3:41 AM

## 2018-07-01 NOTE — Consult Note (Signed)
Pharmacy Antibiotic Note  Joshua Dougherty is a 48 y.o. male admitted on 07/01/2018 with respiratory distress and pneumonia.  Pharmacy has been consulted for meropenem dosing. He was originally started on vancomycin and cefepime for PNA but has since grown out multiple species in his blood cultures. SCr this morning 5.56. Last Scr on 2/27 was 0.70.   Patient had PMH of HIV and TB (noted to have been treated)  Plan: Start meropenem 500 mg IV every 12 hours  Height: 6' (182.9 cm) Weight: 147 lb 11.3 oz (67 kg) IBW/kg (Calculated) : 77.6  Temp (24hrs), Avg:98.5 F (36.9 C), Min:98.3 F (36.8 C), Max:99.3 F (37.4 C)  Recent Labs  Lab 07/01/18 0119 07/01/18 0206 07/01/18 0431 07/01/18 0624 07/01/18 1400  WBC 6.4  --   --  2.4* PENDING  CREATININE  --  QUANTITY NOT SUFFICIENT, UNABLE TO PERFORM TEST 5.56*  --  5.98*  LATICACIDVEN  --  3.6*  --  3.0*  --     Estimated Creatinine Clearance: 14.3 mL/min (A) (by C-G formula based on SCr of 5.98 mg/dL (H)).    No Known Allergies  Antimicrobials this admission: Azithromycin 6/8> Meropenem 6/9> Cefepime 6/8>6/9 Vanc 6/8> 6/9  Microbiology results: 6/9 RCx pending 6/9 Bcx E coli, MSSA, grpB strep 6/9 MRSA PCR: pending  Thank you for allowing pharmacy to be a part of this patient's care.  Dallie Piles, PharmD Clinical Pharmacist 07/01/2018 2:27 PM

## 2018-07-01 NOTE — ED Notes (Signed)
Pt intubated with 7 1/2 with 23 at teeth  16 French OG 75 at lip

## 2018-07-01 NOTE — ED Notes (Addendum)
Spoke with patient's mother, Stanton Kidney. Informed her that patient will be admitted to the hospital

## 2018-07-01 NOTE — Consult Note (Addendum)
Name: Joshua Dougherty MRN: [id number] DOB: 17-Jul-1970    ADMISSION DATE:  07/01/2018 CONSULTATION DATE: 07/01/2018  REFERRING MD : Dr. Sidney Ace   CHIEF COMPLAINT: Shortness of Breath   BRIEF PATIENT DESCRIPTION:  48 yo male with HIV admitted with acute hypoxic respiratory failure secondary to RLL pneumonia requiring Bipap   SIGNIFICANT EVENTS/STUDIES:  06/9-Pt admitted to the stepdown unit on Bipap   HISTORY OF PRESENT ILLNESS:   This is a 48 yo male with a PMH of Tuberculosis (dx 02/2018 underwent bronchoscopy for sampling on 03/28/2018 by Dr. Ashby Dawes) and HIV (currently not on treatment).  He presented to [facility] ER on 06/9 via EMS with severe respiratory distress.  Per ER notes pts family called the sheriff's department to perform a wellness check because they had not heard from the pt in several days.  The fire department found the pt confused and lying supine on the ground with c/o difficulty breathing.  Pt placed on NRB en route to the ER.  Upon arrival to the ER pt transitioned to Bipap due to continued respiratory failure.  Lab results revealed Na+ 131, CO2 12, lactic acid 3.6, COVID-19 negative, and vbg pH 7.23/pCO2 35.  CXR concerning for right lower lobe airspace opacity likely secondary to pneumonia and probable small right-sided parapneumonic effusion.  Therefore, pt received 2L NS bolus, vancomycin, cefepime, and clindamycin.  He was originally admitted to the stepdown unit by hospitalist team for additional workup and treatment.  However, the patient had worsening respiratory status and required intubation in the emergency room.  He is now intubated and mechanically ventilated.  No further history can be obtained.  He did have a transient arrest of less than a minute where he became pulseless and required epinephrine and atropine.  He has been pressor dependent since that episode.  Now requiring norepinephrine and vasopressin.  PAST MEDICAL HISTORY :   has a past medical history of HIV  (human immunodeficiency virus infection) ([place]) and Tuberculosis.  has a past surgical history that includes Abscess drainage (2013) and Flexible bronchoscopy (Right, 03/28/2018). Prior to Admission medications   Not on File  Patient cannot verify.  No Known Allergies   Per prior records  FAMILY HISTORY:  family history is not on file.  Patient cannot provide.  SOCIAL HISTORY:  reports that he has been smoking. He has been smoking about 0.25 packs per day. He uses smokeless tobacco. He reports current alcohol use of about 2.0 - 3.0 standard drinks of alcohol per week. He reports that he does not use drugs.  REVIEW OF SYSTEMS:   She cannot provide review of systems due to being intubated and mechanically ventilated.  Patient is unresponsive at present.   SUBJECTIVE: Patient unresponsive intubated and mechanically ventilated.  VITAL SIGNS: Temp:  [99.3 F (37.4 C)] 99.3 F (37.4 C) (06/09 0157) Pulse Rate:  [65-141] 126 (06/09 0450) Resp:  [29-56] 42 (06/09 0450) BP: (93-130)/(61-82) 93/79 (06/09 0440) SpO2:  [90 %-100 %] 94 % (06/09 0450) Weight:  [67 kg] 67 kg (06/09 0121)  PHYSICAL EXAMINATION: General: Thin phonically ill-appearing male, unresponsive, intubated mechanically ventilated. Neuro: Unresponsive, that is being held.  Not assess further. HEENT: Bilateral temporal wasting, anicteric sclera.  Conjunctiva pale. Cardiovascular: Tachycardic, regular rate, no rubs murmurs gallops heard. Lungs: Diffuse rhonchi throughout.  Diminished breath sounds on the right base this. Abdomen: Scaphoid, soft, nondistended, normoactive bowel sounds.  Cannot assess tenderness as the patient is unresponsive. Musculoskeletal: Decreased muscle mass throughout.  No deformities noted.  Skin: Warm, dry, rashes noted.  Recent Labs  Lab 07/01/18 0206  NA 131*  K 3.8  CL 102  CO2 12*  BUN QUANTITY NOT SUFFICIENT, UNABLE TO PERFORM TEST  CREATININE QUANTITY NOT SUFFICIENT, UNABLE TO PERFORM  TEST  GLUCOSE 88   Recent Labs  Lab 07/01/18 0119  HGB 15.6  HCT 45.0  WBC 6.4  PLT 270   Dg Chest Portable 1 View  Result Date: 07/01/2018 CLINICAL DATA:  Shortness of breath EXAM: PORTABLE CHEST 1 VIEW COMPARISON:  04/16/2018 FINDINGS: There is a large airspace opacity involving much of the right lower lobe. There is likely a small parapneumonic effusion. There is a density projecting over the upper trachea which is felt to represent the patient's non-rebreather mass as opposed to a very high endotracheal tube. Involving the left lower lobe, however this is not well evaluated secondary to multiple cardiac leads. There is no definite pneumothorax. There is a linear density coursing across the patient's right mid chest which is felt to represent artifact as opposed to a pneumothorax. There is no acute osseous abnormality. IMPRESSION: 1. Large right lower lobe airspace opacity concerning for pneumonia. 2. Probable small right-sided parapneumonic effusion. 3. Additional small airspace opacity at the left lung base. Attention on follow-up examinations is recommended. Electronically Signed   By: Constance Holster M.D.   On: 07/01/2018 01:51    ASSESSMENT / PLAN:  Acute hypoxic respiratory failure secondary to RLL pneumonia and metabolic acidosis  Continue ventilator support.  Changes were made as the patient showed significant respiratory acidosis and metabolic acidosis. Every 4 bronchodilators for mucociliary clearance Trend WBC and monitor fever curve Trend PCT and lactic acid Follow cultures  Continue cefepime if MRSA PCR negative will discontinue vancomycin  Metabolic acidosis-lactic acidosis Trend BMP and ABG Replace electrolytes as indicated  Initiate bicarbonate drip. Monitor UOP Avoid nephrotoxic medications   Acute renal failure This is likely due to multiorgan failure from severe sepsis Element of ATN Supportive care/ Volume resuscitation  HIV positive/immune compromised  state This issue adds complexity to his management Infectious disease consultation to assist with management of acute infection in the setting of HIV  Severe sepsis with septic shock Continue pressors and supportive care 2D echo to evaluate for potential cardiomyopathy   Prophylaxis: SCDs, subcu heparin for DVT and PPI for GI.  Critical care time 60 minutes.  Patient is critically ill with multiorgan failure and at risk for further deterioration and death.  Prognosis is exceedingly guarded.   This chart was dictated using voice recognition software/Dragon.  Despite best efforts to proofread, errors can occur which can change the meaning.  Any change was purely unintentional.

## 2018-07-01 NOTE — ED Notes (Signed)
Lab at bedside to redraw lt green and lav tubes

## 2018-07-02 ENCOUNTER — Inpatient Hospital Stay (HOSPITAL_COMMUNITY)
Admit: 2018-07-02 | Discharge: 2018-07-02 | Disposition: A | Discharge status: Home / Self Care | Payer: Medicaid Other | Attending: Pulmonary Disease | Admitting: Pulmonary Disease

## 2018-07-02 DIAGNOSIS — I340 Nonrheumatic mitral (valve) insufficiency: Secondary | ICD-10-CM

## 2018-07-02 DIAGNOSIS — I371 Nonrheumatic pulmonary valve insufficiency: Secondary | ICD-10-CM

## 2018-07-02 LAB — RENAL FUNCTION PANEL
Albumin: 1.5 g/dL — ABNORMAL LOW (ref 3.5–5.0)
Albumin: 1.5 g/dL — ABNORMAL LOW (ref 3.5–5.0)
Anion gap: 19 — ABNORMAL HIGH (ref 5–15)
Anion gap: 14 (ref 5–15)
BUN: 106 mg/dL — ABNORMAL HIGH (ref 6–20)
BUN: 90 mg/dL — ABNORMAL HIGH (ref 6–20)
CO2: 20 mmol/L — ABNORMAL LOW (ref 22–32)
CO2: 23 mmol/L (ref 22–32)
Calcium: 6.4 mg/dL — CL (ref 8.9–10.3)
Calcium: 6.7 mg/dL — ABNORMAL LOW (ref 8.9–10.3)
Chloride: 95 mmol/L — ABNORMAL LOW (ref 98–111)
Chloride: 98 mmol/L (ref 98–111)
Creatinine, Ser: 5.75 mg/dL — ABNORMAL HIGH (ref 0.61–1.24)
Creatinine, Ser: 4.7 mg/dL — ABNORMAL HIGH (ref 0.61–1.24)
GFR calc Af Amer: 12 mL/min — ABNORMAL LOW (ref >60)
GFR calc Af Amer: 16 mL/min — ABNORMAL LOW (ref >60)
GFR calc non Af Amer: 11 mL/min — ABNORMAL LOW (ref >60)
GFR calc non Af Amer: 14 mL/min — ABNORMAL LOW (ref >60)
Glucose, Bld: 263 mg/dL — ABNORMAL HIGH (ref 70–99)
Glucose, Bld: 218 mg/dL — ABNORMAL HIGH (ref 70–99)
Phosphorus: 7.2 mg/dL — ABNORMAL HIGH (ref 2.5–4.6)
Phosphorus: 5.3 mg/dL — ABNORMAL HIGH (ref 2.5–4.6)
Potassium: 3.8 mmol/L (ref 3.5–5.1)
Potassium: 3.7 mmol/L (ref 3.5–5.1)
Sodium: 134 mmol/L — ABNORMAL LOW (ref 135–145)
Sodium: 135 mmol/L (ref 135–145)

## 2018-07-02 LAB — BASIC METABOLIC PANEL
Anion gap: 16 — ABNORMAL HIGH (ref 5–15)
BUN: 103 mg/dL — ABNORMAL HIGH (ref 6–20)
CO2: 18 mmol/L — ABNORMAL LOW (ref 22–32)
Calcium: 6.4 mg/dL — CL (ref 8.9–10.3)
Chloride: 100 mmol/L (ref 98–111)
Creatinine, Ser: 5.97 mg/dL — ABNORMAL HIGH (ref 0.61–1.24)
GFR calc Af Amer: 12 mL/min — ABNORMAL LOW (ref >60)
GFR calc non Af Amer: 10 mL/min — ABNORMAL LOW (ref >60)
Glucose, Bld: 201 mg/dL — ABNORMAL HIGH (ref 70–99)
Potassium: 4.3 mmol/L (ref 3.5–5.1)
Sodium: 134 mmol/L — ABNORMAL LOW (ref 135–145)

## 2018-07-02 LAB — ECHOCARDIOGRAM COMPLETE
Height: 72 in
Weight: 2363.33 oz

## 2018-07-02 LAB — BLOOD GAS, ARTERIAL
Acid-base deficit: 9.5 mmol/L — ABNORMAL HIGH (ref 0.0–2.0)
Bicarbonate: 19 mmol/L — ABNORMAL LOW (ref 20.0–28.0)
FIO2: 100
MECHVT: 500 mL
Mechanical Rate: 30
O2 Saturation: 96.2 %
PEEP: 8 cmH2O
Patient temperature: 37
pCO2 arterial: 51 mmHg — ABNORMAL HIGH (ref 32.0–48.0)
pH, Arterial: 7.18 — CL (ref 7.350–7.450)
pO2, Arterial: 103 mmHg (ref 83.0–108.0)

## 2018-07-02 LAB — CBC
HCT: 35 % — ABNORMAL LOW (ref 39.0–52.0)
Hemoglobin: 12.3 g/dL — ABNORMAL LOW (ref 13.0–17.0)
MCH: 33.4 pg (ref 26.0–34.0)
MCHC: 35.1 g/dL (ref 30.0–36.0)
MCV: 95.1 fL (ref 80.0–100.0)
Platelets: 152 10*3/uL (ref 150–400)
RBC: 3.68 MIL/uL — ABNORMAL LOW (ref 4.22–5.81)
RDW: 14.8 % (ref 11.5–15.5)
WBC: 12 10*3/uL — ABNORMAL HIGH (ref 4.0–10.5)
nRBC: 0 % (ref 0.0–0.2)

## 2018-07-02 LAB — MAGNESIUM
Magnesium: 2.6 mg/dL — ABNORMAL HIGH (ref 1.7–2.4)
Magnesium: 2.4 mg/dL (ref 1.7–2.4)
Magnesium: 2.1 mg/dL (ref 1.7–2.4)

## 2018-07-02 LAB — GLUCOSE, CAPILLARY
Glucose-Capillary: 154 mg/dL — ABNORMAL HIGH (ref 70–99)
Glucose-Capillary: 129 mg/dL — ABNORMAL HIGH (ref 70–99)
Glucose-Capillary: 233 mg/dL — ABNORMAL HIGH (ref 70–99)
Glucose-Capillary: 236 mg/dL — ABNORMAL HIGH (ref 70–99)
Glucose-Capillary: 127 mg/dL — ABNORMAL HIGH (ref 70–99)

## 2018-07-02 LAB — PHOSPHORUS: Phosphorus: 8.5 mg/dL — ABNORMAL HIGH (ref 2.5–4.6)

## 2018-07-02 LAB — TRIGLYCERIDES: Triglycerides: 259 mg/dL — ABNORMAL HIGH (ref <150)

## 2018-07-02 MED ORDER — SODIUM CHLORIDE 0.9 % IV SOLN
INTRAVENOUS | Status: DC | PRN
  Administered 2018-07-02 (×2): 250 mL via INTRAVENOUS
  Administered 2018-07-04: 500 mL via INTRAVENOUS

## 2018-07-02 MED ORDER — INSULIN ASPART 100 UNIT/ML ~~LOC~~ SOLN
0.0000 [IU] | SUBCUTANEOUS | Status: DC
  Administered 2018-07-02 – 2018-07-04 (×6): 2 [IU] via SUBCUTANEOUS
  Administered 2018-07-04: 04:00:00 3 [IU] via SUBCUTANEOUS
  Administered 2018-07-05 – 2018-07-06 (×4): 2 [IU] via SUBCUTANEOUS
  Administered 2018-07-06 (×3): 3 [IU] via SUBCUTANEOUS
  Administered 2018-07-07 – 2018-07-08 (×8): 2 [IU] via SUBCUTANEOUS
  Filled 2018-07-02 (×22): qty 1

## 2018-07-02 MED ORDER — CALCIUM GLUCONATE-NACL 2-0.675 GM/100ML-% IV SOLN
2.0000 g | Freq: Once | INTRAVENOUS | Status: AC
  Administered 2018-07-02: 2000 mg via INTRAVENOUS
  Filled 2018-07-02: qty 100

## 2018-07-02 MED ORDER — INSULIN ASPART 100 UNIT/ML ~~LOC~~ SOLN
0.0000 [IU] | Freq: Four times a day (QID) | SUBCUTANEOUS | Status: DC
  Administered 2018-07-02 (×2): 3 [IU] via SUBCUTANEOUS
  Filled 2018-07-02 (×2): qty 1

## 2018-07-02 MED ORDER — PUREFLOW DIALYSIS SOLUTION
INTRAVENOUS | Status: DC
  Administered 2018-07-02 – 2018-07-03 (×4): via INTRAVENOUS_CENTRAL
  Administered 2018-07-04: 3 via INTRAVENOUS_CENTRAL
  Administered 2018-07-04 (×4): via INTRAVENOUS_CENTRAL
  Administered 2018-07-05: 3 via INTRAVENOUS_CENTRAL

## 2018-07-02 MED ORDER — SODIUM CHLORIDE 0.9 % IV SOLN
1.0000 g | Freq: Three times a day (TID) | INTRAVENOUS | Status: DC
  Administered 2018-07-02 – 2018-07-03 (×2): 1 g via INTRAVENOUS
  Filled 2018-07-02 (×4): qty 1

## 2018-07-02 MED ORDER — NOREPINEPHRINE BITARTRATE 1 MG/ML IV SOLN
0.0000 ug/min | INTRAVENOUS | Status: DC
  Administered 2018-07-02: 40 ug/min via INTRAVENOUS
  Administered 2018-07-03: 01:00:00 32 ug/min via INTRAVENOUS
  Administered 2018-07-04: 5 ug/min via INTRAVENOUS
  Administered 2018-07-04 – 2018-07-05 (×2): 2 ug/min via INTRAVENOUS
  Filled 2018-07-02 (×3): qty 16

## 2018-07-02 MED ORDER — CALCIUM GLUCONATE-NACL 2-0.675 GM/100ML-% IV SOLN
2.0000 g | Freq: Once | INTRAVENOUS | Status: AC
  Administered 2018-07-02: 12:00:00 2000 mg via INTRAVENOUS
  Filled 2018-07-02: qty 100

## 2018-07-02 MED ORDER — HEPARIN SODIUM (PORCINE) 1000 UNIT/ML DIALYSIS
1000.0000 [IU] | INTRAMUSCULAR | Status: DC | PRN
  Administered 2018-07-03 – 2018-07-04 (×2): 1000 [IU] via INTRAVENOUS_CENTRAL
  Administered 2018-07-05 (×2): 1400 [IU] via INTRAVENOUS_CENTRAL
  Filled 2018-07-02 (×2): qty 6
  Filled 2018-07-02: qty 4
  Filled 2018-07-02: qty 6
  Filled 2018-07-02: qty 3
  Filled 2018-07-02 (×2): qty 6
  Filled 2018-07-02: qty 3
  Filled 2018-07-02: qty 6

## 2018-07-02 NOTE — Progress Notes (Signed)
Follow up - Critical Care Medicine Note  Patient Details:    Joshua Dougherty is an 48 y.o. male with a PMH of Tuberculosis(dx 02/2018 underwent bronchoscopy for sampling on 03/28/2018 by Dr. Mattie Marlin HIV (currently not on treatment). He presented to [facility] ER on 06/9 via EMS withsevere respiratory distress.  Intubated and mechanically ventilated has polymicrobial bacteremia  Lines, Airways, Drains: Airway 7.5 mm (Active)  Secured at (cm) 23 cm 07/02/2018  8:55 PM  Measured From Lips 07/02/2018  8:55 PM  Secured Location Left 07/02/2018  8:55 PM  Secured By [employer] 07/02/2018  8:55 PM  Tube Holder Repositioned Yes 07/02/2018  8:55 PM  Cuff Pressure (cm H2O) 24 cm H2O 07/02/2018  8:55 PM  Site Condition Cool;Dry 07/02/2018  8:55 PM     CVC Triple Lumen 07/01/18 Right Femoral (Active)  Indication for Insertion or Continuance of Line Vasoactive infusions;Poor Vasculature-patient has had multiple peripheral attempts or PIVs lasting less than 24 hours 07/02/2018  8:00 PM  Site Assessment Clean;Dry;Intact 07/02/2018  8:00 PM  Proximal Lumen Status Infusing;Flushed;Blood return noted 07/02/2018  8:00 PM  Medial Lumen Status Infusing;Flushed;Blood return noted 07/02/2018  8:00 PM  Distal Lumen Status Flushed;Blood return noted;In-line blood sampling system in place 07/02/2018  8:00 PM  Dressing Type Transparent;Occlusive;Securing device 07/02/2018  8:00 PM  Dressing Status Intact;Dry;Antimicrobial disc in place;Clean 07/02/2018  8:00 PM  [place] pulled back;Connections checked and tightened 07/02/2018  8:00 PM  Dressing Change Due 07/07/18 07/02/2018  8:00 AM     NG/OG Tube Orogastric 16 Fr. Right mouth Xray (Active)  Cm Marking at Nare/Corner of Mouth (if applicable) 72 cm 2/59/5638  8:00 PM  Site Assessment Clean;Dry;Intact 07/02/2018  8:00 PM  Ongoing Placement Verification No change in cm markings or external length of tube from initial placement;No change in respiratory status;No  acute changes, not attributed to clinical condition 07/02/2018  8:00 PM  Status Suction-low intermittent 07/02/2018  8:00 PM  Amount of suction 80 mmHg 07/02/2018  8:00 PM  Drainage Appearance Green 07/02/2018  8:00 PM  Intake (mL) 0 mL 07/02/2018  8:00 AM  Output (mL) 50 mL 07/02/2018  8:00 PM     Urethral Catheter Megan RN  Non-latex 14 Fr. (Active)  Indication for Insertion or Continuance of Catheter Unstable critically ill patients first 24-48 hours (See Criteria) 07/02/2018  8:00 PM  Site Assessment Clean;Intact 07/02/2018  8:00 PM  Catheter Maintenance Bag below level of bladder;Catheter secured;Drainage bag/tubing not touching floor;Insertion date on drainage bag;Seal intact;No dependent loops 07/02/2018  8:00 PM  Collection Container Standard drainage bag 07/02/2018  8:00 PM  Securement Method Securing device (Describe) 07/02/2018  8:00 PM  Urinary Catheter Interventions Unclamped 07/02/2018  8:00 PM  Input (mL) 0 mL 07/02/2018  8:00 AM  Output (mL) 0 mL 07/02/2018  6:00 PM    Anti-infectives:  Anti-infectives (From admission, onward)   Start     Dose/Rate Route Frequency Ordered Stop   07/02/18 2200  meropenem (MERREM) 1 g in sodium chloride 0.9 % 100 mL IVPB     1 g 200 mL/hr over 30 Minutes Intravenous Every 8 hours 07/02/18 1546     07/01/18 2200  ceFEPIme (MAXIPIME) 2 g in sodium chloride 0.9 % 100 mL IVPB  Status:  Discontinued     2 g 200 mL/hr over 30 Minutes Intravenous Every 24 hours 07/01/18 0727 07/01/18 1432   07/01/18 1600  meropenem (MERREM) 500 mg in sodium chloride 0.9 % 100 mL IVPB  Status:  Discontinued     500 mg 200 mL/hr over 30 Minutes Intravenous Every 12 hours 07/01/18 1432 07/02/18 1546   07/01/18 1000  azithromycin (ZITHROMAX) 500 mg in sodium chloride 0.9 % 250 mL IVPB  Status:  Discontinued     500 mg 250 mL/hr over 60 Minutes Intravenous Every 24 hours 07/01/18 0425 07/02/18 1151   07/01/18 1000  vancomycin (VANCOCIN) 500 mg in sodium chloride 0.9 % 100 mL  IVPB     500 mg 100 mL/hr over 60 Minutes Intravenous  Once 07/01/18 0728 07/02/18 0812   07/01/18 0600  ceFEPIme (MAXIPIME) 1 g in sodium chloride 0.9 % 100 mL IVPB  Status:  Discontinued     1 g 200 mL/hr over 30 Minutes Intravenous Every 8 hours 07/01/18 0425 07/01/18 0727   07/01/18 0200  clindamycin (CLEOCIN) IVPB 600 mg     600 mg 100 mL/hr over 30 Minutes Intravenous  Once 07/01/18 0145 07/01/18 0443   07/01/18 0145  vancomycin (VANCOCIN) IVPB 1000 mg/200 mL premix     1,000 mg 200 mL/hr over 60 Minutes Intravenous  Once 07/01/18 0131 07/01/18 0443   07/01/18 0145  ceFEPIme (MAXIPIME) 1 g in sodium chloride 0.9 % 100 mL IVPB     1 g 200 mL/hr over 30 Minutes Intravenous  Once 07/01/18 0131 07/01/18 0401      Microbiology: Results for orders placed or performed during the hospital encounter of 07/01/18  SARS Coronavirus 2 (CEPHEID- Performed in [place] hospital lab), Hosp Order     Status: None   Collection Time: 07/01/18  1:19 AM  Result Value Ref Range Status   SARS Coronavirus 2 NEGATIVE NEGATIVE Final    Comment: (NOTE) If result is NEGATIVE SARS-CoV-2 target nucleic acids are NOT DETECTED. The SARS-CoV-2 RNA is generally detectable in upper and lower  respiratory specimens during the acute phase of infection. The lowest  concentration of SARS-CoV-2 viral copies this assay can detect is 250  copies / mL. A negative result does not preclude SARS-CoV-2 infection  and should not be used as the sole basis for treatment or other  patient management decisions.  A negative result may occur with  improper specimen collection / handling, submission of specimen other  than nasopharyngeal swab, presence of viral mutation(s) within the  areas targeted by this assay, and inadequate number of viral copies  (<250 copies / mL). A negative result must be combined with clinical  observations, patient history, and epidemiological information. If result is POSITIVE SARS-CoV-2 target  nucleic acids are DETECTED. The SARS-CoV-2 RNA is generally detectable in upper and lower  respiratory specimens dur ing the acute phase of infection.  Positive  results are indicative of active infection with SARS-CoV-2.  Clinical  correlation with patient history and other diagnostic information is  necessary to determine patient infection status.  Positive results do  not rule out bacterial infection or co-infection with other viruses. If result is PRESUMPTIVE POSTIVE SARS-CoV-2 nucleic acids MAY BE PRESENT.   A presumptive positive result was obtained on the submitted specimen  and confirmed on repeat testing.  While 2019 novel coronavirus  (SARS-CoV-2) nucleic acids may be present in the submitted sample  additional confirmatory testing may be necessary for epidemiological  and / or clinical management purposes  to differentiate between  SARS-CoV-2 and other Sarbecovirus currently known to infect humans.  If clinically indicated additional testing with an alternate test  methodology [phone]) is advised. The SARS-CoV-2 RNA is generally  detectable in upper and lower respiratory sp ecimens during the acute  phase of infection. The expected result is Negative. Fact Sheet for Patients:  StrictlyIdeas.no Fact Sheet for Healthcare Providers: BankingDealers.co.za This test is not yet approved or cleared by the [place] FDA and has been authorized for detection and/or diagnosis of SARS-CoV-2 by FDA under an Emergency Use Authorization (EUA).  This EUA will remain in effect (meaning this test can be used) for the duration of the COVID-19 declaration under Section 564(b)(1) of the Act, 21 U.S.C. section 360bbb-3(b)(1), unless the authorization is terminated or revoked sooner. Performed at [facility], [address] [place], [place] [address]   Blood culture (routine x 2)     Status: None (Preliminary result)   Collection  Time: 07/01/18  1:19 AM  Result Value Ref Range Status   Specimen Description   Final    BLOOD LEFT ANTECUBITAL Performed at [facility], [place] [address] [place], [place] [address]    Special Requests   Final    BOTTLES DRAWN AEROBIC AND ANAEROBIC Blood Culture results may not be optimal due to an excessive volume of blood received in culture bottles Performed at [facility], [address] [place], [place] [address]    Culture  Setup Time   Final    GRAM NEGATIVE RODS IN BOTH AEROBIC AND ANAEROBIC BOTTLES CRITICAL RESULT CALLED TO, READ BACK BY AND VERIFIED WITH: CHRISTINE KATSOUDAS AT 5027 07/01/18 SG Performed at [facility], [address] [place], [place] [address]    Culture   Final    Lonell Grandchild NEGATIVE RODS IDENTIFICATION AND SUSCEPTIBILITIES TO FOLLOW Performed at [facility], [place] [address], [place], [place] [address]    Report Status PENDING  Incomplete  Blood culture (routine x 2)     Status: Abnormal (Preliminary result)   Collection Time: 07/01/18  3:09 AM  Result Value Ref Range Status   Specimen Description   Final    BLOOD LEFT ARM Performed at [facility], [address], [place], [place] [address]    Special Requests   Final    BOTTLES DRAWN AEROBIC AND ANAEROBIC Blood Culture adequate volume Performed at [facility], [address] [place], [place] [address]    Culture  Setup Time   Final    GRAM POSITIVE COCCI IN BOTH AEROBIC AND ANAEROBIC BOTTLES GRAM NEGATIVE RODS IN BOTH AEROBIC AND ANAEROBIC BOTTLES CRITICAL RESULT CALLED TO, READ BACK BY AND VERIFIED WITH: CHRISTINE KATSOUDAS AT [id number] 07/01/18 SG    Culture (A)  Final    ESCHERICHIA COLI SUSCEPTIBILITIES TO FOLLOW GRAM POSITIVE COCCI CULTURE REINCUBATED FOR BETTER GROWTH Performed at [facility], [place] [address] [place],  [address]    Report Status PENDING  Incomplete  Blood Culture ID Panel (Reflexed)     Status: Abnormal    Collection Time: 07/01/18  3:09 AM  Result Value Ref Range Status   Enterococcus species NOT DETECTED NOT DETECTED Final   Listeria monocytogenes NOT DETECTED NOT DETECTED Final   Staphylococcus species DETECTED (A) NOT DETECTED Final    Comment: CRITICAL RESULT CALLED TO, READ BACK BY AND VERIFIED WITH: CHRISTINE KATSOUDAS AT 5465 07/01/18 SG    Staphylococcus aureus (BCID) DETECTED (A) NOT DETECTED Final    Comment: Methicillin (oxacillin) susceptible Staphylococcus aureus (MSSA). Preferred therapy is anti staphylococcal beta lactam antibiotic (Cefazolin or Nafcillin), unless clinically contraindicated. CRITICAL RESULT CALLED TO, READ BACK BY AND VERIFIED WITH: CHRISTINE  KATSOUDAS AT 1325 07/01/18 SG    Methicillin resistance NOT DETECTED NOT DETECTED Final   Streptococcus species DETECTED (A) NOT DETECTED Final    Comment: CRITICAL RESULT CALLED TO, READ BACK BY AND VERIFIED WITH: CHRISTINE KATSOUDAS AT 7262 07/01/18 SG    Streptococcus agalactiae DETECTED (A) NOT DETECTED Final    Comment: CRITICAL RESULT CALLED TO, READ BACK BY AND VERIFIED WITH: CHRISTINE KATSOUDAS AT 0355 07/01/18 SG    Streptococcus pneumoniae NOT DETECTED NOT DETECTED Final   Streptococcus pyogenes NOT DETECTED NOT DETECTED Final   Acinetobacter baumannii NOT DETECTED NOT DETECTED Final   Enterobacteriaceae species DETECTED (A) NOT DETECTED Final    Comment: Enterobacteriaceae represent a large family of gram-negative bacteria, not a single organism. CRITICAL RESULT CALLED TO, READ BACK BY AND VERIFIED WITH: CHRISTINE KATSOUDAS AT 9741 07/01/18 SG    Enterobacter cloacae complex NOT DETECTED NOT DETECTED Final   Escherichia coli DETECTED (A) NOT DETECTED Final    Comment: CRITICAL RESULT CALLED TO, READ BACK BY AND VERIFIED WITH: CHRISTINE KATSOUDAS AT 6384 07/01/18 SG    Klebsiella oxytoca NOT DETECTED NOT DETECTED Final   Klebsiella pneumoniae NOT DETECTED NOT DETECTED Final   Proteus species NOT DETECTED NOT  DETECTED Final   Serratia marcescens NOT DETECTED NOT DETECTED Final   Carbapenem resistance NOT DETECTED NOT DETECTED Final   Haemophilus influenzae NOT DETECTED NOT DETECTED Final   Neisseria meningitidis NOT DETECTED NOT DETECTED Final   Pseudomonas aeruginosa NOT DETECTED NOT DETECTED Final   Candida albicans NOT DETECTED NOT DETECTED Final   Candida glabrata NOT DETECTED NOT DETECTED Final   Candida krusei NOT DETECTED NOT DETECTED Final   Candida parapsilosis NOT DETECTED NOT DETECTED Final   Candida tropicalis NOT DETECTED NOT DETECTED Final    Comment: Performed at [facility], [address] [place], [place] [address]  MRSA PCR Screening     Status: None   Collection Time: 07/01/18  9:53 AM  Result Value Ref Range Status   MRSA by PCR NEGATIVE NEGATIVE Final    Comment:        The GeneXpert MRSA Assay (FDA approved for NASAL specimens only), is one component of a comprehensive MRSA colonization surveillance program. It is not intended to diagnose MRSA infection nor to guide or monitor treatment for MRSA infections. Performed at [facility], [address] [place], [place] [address]     Best Practice/Protocols:  VTE Prophylaxis: Mechanical GI Prophylaxis: Proton Pump Inhibitor Continous Sedation  Events:   Studies: Dg Chest Portable 1 View  Result Date: 07/01/2018 CLINICAL DATA:  Hypoxia EXAM: PORTABLE CHEST 1 VIEW COMPARISON:  July 01, 2018 study obtained earlier in the day FINDINGS: Note that a portion of the left base is not apparent on either this examination or the abdominal image obtained at this time. Endotracheal tube tip is 5.7 cm above the carina. Nasogastric tube tip and side port extend below the diaphragm. No pneumothorax. Visualized left lung is clear. There is extensive airspace consolidation throughout much of the right lung including essentially all of the right middle and lower lobes as well as a portion of the right upper lobe.  There is a right pleural effusion, stable. Heart size and pulmonary vascularity are normal. No adenopathy. No bone lesions. IMPRESSION: Tube positions as described without pneumothorax. Extensive airspace consolidation on the right with right pleural effusion, stable. Visualized left lung clear. Stable cardiac silhouette. Electronically Signed   By: Lowella Grip III M.D.   On:  07/01/2018 08:35   Dg Chest Portable 1 View  Result Date: 07/01/2018 CLINICAL DATA:  Shortness of breath EXAM: PORTABLE CHEST 1 VIEW COMPARISON:  04/16/2018 FINDINGS: There is a large airspace opacity involving much of the right lower lobe. There is likely a small parapneumonic effusion. There is a density projecting over the upper trachea which is felt to represent the patient's non-rebreather mass as opposed to a very high endotracheal tube. Involving the left lower lobe, however this is not well evaluated secondary to multiple cardiac leads. There is no definite pneumothorax. There is a linear density coursing across the patient's right mid chest which is felt to represent artifact as opposed to a pneumothorax. There is no acute osseous abnormality. IMPRESSION: 1. Large right lower lobe airspace opacity concerning for pneumonia. 2. Probable small right-sided parapneumonic effusion. 3. Additional small airspace opacity at the left lung base. Attention on follow-up examinations is recommended. Electronically Signed   By: Constance Holster M.D.   On: 07/01/2018 01:51   Dg Abd Portable 1 View  Result Date: 07/01/2018 CLINICAL DATA:  Orogastric tube placement EXAM: PORTABLE ABDOMEN - 1 VIEW COMPARISON:  None. FINDINGS: Orogastric tube tip and side port are in the stomach. There is no bowel dilatation or air-fluid level to suggest bowel obstruction. No free air. There is a right pleural effusion with extensive consolidation throughout the visualized right lung. Visualized left lung clear. IMPRESSION: Orogastric tube tip and side port  in stomach. No bowel obstruction or free air demonstrable. Extensive airspace consolidation with pleural effusion on the right. Electronically Signed   By: Lowella Grip III M.D.   On: 07/01/2018 08:36    Consults: Treatment Team:  Tsosie Billing, MD Anthonette Legato, MD   Subjective:    Overnight Issues:   Objective:  Vital signs for last 24 hours: Temp:  [97 F (36.1 C)-99.8 F (37.7 C)] 97.9 F (36.6 C) (06/10 2100) Pulse Rate:  [102-122] 103 (06/10 2100) Resp:  [24-33] 30 (06/10 2100) BP: (80-144)/(42-87) 121/85 (06/10 2100) SpO2:  [96 %-100 %] 98 % (06/10 2100) FiO2 (%):  [60 %-100 %] 60 % (06/10 2055)  Hemodynamic parameters for last 24 hours: CVP:  [0 mmHg-12 mmHg] 4 mmHg  Intake/Output from previous day: 06/09 0701 - 06/10 0700 In: 5804.2 [I.V.:4904.2; IV Piggyback:900] Out: 35 [Urine:35]  Intake/Output this shift: Total I/O In: 454.8 [I.V.:433.2; IV Piggyback:21.5] Out: 50 [Emesis/NG output:50]  Vent settings for last 24 hours: Vent Mode: PRVC FiO2 (%):  [60 %-100 %] 60 % Set Rate:  [30 bmp] 30 bmp Vt Set:  [500 mL] 500 mL PEEP:  [8 cmH20] 8 cmH20 Plateau Pressure:  [20 cmH20-26 cmH20] 22 cmH20  Physical Exam:  General: Thin phonically ill-appearing male, unresponsive, intubated mechanically ventilated. Neuro: Unresponsive, that is being held.  Not assess further. HEENT: Bilateral temporal wasting, anicteric sclera.  Conjunctiva pale. Cardiovascular: Tachycardic, regular rate, no rubs murmurs gallops heard. Lungs: Diffuse rhonchi throughout.  Diminished breath sounds on the right base this. Abdomen: Scaphoid, soft, nondistended, normoactive bowel sounds.  Cannot assess tenderness as the patient is unresponsive. Musculoskeletal: Decreased muscle mass throughout.  No deformities noted. Skin: Warm, dry, rashes noted.  Assessment/Plan:  Acute hypoxic respiratory failure secondary to RLL pneumonia and metabolic acidosis  Continue ventilator  support.  Changes were made as the patient showed significant respiratory acidosis and metabolic acidosis. Every 4h bronchodilators for mucociliary clearance Trend WBC and monitor fever curve Trend PCT and lactic acid Follow cultures  Continue cefepime if MRSA PCR  negative will discontinue vancomycin Ct chest when more stable  Metabolic acidosis-lactic acidosis Trend BMP, ABG and lactic acid Replace electrolytes as indicated  Initiate bicarbonate drip. Monitor UOP Avoid nephrotoxic medications   Acute renal failure This is likely due to multiorgan failure from severe sepsis Element of ATN Supportive care/ Volume resuscitation Nephrology consult  HIV positive/immune compromised state This issue adds complexity to his management Infectious disease consultation to assist with management of acute infection in the setting of HIV  Severe sepsis with septic shock Continue pressors and supportive care 2D echo to evaluate for potential cardiomyopathy   Prophylaxis: SCDs, subcu heparin for DVT and PPI for GI.   LOS: 1 day    Additional comments:Multidisciplinary rounds done  Critical Care Total Time*: 45 Minutes  C. Derrill Kay, MD Exie Parody PCCM 07/02/2018  *Care during the described time interval was provided by me and/or other providers on the critical care team.  I have reviewed this patient's available data, including medical history, events of note, physical examination and test results as part of my evaluation.

## 2018-07-02 NOTE — Consult Note (Signed)
[facility] CONSULT NOTE    Date: 07/02/2018                  Patient Name:  Joshua Dougherty  MRN: [id number]  DOB: [birth year]  Age / Sex: 48 y.o., male         PCP: Patient, No Pcp Per                 Service Requesting Consult: Critical Care                 Reason for Consult: Severe acute renal failure            History of Present Illness: Patient is a 48 y.o. male with a PMHx of HIV currently not on treatment, tuberculosis, history of abscess drainage, who was admitted to [facility] on 07/01/2018 for evaluation of altered mental status and unresponsiveness.  Apparently per notes it appears that Sheriff's department was called to the patient's home for a welfare check.  Patient was found confused and laying supine on the ground with shortness of breath.  After being brought here x-ray was performed which showed right lower lobe airspace opacity secondary to pneumonia as well as a parapneumonic effusion.  He was then admitted to the hospital but developed worsening respiratory status and required intubation in the emergency department.  He also had a transient arrest of less than 1 minute.  We are now asked to see him for evaluation management of acute renal failure.  His baseline creatinine is 0.65.  Upon presentation creatinine was 5.5 but now up to 5.97 with a BUN of 103.  He also has an elevated lactic acid level of 3.  Serum bicarbonate is 18 on a bicarbonate drip.  Patient has had very little urine output since admission.   Medications: Outpatient medications: No medications prior to admission.    Current medications: Current Facility-Administered Medications  Medication Dose Route Frequency Provider Last Rate Last Dose  . 0.9 %  sodium chloride infusion   Intravenous PRN Tyler Pita, MD   Stopped at 07/02/18 1229  . acetaminophen (TYLENOL) tablet 650 mg  650 mg Oral Q6H PRN Mansy, Jan A, MD   650 mg at 07/01/18 1524   Or  . acetaminophen (TYLENOL)  suppository 650 mg  650 mg Rectal Q6H PRN Mansy, Jan A, MD      . chlorhexidine gluconate (MEDLINE KIT) (PERIDEX) 0.12 % solution 15 mL  15 mL Mouth Rinse BID Tyler Pita, MD   15 mL at 07/02/18 0824  . fentaNYL 2578mg in NS 2585m(1022mml) infusion-PREMIX  0-400 mcg/hr Intravenous Continuous GonTyler PitaD   Stopped at 07/02/18 0813  . guaiFENesin (MUCINEX) 12 hr tablet 600 mg  600 mg Oral BID Mansy, Jan A, MD      . heparin injection 5,000 Units  5,000 Units Subcutaneous Q12H GonTyler PitaD   5,000 Units at 07/02/18 [phone] hydrocortisone sodium succinate (SOLU-CORTEF) 100 MG injection 100 mg  100 mg Intravenous Q8H GonTyler PitaD   100 mg at 07/02/18 1200  . insulin aspart (novoLOG) injection 0-9 Units  0-9 Units Subcutaneous Q6H GruDallie PilesPH   3 Units at 07/02/18 1159  . ipratropium-albuterol (DUONEB) 0.5-2.5 (3) MG/3ML nebulizer solution 3 mL  3 mL Nebulization Q4H GonTyler PitaD   3 mL at 07/02/18 1120  . MEDLINE mouth rinse  15 mL Mouth Rinse 10 times per day GonVernard Gambles  L, MD   15 mL at 07/02/18 1143  . meropenem (MERREM) 500 mg in sodium chloride 0.9 % 100 mL IVPB  500 mg Intravenous Q12H Dallie Piles, RPH 200 mL/hr at 07/02/18 0503 500 mg at 07/02/18 0503  . norepinephrine (LEVOPHED) 16 mg in 266m premix infusion  0-40 mcg/min Intravenous Titrated GTyler Pita MD 37.5 mL/hr at 07/02/18 1300 40 mcg/min at 07/02/18 1300  . ondansetron (ZOFRAN) tablet 4 mg  4 mg Oral Q6H PRN Mansy, Jan A, MD       Or  . ondansetron ([facility] injection 4 mg  4 mg Intravenous Q6H PRN Mansy, Jan A, MD      . pantoprazole (PROTONIX) injection 40 mg  40 mg Intravenous Q24H GTyler Pita MD   40 mg at 07/01/18 1650  . propofol (DIPRIVAN) 1000 MG/100ML infusion  5-80 mcg/kg/min Intravenous Continuous WEarleen Newport MD 4.02 mL/hr at 07/02/18 1300 10 mcg/kg/min at 07/02/18 1300  . sodium bicarbonate 150 mEq in dextrose 5 % 1,000 mL infusion    Intravenous Continuous GTyler Pita MD   Stopped at 07/02/18 1259  . traZODone (DESYREL) tablet 25 mg  25 mg Oral QHS PRN Mansy, Jan A, MD      . vasopressin (PITRESSIN) 40 Units in sodium chloride 0.9 % 250 mL (0.16 Units/mL) infusion  0.03 Units/min Intravenous Continuous GTyler Pita MD 11.25 mL/hr at 07/02/18 1300 0.03 Units/min at 07/02/18 1300      Allergies: No Known Allergies    Past Medical History: Past Medical History:  Diagnosis Date  . HIV [place] virus infection) [place]   . Tuberculosis      Past Surgical History: Past Surgical History:  Procedure Laterality Date  . ABSCESS DRAINAGE  2013   chest wall; patient states it was not contiguous with his lung however this was an operation under general anesthesia  . FLEXIBLE BRONCHOSCOPY Right 03/28/2018   Procedure: FLEXIBLE BRONCHOSCOPY, RIGHT;  Surgeon: RLaverle Hobby MD;  Location: [facility] ORS;  Service: Pulmonary;  Laterality: Right;     Family History: History reviewed. No pertinent family history.   Social History: Social History   Socioeconomic History  . Marital status: Single    Spouse name: Not on file  . Number of children: Not on file  . Years of education: Not on file  . Highest education level: Not on file  Occupational History  . Not on file  Social Needs  . Financial resource strain: Not on file  . Food insecurity:    Worry: Not on file    Inability: Not on file  . Transportation needs:    Medical: Not on file    Non-medical: Not on file  Tobacco Use  . Smoking status: Current Every Day Smoker    Packs/day: 0.25  . Smokeless tobacco: Current User  Substance and Sexual Activity  . Alcohol use: Yes    Alcohol/week: 2.0 - 3.0 standard drinks    Types: 2 - 3 Standard drinks or equivalent per week    Frequency: Never  . Drug use: No  . Sexual activity: Not on file  Lifestyle  . Physical activity:    Days per week: Not on file    Minutes per session:  Not on file  . Stress: Not on file  Relationships  . Social connections:    Talks on phone: Not on file    Gets together: Not on file    Attends religious service: Not on file  Active member of club or organization: Not on file    Attends meetings of clubs or organizations: Not on file    Relationship status: Not on file  . Intimate partner violence:    Fear of current or ex partner: Not on file    Emotionally abused: Not on file    Physically abused: Not on file    Forced sexual activity: Not on file  Other Topics Concern  . Not on file  Social History Narrative  . Not on file     Review of Systems: Unable to obtain from the patient as he is currently intubated  Vital Signs: Blood pressure 113/78, pulse (!) 112, temperature 99.3 F (37.4 C), resp. rate (!) 30, height 6' (1.829 m), weight 67 kg, SpO2 100 %.  Weight trends: Filed Weights   07/01/18 0121 07/01/18 0930  Weight: 67 kg 67 kg    Physical Exam: General: Critically ill-appearing  Head: Severe temporal wasting  Eyes: Anicteric  Nose: Mucous membranes moist, not inflammed, nonerythematous.  Throat: Endotracheal tube noted to be in place  Neck: Supple, trachea midline.  Lungs:  Normal respiratory effort. Clear to auscultation BL without crackles or wheezes.  Heart: RRR. S1 and S2 normal without gallop, murmur, or rubs.  Abdomen:  BS normoactive. Soft, Nondistended, non-tender.  No masses or organomegaly.  Extremities: No pretibial edema.  Neurologic: Not following commands  Skin: No visible rashes, scars.    Lab results: Basic Metabolic Panel: Recent Labs  Lab 07/01/18 0431 07/01/18 1400 07/02/18 0517  NA 133* 134* 134*  K 3.4* 4.3 4.3  CL 103 105 100  CO2 16* 15* 18*  GLUCOSE 103* 101* 201*  BUN 94* 97* 103*  CREATININE 5.56* 5.98* 5.97*  CALCIUM 7.7* 7.4* 6.4*  MG  --   --  2.6*  PHOS  --   --  8.5*    Liver Function Tests: Recent Labs  Lab 07/01/18 0206  AST 83*  ALT 18  ALKPHOS  128*  BILITOT QUANTITY NOT SUFFICIENT, UNABLE TO PERFORM TEST  PROT 7.7  ALBUMIN QUANTITY NOT SUFFICIENT, UNABLE TO PERFORM TEST   No results for input(s): LIPASE, AMYLASE in the last 168 hours. No results for input(s): AMMONIA in the last 168 hours.  CBC: Recent Labs  Lab 07/01/18 0119 07/01/18 0624 07/01/18 1400 07/02/18 0517  WBC 6.4 2.4* 5.0 12.0*  NEUTROABS 5.2  --  4.2  --   HGB 15.6 13.0 13.2 12.3*  HCT 45.0 38.0* 39.7 35.0*  MCV 96.6 96.9 100.3* 95.1  PLT 270 256 236 152    Cardiac Enzymes: Recent Labs  Lab 07/01/18 0206 07/01/18 0431  CKTOTAL  --  220  TROPONINI QUANTITY NOT SUFFICIENT, UNABLE TO PERFORM TEST 0.05*    BNP: Invalid input(s): POCBNP  CBG: Recent Labs  Lab 07/01/18 1954 07/01/18 2343 07/02/18 0401 07/02/18 0823 07/02/18 1153  GLUCAP 114* 133* 154* 129* 76*    Microbiology: Results for orders placed or performed during the hospital encounter of 07/01/18  SARS Coronavirus 2 (CEPHEID- Performed in [place] hospital lab), Hosp Order     Status: None   Collection Time: 07/01/18  1:19 AM  Result Value Ref Range Status   SARS Coronavirus 2 NEGATIVE NEGATIVE Final    Comment: (NOTE) If result is NEGATIVE SARS-CoV-2 target nucleic acids are NOT DETECTED. The SARS-CoV-2 RNA is generally detectable in upper and lower  respiratory specimens during the acute phase of infection. The lowest  concentration of SARS-CoV-2 viral copies  this assay can detect is 250  copies / mL. A negative result does not preclude SARS-CoV-2 infection  and should not be used as the sole basis for treatment or other  patient management decisions.  A negative result may occur with  improper specimen collection / handling, submission of specimen other  than nasopharyngeal swab, presence of viral mutation(s) within the  areas targeted by this assay, and inadequate number of viral copies  (<250 copies / mL). A negative result must be combined with clinical   observations, patient history, and epidemiological information. If result is POSITIVE SARS-CoV-2 target nucleic acids are DETECTED. The SARS-CoV-2 RNA is generally detectable in upper and lower  respiratory specimens dur ing the acute phase of infection.  Positive  results are indicative of active infection with SARS-CoV-2.  Clinical  correlation with patient history and other diagnostic information is  necessary to determine patient infection status.  Positive results do  not rule out bacterial infection or co-infection with other viruses. If result is PRESUMPTIVE POSTIVE SARS-CoV-2 nucleic acids MAY BE PRESENT.   A presumptive positive result was obtained on the submitted specimen  and confirmed on repeat testing.  While 2019 novel coronavirus  (SARS-CoV-2) nucleic acids may be present in the submitted sample  additional confirmatory testing may be necessary for epidemiological  and / or clinical management purposes  to differentiate between  SARS-CoV-2 and other Sarbecovirus currently known to infect humans.  If clinically indicated additional testing with an alternate test  methodology [phone]) is advised. The SARS-CoV-2 RNA is generally  detectable in upper and lower respiratory sp ecimens during the acute  phase of infection. The expected result is Negative. Fact Sheet for Patients:  StrictlyIdeas.no Fact Sheet for Healthcare Providers: BankingDealers.co.za This test is not yet approved or cleared by the [place] FDA and has been authorized for detection and/or diagnosis of SARS-CoV-2 by FDA under an Emergency Use Authorization (EUA).  This EUA will remain in effect (meaning this test can be used) for the duration of the COVID-19 declaration under Section 564(b)(1) of the Act, 21 U.S.C. section 360bbb-3(b)(1), unless the authorization is terminated or revoked sooner. Performed at [facility], [address] [place], [place] [address]   Blood culture (routine x 2)     Status: None (Preliminary result)   Collection Time: 07/01/18  1:19 AM  Result Value Ref Range Status   Specimen Description   Final    BLOOD LEFT ANTECUBITAL Performed at [facility], [place] [address], [place], [place] [address]    Special Requests   Final    BOTTLES DRAWN AEROBIC AND ANAEROBIC Blood Culture results may not be optimal due to an excessive volume of blood received in culture bottles Performed at [facility], [address] [place], [place] [address]    Culture  Setup Time   Final    GRAM NEGATIVE RODS IN BOTH AEROBIC AND ANAEROBIC BOTTLES CRITICAL RESULT CALLED TO, READ BACK BY AND VERIFIED WITH: CHRISTINE KATSOUDAS AT 0865 07/01/18 SG Performed at [facility], [address] [place], [place] [address]    Culture   Final    Lonell Grandchild NEGATIVE RODS IDENTIFICATION AND SUSCEPTIBILITIES TO FOLLOW Performed at [facility], [place] [address], [place], [place] [address]    Report Status PENDING  Incomplete  Blood culture (routine x 2)     Status: Abnormal (Preliminary result)   Collection Time: 07/01/18  3:09 AM  Result Value Ref Range Status  Specimen Description   Final    BLOOD LEFT ARM Performed at [facility], [address], [place], [place] [address]    Special Requests   Final    BOTTLES DRAWN AEROBIC AND ANAEROBIC Blood Culture adequate volume Performed at [facility], [address] [place], [place] [address]    Culture  Setup Time   Final    GRAM POSITIVE COCCI IN BOTH AEROBIC AND ANAEROBIC BOTTLES GRAM NEGATIVE RODS IN BOTH AEROBIC AND ANAEROBIC BOTTLES CRITICAL RESULT CALLED TO, READ BACK BY AND VERIFIED WITH: CHRISTINE KATSOUDAS AT 3016 07/01/18 SG    Culture (A)  Final    ESCHERICHIA COLI SUSCEPTIBILITIES TO FOLLOW GRAM POSITIVE COCCI CULTURE REINCUBATED FOR BETTER GROWTH Performed at [facility], [place] [address], [place], [place]  [address]    Report Status PENDING  Incomplete  Blood Culture ID Panel (Reflexed)     Status: Abnormal   Collection Time: 07/01/18  3:09 AM  Result Value Ref Range Status   Enterococcus species NOT DETECTED NOT DETECTED Final   Listeria monocytogenes NOT DETECTED NOT DETECTED Final   Staphylococcus species DETECTED (A) NOT DETECTED Final    Comment: CRITICAL RESULT CALLED TO, READ BACK BY AND VERIFIED WITH: CHRISTINE KATSOUDAS AT 2355 07/01/18 SG    Staphylococcus aureus (BCID) DETECTED (A) NOT DETECTED Final    Comment: Methicillin (oxacillin) susceptible Staphylococcus aureus (MSSA). Preferred therapy is anti staphylococcal beta lactam antibiotic (Cefazolin or Nafcillin), unless clinically contraindicated. CRITICAL RESULT CALLED TO, READ BACK BY AND VERIFIED WITH: CHRISTINE KATSOUDAS AT 7322 07/01/18 SG    Methicillin resistance NOT DETECTED NOT DETECTED Final   Streptococcus species DETECTED (A) NOT DETECTED Final    Comment: CRITICAL RESULT CALLED TO, READ BACK BY AND VERIFIED WITH: CHRISTINE KATSOUDAS AT 1325 07/01/18 SG    Streptococcus agalactiae DETECTED (A) NOT DETECTED Final    Comment: CRITICAL RESULT CALLED TO, READ BACK BY AND VERIFIED WITH: CHRISTINE KATSOUDAS AT 0254 07/01/18 SG    Streptococcus pneumoniae NOT DETECTED NOT DETECTED Final   Streptococcus pyogenes NOT DETECTED NOT DETECTED Final   Acinetobacter baumannii NOT DETECTED NOT DETECTED Final   Enterobacteriaceae species DETECTED (A) NOT DETECTED Final    Comment: Enterobacteriaceae represent a large family of gram-negative bacteria, not a single organism. CRITICAL RESULT CALLED TO, READ BACK BY AND VERIFIED WITH: CHRISTINE KATSOUDAS AT 2706 07/01/18 SG    Enterobacter cloacae complex NOT DETECTED NOT DETECTED Final   Escherichia coli DETECTED (A) NOT DETECTED Final    Comment: CRITICAL RESULT CALLED TO, READ BACK BY AND VERIFIED WITH: CHRISTINE KATSOUDAS AT 2376 07/01/18 SG    Klebsiella oxytoca NOT DETECTED NOT  DETECTED Final   Klebsiella pneumoniae NOT DETECTED NOT DETECTED Final   Proteus species NOT DETECTED NOT DETECTED Final   Serratia marcescens NOT DETECTED NOT DETECTED Final   Carbapenem resistance NOT DETECTED NOT DETECTED Final   Haemophilus influenzae NOT DETECTED NOT DETECTED Final   Neisseria meningitidis NOT DETECTED NOT DETECTED Final   Pseudomonas aeruginosa NOT DETECTED NOT DETECTED Final   Candida albicans NOT DETECTED NOT DETECTED Final   Candida glabrata NOT DETECTED NOT DETECTED Final   Candida krusei NOT DETECTED NOT DETECTED Final   Candida parapsilosis NOT DETECTED NOT DETECTED Final   Candida tropicalis NOT DETECTED NOT DETECTED Final    Comment: Performed at [facility], [address], [place], [place] [address]  MRSA PCR Screening     Status: None   Collection Time: 07/01/18  9:53 AM  Result Value Ref Range Status   MRSA by PCR NEGATIVE NEGATIVE Final    Comment:        The GeneXpert MRSA Assay (FDA approved for NASAL specimens only), is one component of a comprehensive MRSA colonization surveillance program. It is not intended to diagnose MRSA infection nor to guide or monitor treatment for MRSA infections. Performed at [facility], [address] [place], [place] [address]     Coagulation Studies: No results for input(s): LABPROT, INR in the last 72 hours.  Urinalysis: Recent Labs    07/01/18 0759  COLORURINE AMBER*  LABSPEC 1.025  PHURINE 5.0  GLUCOSEU 50*  HGBUR MODERATE*  BILIRUBINUR NEGATIVE  KETONESUR NEGATIVE  PROTEINUR 100*  NITRITE NEGATIVE  LEUKOCYTESUR NEGATIVE      Imaging: Dg Chest Portable 1 View  Result Date: 07/01/2018 CLINICAL DATA:  Hypoxia EXAM: PORTABLE CHEST 1 VIEW COMPARISON:  July 01, 2018 study obtained earlier in the day FINDINGS: Note that a portion of the left base is not apparent on either this examination or the abdominal image obtained at this time. Endotracheal tube tip is 5.7 cm above the  carina. Nasogastric tube tip and side port extend below the diaphragm. No pneumothorax. Visualized left lung is clear. There is extensive airspace consolidation throughout much of the right lung including essentially all of the right middle and lower lobes as well as a portion of the right upper lobe. There is a right pleural effusion, stable. Heart size and pulmonary vascularity are normal. No adenopathy. No bone lesions. IMPRESSION: Tube positions as described without pneumothorax. Extensive airspace consolidation on the right with right pleural effusion, stable. Visualized left lung clear. Stable cardiac silhouette. Electronically Signed   By: Lowella Grip III M.D.   On: 07/01/2018 08:35   Dg Chest Portable 1 View  Result Date: 07/01/2018 CLINICAL DATA:  Shortness of breath EXAM: PORTABLE CHEST 1 VIEW COMPARISON:  04/16/2018 FINDINGS: There is a large airspace opacity involving much of the right lower lobe. There is likely a small parapneumonic effusion. There is a density projecting over the upper trachea which is felt to represent the patient's non-rebreather mass as opposed to a very high endotracheal tube. Involving the left lower lobe, however this is not well evaluated secondary to multiple cardiac leads. There is no definite pneumothorax. There is a linear density coursing across the patient's right mid chest which is felt to represent artifact as opposed to a pneumothorax. There is no acute osseous abnormality. IMPRESSION: 1. Large right lower lobe airspace opacity concerning for pneumonia. 2. Probable small right-sided parapneumonic effusion. 3. Additional small airspace opacity at the left lung base. Attention on follow-up examinations is recommended. Electronically Signed   By: Constance Holster M.D.   On: 07/01/2018 01:51   Dg Abd Portable 1 View  Result Date: 07/01/2018 CLINICAL DATA:  Orogastric tube placement EXAM: PORTABLE ABDOMEN - 1 VIEW COMPARISON:  None. FINDINGS: Orogastric tube  tip and side port are in the stomach. There is no bowel dilatation or air-fluid level to suggest bowel obstruction. No free air. There is a right pleural effusion with extensive consolidation throughout the visualized right lung. Visualized left lung clear. IMPRESSION: Orogastric tube tip and side port in stomach. No bowel obstruction or free air demonstrable. Extensive airspace consolidation with pleural effusion on the right. Electronically Signed   By: Lowella Grip III M.D.   On: 07/01/2018 08:36      Assessment & Plan: Pt is a 47  y.o. male with a PMHx of HIV currently not on treatment, tuberculosis, history of abscess drainage, who was admitted to [facility] on 07/01/2018 for evaluation of altered mental status and unresponsiveness.  Patient admitted with severe right lower lobe pneumonia, acute renal failure, and respiratory failure.  1.  Acute renal failure secondary to acute tubular necrosis.  Critical care has requested evaluation.  Patient oliguric and critically ill.  As such we will proceed with CRRT.  We will plan for a 4K bath with no ultrafiltration at this time.  2.  Septic shock/hypotension.  Continue multiple pressors to maintain a map of 65 or greater.  3.  Metabolic acidosis.  This should be treated with both bicarbonate drip as well as CRRT.  Continue to monitor serum bicarbonate levels.  4.  Acute respiratory failure secondary to right lower lobe pneumonia.  Patient on multiple antibiotics.  Also on ventilatory support.  Further treatment plan as per critical care.  5.  I would like to thank Dr. Patsey Berthold for the kind referral.

## 2018-07-02 NOTE — Progress Notes (Signed)
[facility] [place] [facility] NAME: Joshua Dougherty    MR#:  [id number]  DATE OF BIRTH:  08/29/1970  SUBJECTIVE: Seen at bedside, intubated, sedated, on high-dose pressors.  CHIEF COMPLAINT:   Chief Complaint  Patient presents with  . Altered Mental Status  . Respiratory Distress    REVIEW OF SYSTEMS:   Review of Systems  Unable to perform ROS: Intubated    DRUG ALLERGIES:  No Known Allergies  VITALS:  Blood pressure 113/78, pulse (!) 112, temperature 99.3 F (37.4 C), resp. rate (!) 30, height 6' (1.829 m), weight 67 kg, SpO2 100 %.  PHYSICAL EXAMINATION:  GENERAL:  48 y.o.-year-old patient lying in the bed critically ill.   EYES: Pupils equal, round, reactive to light . No scleral icterus.   HEENT: Head atraumatic, normocephalic. Oropharynx and nasopharynx clear.  Currently intubated. NECK:  Supple, no jugular venous distention. No thyroid enlargement, no tenderness.  LUNGS: Coarse breath sounds bilaterally.Marland Kitchen  CARDIOVASCULAR: S1, S2 normal. No murmurs, rubs, or gallops.  ABDOMEN: Soft, nontender, nondistended. Bowel sounds present. No organomegaly or mass.  EXTREMITIES: No pedal edema, cyanosis, or clubbing.  NEUROLOGIC: Intubated, sedated   pSYCHIATRIC sedated SKIN: No obvious rash, lesion, or ulcer.    LABORATORY PANEL:   CBC Recent Labs  Lab 07/02/18 0517  WBC 12.0*  HGB 12.3*  HCT 35.0*  PLT 152   ------------------------------------------------------------------------------------------------------------------  Chemistries  Recent Labs  Lab 07/01/18 0206  07/02/18 0517  NA 131*   < > 134*  K 3.8   < > 4.3  CL 102   < > 100  CO2 12*   < > 18*  GLUCOSE 88   < > 201*  BUN QUANTITY NOT SUFFICIENT, UNABLE TO PERFORM TEST   < > 103*  CREATININE QUANTITY NOT SUFFICIENT, UNABLE TO PERFORM TEST   < > 5.97*  CALCIUM 7.9*   < > 6.4*  MG  --   --  2.6*  AST 83*  --   --   ALT 18  --   --   ALKPHOS 128*  --   --    BILITOT QUANTITY NOT SUFFICIENT, UNABLE TO PERFORM TEST  --   --    < > = values in this interval not displayed.   ------------------------------------------------------------------------------------------------------------------  Cardiac Enzymes Recent Labs  Lab 07/01/18 0431  TROPONINI 0.05*   ------------------------------------------------------------------------------------------------------------------  RADIOLOGY:  Dg Chest Portable 1 View  Result Date: 07/01/2018 CLINICAL DATA:  Hypoxia EXAM: PORTABLE CHEST 1 VIEW COMPARISON:  July 01, 2018 study obtained earlier in the day FINDINGS: Note that a portion of the left base is not apparent on either this examination or the abdominal image obtained at this time. Endotracheal tube tip is 5.7 cm above the carina. Nasogastric tube tip and side port extend below the diaphragm. No pneumothorax. Visualized left lung is clear. There is extensive airspace consolidation throughout much of the right lung including essentially all of the right middle and lower lobes as well as a portion of the right upper lobe. There is a right pleural effusion, stable. Heart size and pulmonary vascularity are normal. No adenopathy. No bone lesions. IMPRESSION: Tube positions as described without pneumothorax. Extensive airspace consolidation on the right with right pleural effusion, stable. Visualized left lung clear. Stable cardiac silhouette. Electronically Signed   By: Lowella Grip III M.D.   On: 07/01/2018 08:35   Dg Chest Portable 1 View  Result Date: 07/01/2018 CLINICAL DATA:  Shortness  of breath EXAM: PORTABLE CHEST 1 VIEW COMPARISON:  04/16/2018 FINDINGS: There is a large airspace opacity involving much of the right lower lobe. There is likely a small parapneumonic effusion. There is a density projecting over the upper trachea which is felt to represent the patient's non-rebreather mass as opposed to a very high endotracheal tube. Involving the left lower  lobe, however this is not well evaluated secondary to multiple cardiac leads. There is no definite pneumothorax. There is a linear density coursing across the patient's right mid chest which is felt to represent artifact as opposed to a pneumothorax. There is no acute osseous abnormality. IMPRESSION: 1. Large right lower lobe airspace opacity concerning for pneumonia. 2. Probable small right-sided parapneumonic effusion. 3. Additional small airspace opacity at the left lung base. Attention on follow-up examinations is recommended. Electronically Signed   By: Constance Holster M.D.   On: 07/01/2018 01:51   Dg Abd Portable 1 View  Result Date: 07/01/2018 CLINICAL DATA:  Orogastric tube placement EXAM: PORTABLE ABDOMEN - 1 VIEW COMPARISON:  None. FINDINGS: Orogastric tube tip and side port are in the stomach. There is no bowel dilatation or air-fluid level to suggest bowel obstruction. No free air. There is a right pleural effusion with extensive consolidation throughout the visualized right lung. Visualized left lung clear. IMPRESSION: Orogastric tube tip and side port in stomach. No bowel obstruction or free air demonstrable. Extensive airspace consolidation with pleural effusion on the right. Electronically Signed   By: Lowella Grip III M.D.   On: 07/01/2018 08:36    EKG:   Orders placed or performed during the hospital encounter of 07/01/18  . ED EKG  . ED EKG  . EKG 12-Lead  . EKG 12-Lead    ASSESSMENT AND PLAN:   48 year old male with history of AIDS, latent TB admitted for acute respiratory failure and septic shock   #1 septic shock secondary to bilateral pneumonia, patient has polymicrobial bacteremia with staph aureus, E. coli, GBS source likely lung.,  Continue meropenem.,  IV pressors. 2.  Acute respiratory failure, patient is intubated,, sedated.  History of cavitary pneumonia, admitted in February that time post sputum AFB that  were sent, which were negative, patient also had  bronc following discharge which were negative for AFB. 3 .  Acute renal failure with ATN secondary to septic shock, ; renal function not improved yet.,  Continue bicarb drip #4. HIV, recent CD4 count 403, not on [place] as not engaged in the care as per ID documentation. Prognosis guarded, high risk for cardiac arrest.  Condition critical, ICU physician spoke to patient's mom, wife.  All the records are reviewed and case discussed with Care Management/Social Workerr. Management plans discussed with the patient, family and they are in agreement.  CODE STATUS: DNR  TOTAL TIME TAKING CARE OF THIS PATIENT: 38 minutes.   Patient is critical to plan for discharge disposition. Epifanio Lesches M.D on 07/02/2018 at 1:59 PM  Between 7am to 6pm - Pager - [phone]  After 6pm go to [email or web address] - password [email or web address] [facility]  Office  [phone]  CC: Primary care physician; Patient, No Pcp Per   Note: This dictation was prepared with Dragon dictation along with smaller phrase technology. Any transcriptional errors that result from this process are unintentional.

## 2018-07-02 NOTE — Progress Notes (Signed)
*  PRELIMINARY RESULTS* Echocardiogram 2D Echocardiogram has been performed.  Joshua Dougherty 07/02/2018, 9:52 AM

## 2018-07-02 NOTE — Progress Notes (Signed)
Date of Admission:  07/01/2018      Subjective: Intubated and sedated On 2 pressor, HCO# drip Started CRRT today  I/O 5804/35   Medications:  . chlorhexidine gluconate (MEDLINE KIT)  15 mL Mouth Rinse BID  . guaiFENesin  600 mg Oral BID  . heparin injection (subcutaneous)  5,000 Units Subcutaneous Q12H  . hydrocortisone sod succinate (SOLU-CORTEF) inj  100 mg Intravenous Q8H  . insulin aspart  0-9 Units Subcutaneous Q6H  . ipratropium-albuterol  3 mL Nebulization Q4H  . mouth rinse  15 mL Mouth Rinse 10 times per day  . pantoprazole (PROTONIX) IV  40 mg Intravenous Q24H    Objective: Vital signs in last 24 hours: Temp:  [97 F (36.1 C)-103.1 F (39.5 C)] 99.3 F (37.4 C) (06/10 1300) Pulse Rate:  [109-134] 112 (06/10 1300) Resp:  [28-33] 30 (06/10 1300) BP: (80-132)/(42-87) 113/78 (06/10 1300) SpO2:  [91 %-100 %] 100 % (06/10 1300) FiO2 (%):  [90 %-100 %] 90 % (06/10 1200)  PHYSICAL EXAM:  General: intubated and sedated Lungs: crepts rt,decreased air entry rt  Heart: s1s2 Abdomen: Soft, non-tender,not distended. Bowel sounds normal. No masses Extremities: atraumatic, no cyanosis. No edema. No clubbing Skin: No rashes or lesions. Or bruising l. Neurologic: sedated  Lab Results Recent Labs    07/01/18 1400 07/02/18 0517  WBC 5.0 12.0*  HGB 13.2 12.3*  HCT 39.7 35.0*  NA 134* 134*  K 4.3 4.3  CL 105 100  CO2 15* 18*  BUN 97* 103*  CREATININE 5.98* 5.97*   Liver Panel Recent Labs    07/01/18 0206  PROT 7.7  ALBUMIN QUANTITY NOT SUFFICIENT, UNABLE TO PERFORM TEST  AST 83*  ALT 18  ALKPHOS 128*  BILITOT QUANTITY NOT SUFFICIENT, UNABLE TO PERFORM TEST   Sedimentation Rat  Microbiology:  Studies/Results: Dg Chest Portable 1 View  Result Date: 07/01/2018 CLINICAL DATA:  Hypoxia EXAM: PORTABLE CHEST 1 VIEW COMPARISON:  July 01, 2018 study obtained earlier in the day FINDINGS: Note that a portion of the left base is not apparent on either this  examination or the abdominal image obtained at this time. Endotracheal tube tip is 5.7 cm above the carina. Nasogastric tube tip and side port extend below the diaphragm. No pneumothorax. Visualized left lung is clear. There is extensive airspace consolidation throughout much of the right lung including essentially all of the right middle and lower lobes as well as a portion of the right upper lobe. There is a right pleural effusion, stable. Heart size and pulmonary vascularity are normal. No adenopathy. No bone lesions. IMPRESSION: Tube positions as described without pneumothorax. Extensive airspace consolidation on the right with right pleural effusion, stable. Visualized left lung clear. Stable cardiac silhouette. Electronically Signed   By: Lowella Grip III M.D.   On: 07/01/2018 08:35   Dg Chest Portable 1 View  Result Date: 07/01/2018 CLINICAL DATA:  Shortness of breath EXAM: PORTABLE CHEST 1 VIEW COMPARISON:  04/16/2018 FINDINGS: There is a large airspace opacity involving much of the right lower lobe. There is likely a small parapneumonic effusion. There is a density projecting over the upper trachea which is felt to represent the patient's non-rebreather mass as opposed to a very high endotracheal tube. Involving the left lower lobe, however this is not well evaluated secondary to multiple cardiac leads. There is no definite pneumothorax. There is a linear density coursing across the patient's right mid chest which is felt to represent artifact as opposed to a pneumothorax.  There is no acute osseous abnormality. IMPRESSION: 1. Large right lower lobe airspace opacity concerning for pneumonia. 2. Probable small right-sided parapneumonic effusion. 3. Additional small airspace opacity at the left lung base. Attention on follow-up examinations is recommended. Electronically Signed   By: Constance Holster M.D.   On: 07/01/2018 01:51   Dg Abd Portable 1 View  Result Date: 07/01/2018 CLINICAL DATA:   Orogastric tube placement EXAM: PORTABLE ABDOMEN - 1 VIEW COMPARISON:  None. FINDINGS: Orogastric tube tip and side port are in the stomach. There is no bowel dilatation or air-fluid level to suggest bowel obstruction. No free air. There is a right pleural effusion with extensive consolidation throughout the visualized right lung. Visualized left lung clear. IMPRESSION: Orogastric tube tip and side port in stomach. No bowel obstruction or free air demonstrable. Extensive airspace consolidation with pleural effusion on the right. Electronically Signed   By: Lowella Grip III M.D.   On: 07/01/2018 08:36     Assessment/Plan: Polymicrobial bacteremia- staph aureus, E.coli and GBS bacteremia- ??source ? Lung Will need TEE Will need repeat culture Continue meorpenem until susceptibility of e.coli available  Acute hypoxic respiratory failure intubated  Rt lower lobe pneumonia in a patient with cavitary lesion- Investigated for TB in march with 3 sputum and one bronch specimen- all negative including NAAT  Acute hypoxic resp failure  Acute renal failure- Due to ATN from sepsis on CRRT ? HIV- cd4 is 403, VL 29K in Feb 2020- not on HAART as not engaged in care- will check labs   Discussed =with care team

## 2018-07-02 NOTE — Progress Notes (Signed)
CRRT initiated at 1735 hrs.

## 2018-07-02 NOTE — Progress Notes (Signed)
CRITICAL VALUE ALERT  Critical Value: 6.4 calcium  Date & Time Notied:  07/02/2018 6:10  Provider Notified: Tana Conch, NP  Orders Received/Actions taken: results acknowledged

## 2018-07-02 NOTE — Consult Note (Signed)
Pharmacy Antibiotic Note  Joshua Dougherty is a 48 y.o. male admitted on 07/01/2018 with respiratory distress and pneumonia.  Pharmacy has been consulted for meropenem dosing. He was originally started on vancomycin and cefepime for PNA but has since grown out multiple species in his blood cultures (see below). SCr this morning 5.97 up from 5.56. Last SCr PTA on 2/27 was 0.70. He has acute renal failure secondary to ATN and is scheduled to start CRRT  Patient had PMH of HIV and TB (noted to have been treated)  Plan: Change meropenem dose to 1000 mg IV every 8 hours  Height: 6' (182.9 cm) Weight: 147 lb 11.3 oz (67 kg) IBW/kg (Calculated) : 77.6  Temp (24hrs), Avg:99.4 F (37.4 C), Min:97 F (36.1 C), Max:103.1 F (39.5 C)  Recent Labs  Lab 07/01/18 0119 07/01/18 0206 07/01/18 0431 07/01/18 0624 07/01/18 1400 07/02/18 0517  WBC 6.4  --   --  2.4* 5.0 12.0*  CREATININE  --  QUANTITY NOT SUFFICIENT, UNABLE TO PERFORM TEST 5.56*  --  5.98* 5.97*  LATICACIDVEN  --  3.6*  --  3.0*  --   --     Estimated Creatinine Clearance: 14.3 mL/min (A) (by C-G formula based on SCr of 5.97 mg/dL (H)).    No Known Allergies  Antimicrobials this admission: Meropenem 6/9> Azithromycin 6/8>6/10 Cefepime 6/8>6/9 Vanc 6/8> 6/9  Microbiology results: 6/9 RCx pending 6/9 Bcx E coli, MSSA, grpB strep: sensitivities pending 6/9 MRSA PCR: pending  Thank you for allowing pharmacy to be a part of this patient's care.  Dallie Piles, PharmD Clinical Pharmacist 07/02/2018 9:32 AM

## 2018-07-02 NOTE — Procedures (Signed)
Hemodialysis Catheter Insertion Procedure Note Joshua Dougherty [id number] [birth year]  Procedure: Insertion of Hemodialysis Catheter Indications: Dialysis Access   Procedure Details Consent: Risks of procedure as well as the alternatives and risks of each were explained to the (patient/caregiver).  Consent for procedure obtained. Time Out: Verified patient identification, verified procedure, site/side was marked, verified correct patient position, special equipment/implants available, medications/allergies/relevent history reviewed, required imaging and test results available.  Performed  Maximum sterile technique was used including antiseptics, cap, gloves, gown, hand hygiene, mask and sheet. Skin prep: Chlorhexidine; local anesthetic administered Triple lumen hemodialysis catheter was inserted into left femoral vein due to multiple attempts, no other available access using the Seldinger technique.  Evaluation Blood flow good Complications: No apparent complications Patient yes tolerate procedure well. Chest X-ray ordered to verify placement.  CXR: not indicated.   Left femoral temporary trialysis catheter placed utilizing ultrasound no complications noted during or following procedure.  Joshua Dougherty, Joshua Dougherty Pager [phone] (please enter 7 digits) PCCM Consult Pager [phone] (please enter 7 digits)

## 2018-07-02 NOTE — Plan of Care (Signed)
  Problem: Activity: Goal: Ability to tolerate increased activity will improve Outcome: Not Progressing   Problem: Clinical Measurements: Goal: Ability to maintain a body temperature in the normal range will improve Outcome: Not Progressing   Problem: Respiratory: Goal: Ability to maintain adequate ventilation will improve Outcome: Not Progressing Goal: Ability to maintain a clear airway will improve Outcome: Not Progressing   Problem: Fluid Volume: Goal: Hemodynamic stability will improve Outcome: Not Progressing   Problem: Clinical Measurements: Goal: Diagnostic test results will improve Outcome: Not Progressing Goal: Signs and symptoms of infection will decrease Outcome: Not Progressing   Problem: Respiratory: Goal: Ability to maintain adequate ventilation will improve Outcome: Not Progressing

## 2018-07-03 ENCOUNTER — Inpatient Hospital Stay: Payer: Medicaid Other

## 2018-07-03 DIAGNOSIS — N179 Acute kidney failure, unspecified: Secondary | ICD-10-CM

## 2018-07-03 DIAGNOSIS — R6521 Severe sepsis with septic shock: Secondary | ICD-10-CM

## 2018-07-03 DIAGNOSIS — Z992 Dependence on renal dialysis: Secondary | ICD-10-CM

## 2018-07-03 LAB — RENAL FUNCTION PANEL
Albumin: 1.5 g/dL — ABNORMAL LOW (ref 3.5–5.0)
Albumin: 1.5 g/dL — ABNORMAL LOW (ref 3.5–5.0)
Albumin: 1.5 g/dL — ABNORMAL LOW (ref 3.5–5.0)
Albumin: 1.6 g/dL — ABNORMAL LOW (ref 3.5–5.0)
Albumin: 1.6 g/dL — ABNORMAL LOW (ref 3.5–5.0)
Albumin: 1.6 g/dL — ABNORMAL LOW (ref 3.5–5.0)
Anion gap: 11 (ref 5–15)
Anion gap: 14 (ref 5–15)
Anion gap: 7 (ref 5–15)
Anion gap: 9 (ref 5–15)
Anion gap: 9 (ref 5–15)
Anion gap: 9 (ref 5–15)
BUN: 50 mg/dL — ABNORMAL HIGH (ref 6–20)
BUN: 52 mg/dL — ABNORMAL HIGH (ref 6–20)
BUN: 56 mg/dL — ABNORMAL HIGH (ref 6–20)
BUN: 60 mg/dL — ABNORMAL HIGH (ref 6–20)
BUN: 67 mg/dL — ABNORMAL HIGH (ref 6–20)
BUN: 79 mg/dL — ABNORMAL HIGH (ref 6–20)
CO2: 23 mmol/L (ref 22–32)
CO2: 25 mmol/L (ref 22–32)
CO2: 25 mmol/L (ref 22–32)
CO2: 25 mmol/L (ref 22–32)
CO2: 26 mmol/L (ref 22–32)
CO2: 27 mmol/L (ref 22–32)
Calcium: 6.8 mg/dL — ABNORMAL LOW (ref 8.9–10.3)
Calcium: 7.2 mg/dL — ABNORMAL LOW (ref 8.9–10.3)
Calcium: 7.3 mg/dL — ABNORMAL LOW (ref 8.9–10.3)
Calcium: 7.4 mg/dL — ABNORMAL LOW (ref 8.9–10.3)
Calcium: 7.5 mg/dL — ABNORMAL LOW (ref 8.9–10.3)
Calcium: 7.5 mg/dL — ABNORMAL LOW (ref 8.9–10.3)
Chloride: 100 mmol/L (ref 98–111)
Chloride: 100 mmol/L (ref 98–111)
Chloride: 102 mmol/L (ref 98–111)
Chloride: 98 mmol/L (ref 98–111)
Chloride: 98 mmol/L (ref 98–111)
Chloride: 99 mmol/L (ref 98–111)
Creatinine, Ser: 2.56 mg/dL — ABNORMAL HIGH (ref 0.61–1.24)
Creatinine, Ser: 2.65 mg/dL — ABNORMAL HIGH (ref 0.61–1.24)
Creatinine, Ser: 2.86 mg/dL — ABNORMAL HIGH (ref 0.61–1.24)
Creatinine, Ser: 3.08 mg/dL — ABNORMAL HIGH (ref 0.61–1.24)
Creatinine, Ser: 3.48 mg/dL — ABNORMAL HIGH (ref 0.61–1.24)
Creatinine, Ser: 3.94 mg/dL — ABNORMAL HIGH (ref 0.61–1.24)
GFR calc Af Amer: 20 mL/min — ABNORMAL LOW (ref >60)
GFR calc Af Amer: 23 mL/min — ABNORMAL LOW (ref >60)
GFR calc Af Amer: 26 mL/min — ABNORMAL LOW (ref >60)
GFR calc Af Amer: 29 mL/min — ABNORMAL LOW (ref >60)
GFR calc Af Amer: 32 mL/min — ABNORMAL LOW (ref >60)
GFR calc Af Amer: 33 mL/min — ABNORMAL LOW (ref >60)
GFR calc non Af Amer: 17 mL/min — ABNORMAL LOW (ref >60)
GFR calc non Af Amer: 20 mL/min — ABNORMAL LOW (ref >60)
GFR calc non Af Amer: 23 mL/min — ABNORMAL LOW (ref >60)
GFR calc non Af Amer: 25 mL/min — ABNORMAL LOW (ref >60)
GFR calc non Af Amer: 27 mL/min — ABNORMAL LOW (ref >60)
GFR calc non Af Amer: 28 mL/min — ABNORMAL LOW (ref >60)
Glucose, Bld: 118 mg/dL — ABNORMAL HIGH (ref 70–99)
Glucose, Bld: 123 mg/dL — ABNORMAL HIGH (ref 70–99)
Glucose, Bld: 134 mg/dL — ABNORMAL HIGH (ref 70–99)
Glucose, Bld: 136 mg/dL — ABNORMAL HIGH (ref 70–99)
Glucose, Bld: 145 mg/dL — ABNORMAL HIGH (ref 70–99)
Glucose, Bld: 185 mg/dL — ABNORMAL HIGH (ref 70–99)
Phosphorus: 2.9 mg/dL (ref 2.5–4.6)
Phosphorus: 2.9 mg/dL (ref 2.5–4.6)
Phosphorus: 3.2 mg/dL (ref 2.5–4.6)
Phosphorus: 3.3 mg/dL (ref 2.5–4.6)
Phosphorus: 3.5 mg/dL (ref 2.5–4.6)
Phosphorus: 4.1 mg/dL (ref 2.5–4.6)
Potassium: 3.6 mmol/L (ref 3.5–5.1)
Potassium: 3.7 mmol/L (ref 3.5–5.1)
Potassium: 3.9 mmol/L (ref 3.5–5.1)
Potassium: 4 mmol/L (ref 3.5–5.1)
Potassium: 4 mmol/L (ref 3.5–5.1)
Potassium: 4.1 mmol/L (ref 3.5–5.1)
Sodium: 131 mmol/L — ABNORMAL LOW (ref 135–145)
Sodium: 134 mmol/L — ABNORMAL LOW (ref 135–145)
Sodium: 134 mmol/L — ABNORMAL LOW (ref 135–145)
Sodium: 135 mmol/L (ref 135–145)
Sodium: 136 mmol/L (ref 135–145)
Sodium: 137 mmol/L (ref 135–145)

## 2018-07-03 LAB — LACTIC ACID, PLASMA
Lactic Acid, Venous: 3.5 mmol/L (ref 0.5–1.9)
Lactic Acid, Venous: 3.1 mmol/L (ref 0.5–1.9)

## 2018-07-03 LAB — T-HELPER CELLS CD4/CD8 %
% CD 4 Pos. Lymph.: 8.9 % — ABNORMAL LOW (ref 30.8–58.5)
Absolute CD 4 Helper: 45 /uL — ABNORMAL LOW (ref 359–1519)
Basophils Absolute: 0 10*3/uL (ref 0.0–0.2)
Basos: 0 %
CD3+CD4+ Cells/CD3+CD8+ Cells Bld: 0.11 — ABNORMAL LOW (ref 0.92–3.72)
CD3+CD8+ Cells # Bld: 392 /uL (ref 109–897)
CD3+CD8+ Cells NFr Bld: 78.4 % — ABNORMAL HIGH (ref 12.0–35.5)
EOS (ABSOLUTE): 0 10*3/uL (ref 0.0–0.4)
Eos: 0 %
Hematocrit: 33.7 % — ABNORMAL LOW (ref 37.5–51.0)
Hemoglobin: 12.4 g/dL — ABNORMAL LOW (ref 13.0–17.7)
Lymphocytes Absolute: 0.5 10*3/uL — ABNORMAL LOW (ref 0.7–3.1)
Lymphs: 3 %
MCH: 33.9 pg — ABNORMAL HIGH (ref 26.6–33.0)
MCHC: 36.8 g/dL — ABNORMAL HIGH (ref 31.5–35.7)
MCV: 92 fL (ref 79–97)
Monocytes Absolute: 1.1 10*3/uL — ABNORMAL HIGH (ref 0.1–0.9)
Monocytes: 7 %
Neutrophils Absolute: 14.4 10*3/uL — ABNORMAL HIGH (ref 1.4–7.0)
Neutrophils: 90 %
Platelets: 114 10*3/uL — ABNORMAL LOW (ref 150–450)
RBC: 3.66 x10E6/uL — ABNORMAL LOW (ref 4.14–5.80)
RDW: 12.5 % (ref 11.6–15.4)
WBC: 16 10*3/uL — ABNORMAL HIGH (ref 3.4–10.8)

## 2018-07-03 LAB — BLOOD GAS, ARTERIAL
Acid-base deficit: 1.1 mmol/L (ref 0.0–2.0)
Bicarbonate: 25.3 mmol/L (ref 20.0–28.0)
FIO2: 0.6
MECHVT: 500 mL
O2 Saturation: 98.2 %
PEEP: 8 cmH2O
Patient temperature: 37
RATE: 30 resp/min
pCO2 arterial: 48 mmHg (ref 32.0–48.0)
pH, Arterial: 7.33 — ABNORMAL LOW (ref 7.350–7.450)
pO2, Arterial: 115 mmHg — ABNORMAL HIGH (ref 83.0–108.0)

## 2018-07-03 LAB — CULTURE, BLOOD (ROUTINE X 2)

## 2018-07-03 LAB — CBC WITH DIFFERENTIAL/PLATELET
Abs Immature Granulocytes: 4.42 10*3/uL — ABNORMAL HIGH (ref 0.00–0.07)
Basophils Absolute: 0 10*3/uL (ref 0.0–0.1)
Basophils Relative: 0 %
Eosinophils Absolute: 0 10*3/uL (ref 0.0–0.5)
Eosinophils Relative: 0 %
HCT: 31.3 % — ABNORMAL LOW (ref 39.0–52.0)
Hemoglobin: 11.4 g/dL — ABNORMAL LOW (ref 13.0–17.0)
Immature Granulocytes: 17 %
Lymphocytes Relative: 2 %
Lymphs Abs: 0.6 10*3/uL — ABNORMAL LOW (ref 0.7–4.0)
MCH: 33.6 pg (ref 26.0–34.0)
MCHC: 36.4 g/dL — ABNORMAL HIGH (ref 30.0–36.0)
MCV: 92.3 fL (ref 80.0–100.0)
Monocytes Absolute: 1.6 10*3/uL — ABNORMAL HIGH (ref 0.1–1.0)
Monocytes Relative: 6 %
Neutro Abs: 19.6 10*3/uL — ABNORMAL HIGH (ref 1.7–7.7)
Neutrophils Relative %: 75 %
Platelets: 113 10*3/uL — ABNORMAL LOW (ref 150–400)
RBC: 3.39 MIL/uL — ABNORMAL LOW (ref 4.22–5.81)
RDW: 14.6 % (ref 11.5–15.5)
Smear Review: NORMAL
WBC: 26.2 10*3/uL — ABNORMAL HIGH (ref 4.0–10.5)
nRBC: 0 % (ref 0.0–0.2)

## 2018-07-03 LAB — MAGNESIUM
Magnesium: 1.9 mg/dL (ref 1.7–2.4)
Magnesium: 2 mg/dL (ref 1.7–2.4)
Magnesium: 2 mg/dL (ref 1.7–2.4)
Magnesium: 2.1 mg/dL (ref 1.7–2.4)
Magnesium: 2.1 mg/dL (ref 1.7–2.4)
Magnesium: 2.2 mg/dL (ref 1.7–2.4)

## 2018-07-03 LAB — GLUCOSE, CAPILLARY
Glucose-Capillary: 106 mg/dL — ABNORMAL HIGH (ref 70–99)
Glucose-Capillary: 107 mg/dL — ABNORMAL HIGH (ref 70–99)
Glucose-Capillary: 119 mg/dL — ABNORMAL HIGH (ref 70–99)
Glucose-Capillary: 119 mg/dL — ABNORMAL HIGH (ref 70–99)
Glucose-Capillary: 130 mg/dL — ABNORMAL HIGH (ref 70–99)
Glucose-Capillary: 174 mg/dL — ABNORMAL HIGH (ref 70–99)
Glucose-Capillary: 71 mg/dL (ref 70–99)

## 2018-07-03 LAB — PROTEIN / CREATININE RATIO, URINE
Creatinine, Urine: 234 mg/dL
Protein Creatinine Ratio: 3.77 mg/mg{Cre} — ABNORMAL HIGH (ref 0.00–0.15)
Total Protein, Urine: 882 mg/dL

## 2018-07-03 MED ORDER — VITAL HIGH PROTEIN PO LIQD
1000.0000 mL | ORAL | Status: DC
  Administered 2018-07-03: 1000 mL

## 2018-07-03 MED ORDER — HYDROCORTISONE NA SUCCINATE PF 100 MG IJ SOLR
50.0000 mg | Freq: Four times a day (QID) | INTRAMUSCULAR | Status: DC
  Administered 2018-07-03 – 2018-07-07 (×16): 50 mg via INTRAVENOUS
  Filled 2018-07-03 (×16): qty 2

## 2018-07-03 MED ORDER — DEXTROSE 50 % IV SOLN
25.0000 mL | Freq: Once | INTRAVENOUS | Status: AC
  Administered 2018-07-03: 25 mL via INTRAVENOUS
  Filled 2018-07-03: qty 50

## 2018-07-03 MED ORDER — CEFAZOLIN SODIUM-DEXTROSE 1-4 GM/50ML-% IV SOLN
1.0000 g | Freq: Three times a day (TID) | INTRAVENOUS | Status: DC
  Administered 2018-07-03 – 2018-07-07 (×13): 1 g via INTRAVENOUS
  Filled 2018-07-03 (×16): qty 50

## 2018-07-03 MED ORDER — B COMPLEX-C PO TABS
1.0000 | ORAL_TABLET | Freq: Every day | ORAL | Status: DC
  Administered 2018-07-03: 1
  Filled 2018-07-03 (×6): qty 1

## 2018-07-03 NOTE — Progress Notes (Addendum)
CRRT stopped at 1128 d/t appearance of filter and rising pressures, rinseback successful. Lines packed w/heparin, new cartridge inserted and new pureflow bags spiked.   CRRT restarted at 1214

## 2018-07-03 NOTE — Progress Notes (Signed)
CRITICAL CARE NOTE  SYNOPSIS 48 yo male with a PMH of Tuberculosis (dx 02/2018 underwent bronchoscopy for sampling on 03/28/2018 by Dr. Ashby Dawes) and HIV (currently not on treatment).  He presented to [facility] ER on 06/9 via EMS with severe respiratory distress.   Per ER notes pts family called the sheriff's department to perform a wellness check because they had not heard from the pt in several days. The fire department found the pt confused and lying supine on the ground with c/o difficulty breathing.   -Pt placed on NRB en route to the ER.  Upon arrival to the ER pt transitioned to Bipap due to continued respiratory failure.  Lab results revealed Na+ 131, CO2 12, lactic acid 3.6, COVID-19 negative, and vbg pH 7.23/pCO2 35.  -CXR concerning for right lower lobe airspace opacity likely secondary to pneumonia and probable small right-sided parapneumonic effusion.   -received 2L NS bolus, vancomycin, cefepime, and clindamycin.  - intubation in the emergency room.  - transient arrest of less than a minute where he became pulseless and required epinephrine and atropine.  He has been pressor dependent since that episode.  Now requiring norepinephrine and vasopressin.   CC  follow up respiratory failure  SUBJECTIVE Patient remains critically ill Prognosis is guarded Multiorgan failure Multiple vasopressors Severe resp failure and septic shock  Vent Mode: PRVC FiO2 (%):  [60 %-90 %] 60 % Set Rate:  [30 bmp] 30 bmp Vt Set:  [500 mL] 500 mL PEEP:  [8 cmH20] 8 cmH20 Plateau Pressure:  [19 cmH20-22 cmH20] 19 cmH20   BP (!) 88/65   Pulse 97   Temp (!) 97.4 F (36.3 C) (Bladder)   Resp (!) 30   Ht 6' (1.829 m)   Wt 72.8 kg   SpO2 98%   BMI 21.77 kg/m    I/O last 3 completed shifts: In: 7992.5 [I.V.:6793.3; IV Piggyback:1199.2] Out: 95 [Urine:45; Emesis/NG output:50] No intake/output data recorded.  SpO2: 98 % FiO2 (%): 60 %   CBC    Component Value Date/Time   WBC 26.2 (H)  07/03/2018 0345   RBC 3.39 (L) 07/03/2018 0345   HGB 11.4 (L) 07/03/2018 0345   HGB 14.4 03/18/2018 1630   HCT 31.3 (L) 07/03/2018 0345   HCT 41.6 03/18/2018 1630   PLT 113 (L) 07/03/2018 0345   PLT 324 03/18/2018 1630   MCV 92.3 07/03/2018 0345   MCV 101 (H) 03/18/2018 1630   MCH 33.6 07/03/2018 0345   MCHC 36.4 (H) 07/03/2018 0345   RDW 14.6 07/03/2018 0345   RDW 12.0 03/18/2018 1630   LYMPHSABS 0.6 (L) 07/03/2018 0345   LYMPHSABS 2.2 03/18/2018 1630   MONOABS 1.6 (H) 07/03/2018 0345   EOSABS 0.0 07/03/2018 0345   EOSABS 0.0 03/18/2018 1630   BASOSABS 0.0 07/03/2018 0345   BASOSABS 0.0 03/18/2018 1630   BMP Latest Ref Rng & Units 07/03/2018 07/03/2018 07/02/2018  Glucose 70 - 99 mg/dL 145(H) 185(H) 218(H)  BUN 6 - 20 mg/dL 67(H) 79(H) 90(H)  Creatinine 0.61 - 1.24 mg/dL 3.48(H) 3.94(H) 4.70(H)  Sodium 135 - 145 mmol/L 136 135 135  Potassium 3.5 - 5.1 mmol/L 3.7 3.6 3.7  Chloride 98 - 111 mmol/L 100 98 98  CO2 22 - 32 mmol/L 25 23 23   Calcium 8.9 - 10.3 mg/dL 7.2(L) 6.8(L) 6.7(L)     SIGNIFICANT EVENTS 6/9 admitted for severe resp failure/SHock, E. COLI BACTEREMIA, GRAM +COCCI bacteremia 6/10 multiorgan failure, severe septic shock  REVIEW OF SYSTEMS  PATIENT IS  UNABLE TO PROVIDE COMPLETE REVIEW OF SYSTEMS DUE TO SEVERE CRITICAL ILLNESS   PHYSICAL EXAMINATION:  GENERAL:critically ill appearing, +resp distress HEAD: Normocephalic, atraumatic.  EYES: Pupils equal, round, reactive to light.  No scleral icterus.  MOUTH: Moist mucosal membrane. NECK: Supple. No thyromegaly. No nodules. No JVD.  PULMONARY: +rhonchi, +wheezing CARDIOVASCULAR: S1 and S2. Regular rate and rhythm. No murmurs, rubs, or gallops.  GASTROINTESTINAL: Soft, nontender, -distended. No masses. Positive bowel sounds. No hepatosplenomegaly.  MUSCULOSKELETAL: No swelling, clubbing, or edema.  NEUROLOGIC: obtunded, GCS<8 SKIN:intact,warm,dry  MEDICATIONS: I have reviewed all medications and confirmed  regimen as documented   CULTURE RESULTS   Recent Results (from the past 240 hour(s))  SARS Coronavirus 2 (CEPHEID- Performed in [facility] hospital lab), Hosp Order     Status: None   Collection Time: 07/01/18  1:19 AM   Specimen: Nasopharyngeal Swab  Result Value Ref Range Status   SARS Coronavirus 2 NEGATIVE NEGATIVE Final    Comment: (NOTE) If result is NEGATIVE SARS-CoV-2 target nucleic acids are NOT DETECTED. The SARS-CoV-2 RNA is generally detectable in upper and lower  respiratory specimens during the acute phase of infection. The lowest  concentration of SARS-CoV-2 viral copies this assay can detect is 250  copies / mL. A negative result does not preclude SARS-CoV-2 infection  and should not be used as the sole basis for treatment or other  patient management decisions.  A negative result may occur with  improper specimen collection / handling, submission of specimen other  than nasopharyngeal swab, presence of viral mutation(s) within the  areas targeted by this assay, and inadequate number of viral copies  (<250 copies / mL). A negative result must be combined with clinical  observations, patient history, and epidemiological information. If result is POSITIVE SARS-CoV-2 target nucleic acids are DETECTED. The SARS-CoV-2 RNA is generally detectable in upper and lower  respiratory specimens dur ing the acute phase of infection.  Positive  results are indicative of active infection with SARS-CoV-2.  Clinical  correlation with patient history and other diagnostic information is  necessary to determine patient infection status.  Positive results do  not rule out bacterial infection or co-infection with other viruses. If result is PRESUMPTIVE POSTIVE SARS-CoV-2 nucleic acids MAY BE PRESENT.   A presumptive positive result was obtained on the submitted specimen  and confirmed on repeat testing.  While 2019 novel coronavirus  (SARS-CoV-2) nucleic acids may be present in the  submitted sample  additional confirmatory testing may be necessary for epidemiological  and / or clinical management purposes  to differentiate between  SARS-CoV-2 and other Sarbecovirus currently known to infect humans.  If clinically indicated additional testing with an alternate test  methodology [id number]) is advised. The SARS-CoV-2 RNA is generally  detectable in upper and lower respiratory sp ecimens during the acute  phase of infection. The expected result is Negative. Fact Sheet for Patients:  StrictlyIdeas.no Fact Sheet for Healthcare Providers: BankingDealers.co.za This test is not yet approved or cleared by the [place] FDA and has been authorized for detection and/or diagnosis of SARS-CoV-2 by FDA under an Emergency Use Authorization (EUA).  This EUA will remain in effect (meaning this test can be used) for the duration of the COVID-19 declaration under Section 564(b)(1) of the Act, 21 U.S.C. section 360bbb-3(b)(1), unless the authorization is terminated or revoked sooner. Performed at [facility], [address] [place], [place] [address]   Blood culture (routine x 2)     Status: None (Preliminary  result)   Collection Time: 07/01/18  1:19 AM   Specimen: BLOOD  Result Value Ref Range Status   Specimen Description   Final    BLOOD LEFT ANTECUBITAL Performed at [facility], [place] [address] [place], [place] [address]    Special Requests   Final    BOTTLES DRAWN AEROBIC AND ANAEROBIC Blood Culture results may not be optimal due to an excessive volume of blood received in culture bottles Performed at [facility], [address] [place], [place] [address]    Culture  Setup Time   Final    GRAM NEGATIVE RODS IN BOTH AEROBIC AND ANAEROBIC BOTTLES CRITICAL RESULT CALLED TO, READ BACK BY AND VERIFIED WITH: CHRISTINE KATSOUDAS AT 4665 07/01/18 SG Performed at [facility], [address] [place], [place] [address]    Culture   Final    Lonell Grandchild NEGATIVE RODS IDENTIFICATION AND SUSCEPTIBILITIES TO FOLLOW Performed at [facility], [place] [address], [place], [place] [address]    Report Status PENDING  Incomplete  Blood culture (routine x 2)     Status: Abnormal (Preliminary result)   Collection Time: 07/01/18  3:09 AM   Specimen: BLOOD  Result Value Ref Range Status   Specimen Description   Final    BLOOD LEFT ARM Performed at [facility], [address], [place], [place] [address]    Special Requests   Final    BOTTLES DRAWN AEROBIC AND ANAEROBIC Blood Culture adequate volume Performed at [facility], [address] [place], [place] [address]    Culture  Setup Time   Final    GRAM POSITIVE COCCI IN BOTH AEROBIC AND ANAEROBIC BOTTLES GRAM NEGATIVE RODS IN BOTH AEROBIC AND ANAEROBIC BOTTLES CRITICAL RESULT CALLED TO, READ BACK BY AND VERIFIED WITH: CHRISTINE KATSOUDAS AT 0076 07/01/18 SG    Culture (A)  Final    ESCHERICHIA COLI SUSCEPTIBILITIES TO FOLLOW GRAM POSITIVE COCCI CULTURE REINCUBATED FOR BETTER GROWTH Performed at [facility], [place] [address], [place], [place] [address]    Report Status PENDING  Incomplete  Blood Culture ID Panel (Reflexed)     Status: Abnormal   Collection Time: 07/01/18  3:09 AM  Result Value Ref Range Status   Enterococcus species NOT DETECTED NOT DETECTED Final   Listeria monocytogenes NOT DETECTED NOT DETECTED Final   Staphylococcus species DETECTED (A) NOT DETECTED Final    Comment: CRITICAL RESULT CALLED TO, READ BACK BY AND VERIFIED WITH: CHRISTINE KATSOUDAS AT 3545 07/01/18 SG    Staphylococcus aureus (BCID) DETECTED (A) NOT DETECTED Final    Comment: Methicillin (oxacillin) susceptible Staphylococcus aureus (MSSA). Preferred therapy is anti staphylococcal beta lactam antibiotic (Cefazolin or Nafcillin), unless clinically contraindicated. CRITICAL RESULT CALLED TO, READ BACK BY AND VERIFIED  WITH: CHRISTINE KATSOUDAS AT 6256 07/01/18 SG    Methicillin resistance NOT DETECTED NOT DETECTED Final   Streptococcus species DETECTED (A) NOT DETECTED Final    Comment: CRITICAL RESULT CALLED TO, READ BACK BY AND VERIFIED WITH: CHRISTINE KATSOUDAS AT 1325 07/01/18 SG    Streptococcus agalactiae DETECTED (A) NOT DETECTED Final    Comment: CRITICAL RESULT CALLED TO, READ BACK BY AND VERIFIED WITH: CHRISTINE KATSOUDAS AT 3893 07/01/18 SG    Streptococcus pneumoniae NOT DETECTED NOT DETECTED Final   Streptococcus pyogenes NOT DETECTED NOT DETECTED Final   Acinetobacter baumannii NOT DETECTED NOT DETECTED Final   Enterobacteriaceae species DETECTED (A) NOT DETECTED Final    Comment: Enterobacteriaceae represent a large  family of gram-negative bacteria, not a single organism. CRITICAL RESULT CALLED TO, READ BACK BY AND VERIFIED WITH: CHRISTINE KATSOUDAS AT 9937 07/01/18 SG    Enterobacter cloacae complex NOT DETECTED NOT DETECTED Final   Escherichia coli DETECTED (A) NOT DETECTED Final    Comment: CRITICAL RESULT CALLED TO, READ BACK BY AND VERIFIED WITH: CHRISTINE KATSOUDAS AT 1696 07/01/18 SG    Klebsiella oxytoca NOT DETECTED NOT DETECTED Final   Klebsiella pneumoniae NOT DETECTED NOT DETECTED Final   Proteus species NOT DETECTED NOT DETECTED Final   Serratia marcescens NOT DETECTED NOT DETECTED Final   Carbapenem resistance NOT DETECTED NOT DETECTED Final   Haemophilus influenzae NOT DETECTED NOT DETECTED Final   Neisseria meningitidis NOT DETECTED NOT DETECTED Final   Pseudomonas aeruginosa NOT DETECTED NOT DETECTED Final   Candida albicans NOT DETECTED NOT DETECTED Final   Candida glabrata NOT DETECTED NOT DETECTED Final   Candida krusei NOT DETECTED NOT DETECTED Final   Candida parapsilosis NOT DETECTED NOT DETECTED Final   Candida tropicalis NOT DETECTED NOT DETECTED Final    Comment: Performed at [facility], [address] [place], [place] [address]  MRSA PCR Screening      Status: None   Collection Time: 07/01/18  9:53 AM   Specimen: Nasopharyngeal  Result Value Ref Range Status   MRSA by PCR NEGATIVE NEGATIVE Final    Comment:        The GeneXpert MRSA Assay (FDA approved for NASAL specimens only), is one component of a comprehensive MRSA colonization surveillance program. It is not intended to diagnose MRSA infection nor to guide or monitor treatment for MRSA infections. Performed at [facility], [address] [place], [place] [address]   Culture, respiratory (non-expectorated)     Status: None (Preliminary result)   Collection Time: 07/02/18  6:48 PM   Specimen: Tracheal Aspirate; Respiratory  Result Value Ref Range Status   Specimen Description   Final    TRACHEAL ASPIRATE Performed at [facility], [address], [place], [place] [address]    Special Requests   Final    NONE Performed at [facility], [address] [place], [place] [address]    Gram Stain   Final    RARE WBC PRESENT, PREDOMINANTLY PMN NO ORGANISMS SEEN Performed at [facility], [place] [address], [place],  [address]    Culture PENDING  Incomplete   Report Status PENDING  Incomplete  Culture, blood (routine x 2)     Status: None (Preliminary result)   Collection Time: 07/03/18 12:51 AM   Specimen: BLOOD  Result Value Ref Range Status   Specimen Description BLOOD RIGHT HAND  Final   Special Requests   Final    BOTTLES DRAWN AEROBIC AND ANAEROBIC Blood Culture adequate volume   Culture   Final    NO GROWTH < 12 HOURS Performed at [facility], [address], [place],  [address]    Report Status PENDING  Incomplete  Culture, blood (routine x 2)     Status: None (Preliminary result)   Collection Time: 07/03/18 12:58 AM   Specimen: BLOOD  Result Value Ref Range Status   Specimen Description BLOOD LEFT HAND  Final   Special Requests   Final    BOTTLES DRAWN AEROBIC AND ANAEROBIC Blood Culture  adequate volume   Culture   Final    NO GROWTH < 12 HOURS Performed at [facility], [place], [place]  [address]    Report Status PENDING  Incomplete            Indwelling Urinary Catheter continued, requirement due to   Reason to continue Indwelling Urinary Catheter strict Intake/Output monitoring for hemodynamic instability   Central Line/ continued, requirement due to  Reason to continue [place] of central venous pressure or other hemodynamic parameters and poor IV access   Ventilator continued, requirement due to severe respiratory failure   Ventilator Sedation RASS 0 to -2      ASSESSMENT AND PLAN SYNOPSIS   Severe ACUTE Hypoxic and Hypercapnic Respiratory Failure -continue Full MV support -continue Bronchodilator Therapy -Wean Fio2 and PEEP as tolerated -will perform SAT/SBT when respiratory parameters are met   ACUTE KIDNEY INJURY/Renal Failure -follow chem 7 -follow UO -continue Foley Catheter-assess need -Avoid nephrotoxic agents -Recheck creatinine  Requiring CRRT   NEUROLOGY - intubated and sedated - minimal sedation to achieve a RASS goal: -1 Wake up assessment pending   SHOCK-SEPSIS/HYPOVOLUMIC -use vasopressors to keep MAP>65 -follow ABG and LA -follow up cultures -emperic ABX -consider stress dose steroids -aggressive IV fluid resuscitation  CARDIAC ICU monitoring  ID -continue IV abx as prescibed -follow up cultures  GI GI PROPHYLAXIS as indicated  NUTRITIONAL STATUS DIET-->NPO Constipation protocol as indicated  ENDO - will use ICU hypoglycemic\Hyperglycemia protocol if indicated   ELECTROLYTES -follow labs as needed -replace as needed -pharmacy consultation and following   DVT/GI PRX ordered TRANSFUSIONS AS NEEDED MONITOR FSBS ASSESS the need for LABS as needed   Critical Care Time devoted to patient care services described in this note is 35 minutes.   Overall,  patient is critically ill, prognosis is guarded.  Patient with Multiorgan failure and at high risk for cardiac arrest and death.    Corrin Parker, M.D.  [facility]  Medical Director [place] Director [facility] Cardio-Pulmonary Department

## 2018-07-03 NOTE — Progress Notes (Signed)
[facility]  ROUNDING NOTE   Subjective:  Patient was initiated on CRRT yesterday. Thus far tolerating well. Remains critically ill however. Still on the ventilator.    Objective:  Vital signs in last 24 hours:  Temp:  [96.8 F (36 C)-98.7 F (37.1 C)] 98.7 F (37.1 C) (06/11 1200) Pulse Rate:  [97-108] 107 (06/11 1300) Resp:  [25-31] 30 (06/11 1300) BP: (88-144)/(64-89) 108/76 (06/11 1300) SpO2:  [96 %-100 %] 99 % (06/11 1330) FiO2 (%):  [50 %-60 %] 50 % (06/11 1330) Weight:  [72.8 kg] 72.8 kg (06/11 0442)  Weight change: 5.8 kg Filed Weights   07/01/18 0121 07/01/18 0930 07/03/18 0442  Weight: 67 kg 67 kg 72.8 kg    Intake/Output: I/O last 3 completed shifts: In: 8090.1 [I.V.:6890.9; IV Piggyback:1199.2] Out: 95 [Urine:45; Emesis/NG output:50]   Intake/Output this shift:  Total I/O In: 533 [I.V.:442.8; NG/GT:69.3; IV Piggyback:20.9] Out: 13 [Urine:13]  Physical Exam: General: Critically ill-appearing  Head: Severe temporal wasting  Eyes: Anicteric  Neck: Supple, trachea midline  Lungs:  Basilar rales, vent assisted  Heart: S1S2 no rubs  Abdomen:  Soft, nontender, bowel sounds present  Extremities: no peripheral edema.  Neurologic: sedated  Skin: No lesions  Access:  Left femoral temporary dialysis catheter    Basic Metabolic Panel: Recent Labs  Lab 07/02/18 1635 07/02/18 2011 07/03/18 0005 07/03/18 0345 07/03/18 0800 07/03/18 1229  NA 134* 135 135 136 134* 134*  K 3.8 3.7 3.6 3.7 4.0 3.9  CL 95* 98 98 100 98 100  CO2 20* 23 23 25 27 25   GLUCOSE 263* 218* 185* 145* 134* 136*  BUN 106* 90* 79* 67* 60* 56*  CREATININE 5.75* 4.70* 3.94* 3.48* 3.08* 2.86*  CALCIUM 6.4* 6.7* 6.8* 7.2* 7.4* 7.3*  MG 2.4 2.1 2.2 2.1 2.1  --   PHOS 7.2* 5.3* 4.1 3.5 3.3 3.2    Liver Function Tests: Recent Labs  Lab 07/01/18 0206  07/02/18 2011 07/03/18 0005 07/03/18 0345 07/03/18 0800 07/03/18 1229  AST 83*  --   --   --   --   --   --   ALT  18  --   --   --   --   --   --   ALKPHOS 128*  --   --   --   --   --   --   BILITOT QUANTITY NOT SUFFICIENT, UNABLE TO PERFORM TEST  --   --   --   --   --   --   PROT 7.7  --   --   --   --   --   --   ALBUMIN QUANTITY NOT SUFFICIENT, UNABLE TO PERFORM TEST   < > 1.5* 1.6* 1.5* 1.5* 1.5*   < > = values in this interval not displayed.   No results for input(s): LIPASE, AMYLASE in the last 168 hours. No results for input(s): AMMONIA in the last 168 hours.  CBC: Recent Labs  Lab 07/01/18 0119 07/01/18 0624 07/01/18 1400 07/02/18 0517 07/02/18 1151 07/03/18 0345  WBC 6.4 2.4* 5.0 12.0* 16.0* 26.2*  NEUTROABS 5.2  --  4.2  --  14.4* 19.6*  HGB 15.6 13.0 13.2 12.3* 12.4* 11.4*  HCT 45.0 38.0* 39.7 35.0* 33.7* 31.3*  MCV 96.6 96.9 100.3* 95.1 92 92.3  PLT 270 256 236 152 114* 113*    Cardiac Enzymes: Recent Labs  Lab 07/01/18 0206 07/01/18 0431  CKTOTAL  --  [place]  QUANTITY NOT SUFFICIENT, UNABLE TO PERFORM TEST 0.05*    BNP: Invalid input(s): POCBNP  CBG: Recent Labs  Lab 07/02/18 2011 07/03/18 0009 07/03/18 0345 07/03/18 0752 07/03/18 1225  GLUCAP 127* 174* 119* 130* 119*    Microbiology: Results for orders placed or performed during the hospital encounter of 07/01/18  SARS Coronavirus 2 (CEPHEID- Performed in [place] hospital lab), Hosp Order     Status: None   Collection Time: 07/01/18  1:19 AM   Specimen: Nasopharyngeal Swab  Result Value Ref Range Status   SARS Coronavirus 2 NEGATIVE NEGATIVE Final    Comment: (NOTE) If result is NEGATIVE SARS-CoV-2 target nucleic acids are NOT DETECTED. The SARS-CoV-2 RNA is generally detectable in upper and lower  respiratory specimens during the acute phase of infection. The lowest  concentration of SARS-CoV-2 viral copies this assay can detect is 250  copies / mL. A negative result does not preclude SARS-CoV-2 infection  and should not be used as the sole basis for treatment or other  patient  management decisions.  A negative result may occur with  improper specimen collection / handling, submission of specimen other  than nasopharyngeal swab, presence of viral mutation(s) within the  areas targeted by this assay, and inadequate number of viral copies  (<250 copies / mL). A negative result must be combined with clinical  observations, patient history, and epidemiological information. If result is POSITIVE SARS-CoV-2 target nucleic acids are DETECTED. The SARS-CoV-2 RNA is generally detectable in upper and lower  respiratory specimens dur ing the acute phase of infection.  Positive  results are indicative of active infection with SARS-CoV-2.  Clinical  correlation with patient history and other diagnostic information is  necessary to determine patient infection status.  Positive results do  not rule out bacterial infection or co-infection with other viruses. If result is PRESUMPTIVE POSTIVE SARS-CoV-2 nucleic acids MAY BE PRESENT.   A presumptive positive result was obtained on the submitted specimen  and confirmed on repeat testing.  While 2019 novel coronavirus  (SARS-CoV-2) nucleic acids may be present in the submitted sample  additional confirmatory testing may be necessary for epidemiological  and / or clinical management purposes  to differentiate between  SARS-CoV-2 and other Sarbecovirus currently known to infect humans.  If clinically indicated additional testing with an alternate test  methodology [phone]) is advised. The SARS-CoV-2 RNA is generally  detectable in upper and lower respiratory sp ecimens during the acute  phase of infection. The expected result is Negative. Fact Sheet for Patients:  StrictlyIdeas.no Fact Sheet for Healthcare Providers: BankingDealers.co.za This test is not yet approved or cleared by the [place] FDA and has been authorized for detection and/or diagnosis of SARS-CoV-2 by FDA under  an Emergency Use Authorization (EUA).  This EUA will remain in effect (meaning this test can be used) for the duration of the COVID-19 declaration under Section 564(b)(1) of the Act, 21 U.S.C. section 360bbb-3(b)(1), unless the authorization is terminated or revoked sooner. Performed at [facility], [address] [place], [place] [address]   Blood culture (routine x 2)     Status: Abnormal   Collection Time: 07/01/18  1:19 AM   Specimen: BLOOD  Result Value Ref Range Status   Specimen Description   Final    BLOOD LEFT ANTECUBITAL Performed at [facility], [place] [address], [place], [place] [address]    Special Requests   Final    BOTTLES DRAWN AEROBIC AND ANAEROBIC Blood Culture results  may not be optimal due to an excessive volume of blood received in culture bottles Performed at [facility], [address] [place], [place] [address]    Culture  Setup Time   Final    GRAM NEGATIVE RODS IN BOTH AEROBIC AND ANAEROBIC BOTTLES CRITICAL RESULT CALLED TO, READ BACK BY AND VERIFIED WITH: CHRISTINE KATSOUDAS AT 3086 07/01/18 SG Performed at [facility], [address] [place], [place] [address]    Culture ESCHERICHIA COLI (A)  Final   Report Status 07/03/2018 FINAL  Final   Organism ID, Bacteria ESCHERICHIA COLI  Final      Susceptibility   Escherichia coli - MIC*    AMPICILLIN 4 SENSITIVE Sensitive     CEFAZOLIN <=4 SENSITIVE Sensitive     CEFEPIME <=1 SENSITIVE Sensitive     CEFTAZIDIME <=1 SENSITIVE Sensitive     CEFTRIAXONE <=1 SENSITIVE Sensitive     CIPROFLOXACIN <=0.25 SENSITIVE Sensitive     GENTAMICIN <=1 SENSITIVE Sensitive     IMIPENEM <=0.25 SENSITIVE Sensitive     TRIMETH/SULFA <=20 SENSITIVE Sensitive     AMPICILLIN/SULBACTAM <=2 SENSITIVE Sensitive     PIP/TAZO <=4 SENSITIVE Sensitive     Extended ESBL NEGATIVE Sensitive     * ESCHERICHIA COLI  Blood culture (routine x 2)     Status: Abnormal (Preliminary result)    Collection Time: 07/01/18  3:09 AM   Specimen: BLOOD  Result Value Ref Range Status   Specimen Description   Final    BLOOD LEFT ARM Performed at [facility], [address], [place], [place] [address]    Special Requests   Final    BOTTLES DRAWN AEROBIC AND ANAEROBIC Blood Culture adequate volume Performed at [facility], [address] [place], [place] [address]    Culture  Setup Time   Final    GRAM POSITIVE COCCI IN BOTH AEROBIC AND ANAEROBIC BOTTLES GRAM NEGATIVE RODS IN BOTH AEROBIC AND ANAEROBIC BOTTLES CRITICAL RESULT CALLED TO, READ BACK BY AND VERIFIED WITH: CHRISTINE KATSOUDAS AT 2440 07/01/18 SG    Culture (A)  Final    ESCHERICHIA COLI ORGANISM 1 SUSCEPTIBILITIES PERFORMED ON PREVIOUS CULTURE WITHIN THE LAST 5 DAYS. STAPHYLOCOCCUS AUREUS GRAM POSITIVE COCCI IDENTIFICATION TO FOLLOW GROUP B STREP(S.AGALACTIAE)ISOLATED SUSCEPTIBILITIES TO FOLLOW Performed at [facility], [place] [address], [place], [place] [address]    Report Status PENDING  Incomplete  Blood Culture ID Panel (Reflexed)     Status: Abnormal   Collection Time: 07/01/18  3:09 AM  Result Value Ref Range Status   Enterococcus species NOT DETECTED NOT DETECTED Final   Listeria monocytogenes NOT DETECTED NOT DETECTED Final   Staphylococcus species DETECTED (A) NOT DETECTED Final    Comment: CRITICAL RESULT CALLED TO, READ BACK BY AND VERIFIED WITH: CHRISTINE KATSOUDAS AT 5366 07/01/18 SG    Staphylococcus aureus (BCID) DETECTED (A) NOT DETECTED Final    Comment: Methicillin (oxacillin) susceptible Staphylococcus aureus (MSSA). Preferred therapy is anti staphylococcal beta lactam antibiotic (Cefazolin or Nafcillin), unless clinically contraindicated. CRITICAL RESULT CALLED TO, READ BACK BY AND VERIFIED WITH: CHRISTINE KATSOUDAS AT 4403 07/01/18 SG    Methicillin resistance NOT DETECTED NOT DETECTED Final   Streptococcus species DETECTED (A) NOT DETECTED Final    Comment: CRITICAL  RESULT CALLED TO, READ BACK BY AND VERIFIED WITH: CHRISTINE KATSOUDAS AT 4742 07/01/18 SG    Streptococcus agalactiae DETECTED (A) NOT DETECTED Final    Comment: CRITICAL RESULT CALLED TO, READ BACK BY AND VERIFIED  WITH: CHRISTINE KATSOUDAS AT 0354 07/01/18 SG    Streptococcus pneumoniae NOT DETECTED NOT DETECTED Final   Streptococcus pyogenes NOT DETECTED NOT DETECTED Final   Acinetobacter baumannii NOT DETECTED NOT DETECTED Final   Enterobacteriaceae species DETECTED (A) NOT DETECTED Final    Comment: Enterobacteriaceae represent a large family of gram-negative bacteria, not a single organism. CRITICAL RESULT CALLED TO, READ BACK BY AND VERIFIED WITH: CHRISTINE KATSOUDAS AT 6568 07/01/18 SG    Enterobacter cloacae complex NOT DETECTED NOT DETECTED Final   Escherichia coli DETECTED (A) NOT DETECTED Final    Comment: CRITICAL RESULT CALLED TO, READ BACK BY AND VERIFIED WITH: CHRISTINE KATSOUDAS AT 1275 07/01/18 SG    Klebsiella oxytoca NOT DETECTED NOT DETECTED Final   Klebsiella pneumoniae NOT DETECTED NOT DETECTED Final   Proteus species NOT DETECTED NOT DETECTED Final   Serratia marcescens NOT DETECTED NOT DETECTED Final   Carbapenem resistance NOT DETECTED NOT DETECTED Final   Haemophilus influenzae NOT DETECTED NOT DETECTED Final   Neisseria meningitidis NOT DETECTED NOT DETECTED Final   Pseudomonas aeruginosa NOT DETECTED NOT DETECTED Final   Candida albicans NOT DETECTED NOT DETECTED Final   Candida glabrata NOT DETECTED NOT DETECTED Final   Candida krusei NOT DETECTED NOT DETECTED Final   Candida parapsilosis NOT DETECTED NOT DETECTED Final   Candida tropicalis NOT DETECTED NOT DETECTED Final    Comment: Performed at [facility], [address] [place], [place] [address]  MRSA PCR Screening     Status: None   Collection Time: 07/01/18  9:53 AM   Specimen: Nasopharyngeal  Result Value Ref Range Status   MRSA by PCR NEGATIVE NEGATIVE Final    Comment:        The  GeneXpert MRSA Assay (FDA approved for NASAL specimens only), is one component of a comprehensive MRSA colonization surveillance program. It is not intended to diagnose MRSA infection nor to guide or monitor treatment for MRSA infections. Performed at [facility], [address] [place], [place] [address]   Culture, respiratory (non-expectorated)     Status: None (Preliminary result)   Collection Time: 07/02/18  6:48 PM   Specimen: Tracheal Aspirate; Respiratory  Result Value Ref Range Status   Specimen Description   Final    TRACHEAL ASPIRATE Performed at [facility], [address] [place], [place] [address]    Special Requests   Final    NONE Performed at [facility], [address] [place], [place] [address]    Gram Stain   Final    RARE WBC PRESENT, PREDOMINANTLY PMN NO ORGANISMS SEEN    Culture   Final    NO GROWTH < 12 HOURS Performed at [place] [address] [place], [place] [address]    Report Status PENDING  Incomplete  Culture, blood (routine x 2)     Status: None (Preliminary result)   Collection Time: 07/03/18 12:51 AM   Specimen: BLOOD  Result Value Ref Range Status   Specimen Description BLOOD RIGHT HAND  Final   Special Requests   Final    BOTTLES DRAWN AEROBIC AND ANAEROBIC Blood Culture adequate volume   Culture   Final    NO GROWTH < 12 HOURS Performed at [facility], [address] [place], [place] [address]    Report Status PENDING  Incomplete  Culture, blood (routine x 2)     Status: None (Preliminary result)   Collection Time: 07/03/18 12:58 AM   Specimen:  BLOOD  Result Value Ref Range Status   Specimen Description BLOOD LEFT HAND  Final   Special Requests   Final    BOTTLES DRAWN AEROBIC AND ANAEROBIC Blood Culture adequate volume   Culture   Final    NO GROWTH < 12 HOURS Performed at [facility], [address] [place], [place] [address]    Report Status PENDING   Incomplete    Coagulation Studies: No results for input(s): LABPROT, INR in the last 72 hours.  Urinalysis: Recent Labs    07/01/18 0759  COLORURINE AMBER*  LABSPEC 1.025  PHURINE 5.0  GLUCOSEU 50*  HGBUR MODERATE*  BILIRUBINUR NEGATIVE  KETONESUR NEGATIVE  PROTEINUR 100*  NITRITE NEGATIVE  LEUKOCYTESUR NEGATIVE      Imaging: Dg Chest Port 1 View  Result Date: 07/03/2018 CLINICAL DATA:  Respiratory failure EXAM: PORTABLE CHEST 1 VIEW COMPARISON:  Portable exam 0434 hours compared to 07/01/2018 FINDINGS: Nasogastric tube extends into stomach. Tip of endotracheal tube projects 6.1 cm above carina. External pacing leads in EKG leads project over chest. Normal heart size, mediastinal contours, and pulmonary vascularity. Persistent consolidation RIGHT lower lobe. Remaining lungs grossly clear. No definite pleural effusion or pneumothorax. IMPRESSION: Persistent consolidation RIGHT lower lobe. Electronically Signed   By: Lavonia Dana M.D.   On: 07/03/2018 08:07     Medications:   . sodium chloride 5 mL/hr at 07/03/18 1200  .  ceFAZolin (ANCEF) IV    . fentaNYL infusion INTRAVENOUS 50 mcg/hr (07/03/18 1200)  . norepinephrine (LEVOPHED) Adult infusion 15 mcg/min (07/03/18 1200)  . propofol (DIPRIVAN) infusion 10 mcg/kg/min (07/03/18 1200)  . pureflow 2,500 mL/hr at 07/03/18 1202  . vasopressin (PITRESSIN) infusion - *FOR SHOCK* 0.03 Units/min (07/03/18 1200)   . B-complex with vitamin C  1 tablet Per Tube QHS  . chlorhexidine gluconate (MEDLINE KIT)  15 mL Mouth Rinse BID  . feeding supplement (VITAL HIGH PROTEIN)  1,000 mL Per Tube Q24H  . guaiFENesin  600 mg Oral BID  . heparin injection (subcutaneous)  5,000 Units Subcutaneous Q12H  . hydrocortisone sod succinate (SOLU-CORTEF) inj  50 mg Intravenous Q6H  . insulin aspart  0-15 Units Subcutaneous Q4H  . ipratropium-albuterol  3 mL Nebulization Q4H  . mouth rinse  15 mL Mouth Rinse 10 times per day  . pantoprazole (PROTONIX)  IV  40 mg Intravenous Q24H   sodium chloride, acetaminophen **OR** acetaminophen, heparin, ondansetron **OR** ondansetron (ZOFRAN) IV, traZODone  Assessment/ Plan:  48 y.o. male with a PMHx of HIV currently not on treatment, tuberculosis, history of abscess drainage, who was admitted to [facility] on 07/01/2018 for evaluation of altered mental status and unresponsiveness.  Patient admitted with severe right lower lobe pneumonia, acute renal failure, and respiratory failure.  1.  Acute renal failure secondary to acute tubular necrosis.    Patient remains critically ill.  Urine output was only 35 cc over the preceding 24 hours.  Therefore we will maintain the patient on CRRT at this time.  Continue to monitor serum electrolytes.  2.  Septic shock/hypotension.    Maintain the patient on norepinephrine as well as vasopressin to maintain map of 65 or greater.  3.  Metabolic acidosis.    Improved significantly with CRRT and bicarbonate drip.  4.  Acute respiratory failure secondary to right lower lobe pneumonia.  Continue ventilatory support at this time.   LOS: 2 Joshua Dougherty 6/11/20203:03 PM

## 2018-07-03 NOTE — Progress Notes (Signed)
[place] [place] NAME: Joshua Dougherty    MR#:  [id number]  DATE OF BIRTH:  06/17/1970  SUBJECTIVE: Seen at bedside, intubated, sedated, on high-dose pressors.  Still critically ill.  CHIEF COMPLAINT:   Chief Complaint  Patient presents with  . Altered Mental Status  . Respiratory Distress    REVIEW OF SYSTEMS:   Review of Systems  Unable to perform ROS: Intubated    DRUG ALLERGIES:  No Known Allergies  VITALS:  Blood pressure 108/83, pulse (!) 105, temperature 98 F (36.7 C), temperature source Bladder, resp. rate (!) 30, height 6' (1.829 m), weight 72.8 kg, SpO2 98 %.  PHYSICAL EXAMINATION:  GENERAL:  48 y.o.-year-old patient lying in the bed critically ill.   EYES: Pupils equal, round, reactive to light . No scleral icterus.   HEENT: Head atraumatic, normocephalic. Oropharynx and nasopharynx clear.  Currently intubated. NECK:  Supple, no jugular venous distention. No thyroid enlargement, no tenderness.  LUNGS: Coarse breath sounds bilaterally.Marland Kitchen  CARDIOVASCULAR: S1, S2 normal. No murmurs, rubs, or gallops.  ABDOMEN: Soft, nontender, nondistended. Bowel sounds present. No organomegaly or mass.  EXTREMITIES: No pedal edema, cyanosis, or clubbing.  NEUROLOGIC: Intubated, sedated   pSYCHIATRIC sedated SKIN: No obvious rash, lesion, or ulcer.    LABORATORY PANEL:   CBC Recent Labs  Lab 07/03/18 0345  WBC 26.2*  HGB 11.4*  HCT 31.3*  PLT 113*   ------------------------------------------------------------------------------------------------------------------  Chemistries  Recent Labs  Lab 07/01/18 0206  07/03/18 0800  NA 131*   < > 134*  K 3.8   < > 4.0  CL 102   < > 98  CO2 12*   < > 27  GLUCOSE 88   < > 134*  BUN QUANTITY NOT SUFFICIENT, UNABLE TO PERFORM TEST   < > 60*  CREATININE QUANTITY NOT SUFFICIENT, UNABLE TO PERFORM TEST   < > 3.08*  CALCIUM 7.9*   < > 7.4*  MG  --    < > 2.1  AST 83*  --   --    ALT 18  --   --   ALKPHOS 128*  --   --   BILITOT QUANTITY NOT SUFFICIENT, UNABLE TO PERFORM TEST  --   --    < > = values in this interval not displayed.   ------------------------------------------------------------------------------------------------------------------  Cardiac Enzymes Recent Labs  Lab 07/01/18 0431  TROPONINI 0.05*   ------------------------------------------------------------------------------------------------------------------  RADIOLOGY:  Dg Chest Port 1 View  Result Date: 07/03/2018 CLINICAL DATA:  Respiratory failure EXAM: PORTABLE CHEST 1 VIEW COMPARISON:  Portable exam 0434 hours compared to 07/01/2018 FINDINGS: Nasogastric tube extends into stomach. Tip of endotracheal tube projects 6.1 cm above carina. External pacing leads in EKG leads project over chest. Normal heart size, mediastinal contours, and pulmonary vascularity. Persistent consolidation RIGHT lower lobe. Remaining lungs grossly clear. No definite pleural effusion or pneumothorax. IMPRESSION: Persistent consolidation RIGHT lower lobe. Electronically Signed   By: Lavonia Dana M.D.   On: 07/03/2018 08:07    EKG:   Orders placed or performed during the hospital encounter of 07/01/18  . ED EKG  . ED EKG  . EKG 12-Lead  . EKG 12-Lead    ASSESSMENT AND PLAN:   48 year old male with history of AIDS, latent TB admitted for acute respiratory failure and septic shock  Hypoxic respiratory failure, continue full vent support  #1 .septic shock secondary to bilateral pneumonia, patient has polymicrobial bacteremia with staph aureus, E. coli,  GBS source likely lung.,  Continue meropenem.,  On vasopressin, Levophed.  2.  Acute respiratory failure, patient is intubated,, sedated.  History of cavitary pneumonia, admitted in February that time post sputum AFB that  were sent, which were negative, patient also had bronc following discharge which were negative for AFB.  3 .  Acute renal failure with ATN  secondary to septic shock, ; renal function not improved yet.,  Continue bicarb drip started on CRRT. #4. HIV, recent CD4 count 403, not on Elnoria Howard as not engaged in the care as per ID documentation. Prognosis guarded, high risk for cardiac arrest.  Condition critical, ICU physician spoke to patient's mom, wife.  All the records are reviewed and case discussed with Care Management/Social Workerr. Management plans discussed with the patient, family and they are in agreement.  CODE STATUS: DNR  TOTAL TIME TAKING CARE OF THIS PATIENT: 38 minutes.   Patient is critical to plan for discharge disposition. Epifanio Lesches M.D on 07/03/2018 at 11:11 AM  Between 7am to 6pm - Pager - [phone]  After 6pm go to [email or web address] - password [email or web address] [facility]  Office  [phone]  CC: Primary care physician; Patient, No Pcp Per   Note: This dictation was prepared with Dragon dictation along with smaller phrase technology. Any transcriptional errors that result from this process are unintentional.

## 2018-07-03 NOTE — Progress Notes (Signed)
Initial Nutrition Assessment  RD working remotely.  DOCUMENTATION CODES:   Not applicable  INTERVENTION:  Patient was started on Vital High Protein at trickle rate of 20 mL/hr. Recommend continuing trickle rate until patient is on a lower pressor dose.  Once patient more stable, recommend switching to Vital AF 1.2 Cal at 20 mL/hr and advancing by 15 mL/hr every 8 hours to goal rate of 50 mL/hr. Also provide Pro-Stat 30 mL BID. Goal regimen provides 1958 kcal, 120 grams of protein, 972 mL H2O daily.  Provide minimum free water flush of 20-30 mL Q4hrs to maintain tube patency.  Provide B-complex with C QHS per tube.  Monitor magnesium, potassium, and phosphorus daily for at least 3 days, MD to replete as needed, as pt is at risk for refeeding syndrome.  NUTRITION DIAGNOSIS:   Inadequate oral intake related to inability to eat as evidenced by NPO status.  GOAL:   Provide needs based on ASPEN/SCCM guidelines  MONITOR:   Vent status, Labs, Weight trends, TF tolerance, I & O's  REASON FOR ASSESSMENT:   Ventilator, Consult Enteral/tube feeding initiation and management  ASSESSMENT:   48 year old male with PMHx of Tb (dx 02/2018) and HIV (not currently on treatment) admitted with acute hypoxic and hypercapnic respiratory failure requiring intubation on 6/9, AKI, septic/hypovolemic shock, also suffered transient cardiac arrest.   Patient intubated and sedated. On PRVC mode with FiO2 50% and PEEP 8 cmH2O. Abdomen soft per RN documentation. No documented BMs. Patient was started on CRRT yesterday with no UF. Suspect patient is malnourished, but unable to confirm without completing NFPE.  Enteral Access: 16 Fr. OGT placed 6/9; terminates in stomach per abdominal x-ray 6/9; cm marking not documented at this time  MAP: 72-90 mmHg  Patient is currently intubated on ventilator support Ve: 12.8 L/min Temp (24hrs), Avg:98.1 F (36.7 C), Min:96.8 F (36 C), Max:99.8 F (37.7  C)  Propofol: 12.06 mL/hr (318 kcal daily)  Medications reviewed and include: Solu-Cortef 100 mg Q8hrs IV, Novolog 0-15 units Q4hrs, pantoprazole, fentanyl gtt, meropenem, norepinephrine gtt at 24 mcg/min, propofol gtt, sodium bicarbonate 150 mEQ in D5 at 50 mL/hr (60 grams dextrose, 204 kcal daily), vasopressin gtt at 0.03 units/min.  Labs reviewed: CBG 119-130, Sodium 134, BUN 60, Creatinine 3.08.  I/O: 35 mL UOP yesterday  Weight trend: per chart patient was 62.6 kg on 03/18/2018 and 66.8 kg on 03/27/2018; suspect current weight of 72.8 kg is falsely eleevated  NUTRITION - FOCUSED PHYSICAL EXAM:  Unable to complete at this time.  Diet Order:   Diet Order            Diet NPO time specified  Diet effective now             EDUCATION NEEDS:   No education needs have been identified at this time  Skin:  Skin Assessment: Reviewed RN Assessment  Last BM:  Unknown  Height:   Ht Readings from Last 1 Encounters:  07/01/18 6' (1.829 m)   Weight:   Wt Readings from Last 1 Encounters:  07/03/18 72.8 kg   Ideal Body Weight:  80.9 kg  BMI:  Body mass index is 21.77 kg/m.  Estimated Nutritional Needs:   Kcal:  1992 (PSU 2003b w/ MSJ 1581, Ve 12.6, Tmax 37.7)  Protein:  110-130 grams (1.5-1.8 grams/kg)  Fluid:  2 L/day  Willey Blade, MS, RD, LDN Office: [phone] Pager: [phone] After Hours/Weekend Pager: [phone]

## 2018-07-03 NOTE — Progress Notes (Signed)
Date of Admission:  07/01/2018    Remains intubated 2 pressors 07/02/18 CRRT   Medications:  . B-complex with vitamin C  1 tablet Per Tube QHS  . chlorhexidine gluconate (MEDLINE KIT)  15 mL Mouth Rinse BID  . feeding supplement (VITAL HIGH PROTEIN)  1,000 mL Per Tube Q24H  . guaiFENesin  600 mg Oral BID  . heparin injection (subcutaneous)  5,000 Units Subcutaneous Q12H  . hydrocortisone sod succinate (SOLU-CORTEF) inj  100 mg Intravenous Q8H  . insulin aspart  0-15 Units Subcutaneous Q4H  . ipratropium-albuterol  3 mL Nebulization Q4H  . mouth rinse  15 mL Mouth Rinse 10 times per day  . pantoprazole (PROTONIX) IV  40 mg Intravenous Q24H    Objective:   Vital signs in last 24 hours: Temp:  [96.8 F (36 C)-99.8 F (37.7 C)] 97.4 F (36.3 C) (06/11 0400) Pulse Rate:  [97-112] 97 (06/11 0500) Resp:  [24-32] 30 (06/11 0700) BP: (88-144)/(42-89) 88/65 (06/11 0700) SpO2:  [96 %-100 %] 98 % (06/11 0516) FiO2 (%):  [50 %-90 %] 50 % (06/11 0830) Weight:  [72.8 kg] 72.8 kg (06/11 0442)    Patient Vitals for the past 24 hrs:  BP Temp Temp src Pulse Resp SpO2 Weight  07/03/18 0700 (!) 88/65 - - - (!) 30 - -  07/03/18 0630 (!) 88/64 - - - (!) 27 - -  07/03/18 0600 92/64 - - - (!) 30 - -  07/03/18 0530 93/65 - - - (!) 27 - -  07/03/18 0516 - - - - - 98 % -  07/03/18 0500 94/67 - - 97 (!) 31 98 % -  07/03/18 0442 - - - - - - 72.8 kg  07/03/18 0430 106/80 - - - (!) 29 - -  07/03/18 0400 104/75 (!) 97.4 F (36.3 C) Bladder 97 (!) 26 98 % -  07/03/18 0330 104/75 - - - (!) 27 - -  07/03/18 0300 97/70 - - (!) 105 (!) 27 98 % -  07/03/18 0230 97/72 - - (!) 108 (!) 30 97 % -  07/03/18 0200 101/74 - - - (!) 28 - -  07/03/18 0130 102/73 - - (!) 103 (!) 27 96 % -  07/03/18 0100 106/73 - - (!) 102 (!) 30 98 % -  07/03/18 0045 102/76 - - (!) 102 (!) 28 98 % -  07/03/18 0030 99/71 (!) 97 F (36.1 C) Bladder (!) 102 (!) 30 98 % -  07/03/18 0000 109/78 (!) 96.8 F (36 C) Bladder (!) 101  (!) 30 98 % -  07/02/18 2330 122/87 - - - (!) 30 99 % -  07/02/18 2300 122/85 - - - (!) 27 (P) 99 % -  07/02/18 2245 112/78 - - - (!) 30 (P) 98 % -  07/02/18 2230 129/89 (!) 97 F (36.1 C) - - (!) 30 (P) 98 % -  07/02/18 2200 126/86 (!) 97.3 F (36.3 C) - - (!) 30 (P) 98 % -  07/02/18 2100 121/85 97.9 F (36.6 C) Bladder (!) 103 (!) 30 98 % -  07/02/18 2055 - - - - - 98 % -  07/02/18 2030 110/76 98.2 F (36.8 C) - (!) 103 (!) 25 97 % -  07/02/18 2000 107/73 98.4 F (36.9 C) Bladder (!) 104 (!) 30 97 % -  07/02/18 1900 109/70 98.4 F (36.9 C) - (!) 108 (!) 30 96 % -  07/02/18 1845 103/71 - - (!) 107 (!)  30 97 % -  07/02/18 1830 99/82 - - (!) 107 (!) 30 96 % -  07/02/18 1815 111/84 - - (!) 107 (!) 30 97 % -  07/02/18 1800 110/79 98.1 F (36.7 C) - (!) 107 (!) 28 98 % -  07/02/18 1750 118/67 - - (!) 106 (!) 31 97 % -  07/02/18 1745 113/75 - - (!) 107 (!) 29 97 % -  07/02/18 1740 108/74 - - (!) 106 (!) 30 100 % -  07/02/18 1735 122/74 - - (!) 107 (!) 30 98 % -  07/02/18 1730 122/79 - - (!) 106 (!) 30 98 % -  07/02/18 1715 97/65 - - (!) 105 (!) 30 98 % -  07/02/18 1700 104/71 98 F (36.7 C) - (!) 104 (!) 30 98 % -  07/02/18 1645 108/73 - - (!) 103 (!) 30 98 % -  07/02/18 1630 110/75 - - (!) 103 (!) 30 99 % -  07/02/18 1615 113/79 - - (!) 102 (!) 30 98 % -  07/02/18 1600 125/76 (!) 97.2 F (36.2 C) Bladder (!) 103 (!) 30 99 % -  07/02/18 1545 (!) 144/87 - - (!) 104 (!) 30 100 % -  07/02/18 1530 139/81 - - (!) 104 (!) 30 100 % -  07/02/18 1500 138/83 99.2 F (37.3 C) - (!) 107 (!) 30 100 % -  07/02/18 1400 119/78 99.8 F (37.7 C) - (!) 112 (!) 24 99 % -  07/02/18 1300 113/78 99.3 F (37.4 C) - (!) 112 (!) 30 100 % -  07/02/18 1200 102/67 99 F (37.2 C) Core (!) 111 (!) 32 99 % -  07/02/18 1100 (!) 97/42 98.4 F (36.9 C) - (!) 112 (!) 31 100 % -  07/02/18 1000 90/61 98.1 F (36.7 C) - - (!) 31 - -    PHYSICAL EXAM:  General:intubated , on 2 pressor Head: Normocephalic,  without obvious abnormality, atraumatic. Temporal wasting  Left IJ dialysis catheter Rt femoral  Lungs: b/l air entry crepts rt side > left  Heart: s1s2  Abdomen: Soft, non-tender,not distended. Bowel sounds normal. No masses Extremities: no ankle edema Skin: No rashes or lesions. Or bruising Lymph: Cervical, supraclavicular normal. Neurologic: opens eyes to calling his name Lab Results Recent Labs    07/02/18 0517  07/03/18 0345 07/03/18 0800  WBC 12.0*  --  26.2*  --   HGB 12.3*  --  11.4*  --   HCT 35.0*  --  31.3*  --   NA 134*   < > 136 134*  K 4.3   < > 3.7 4.0  CL 100   < > 100 98  CO2 18*   < > 25 27  BUN 103*   < > 67* 60*  CREATININE 5.97*   < > 3.48* 3.08*   < > = values in this interval not displayed.   Liver Panel Recent Labs    07/01/18 0206  07/03/18 0345 07/03/18 0800  PROT 7.7  --   --   --   ALBUMIN QUANTITY NOT SUFFICIENT, UNABLE TO PERFORM TEST   < > 1.5* 1.5*  AST 83*  --   --   --   ALT 18  --   --   --   ALKPHOS 128*  --   --   --   BILITOT QUANTITY NOT SUFFICIENT, UNABLE TO PERFORM TEST  --   --   --    < > =  values in this interval not displayed.   Sedimentation Rate No results for input(s): ESRSEDRATE in the last 72 hours. C-Reactive Protein No results for input(s): CRP in the last 72 hours.  Microbiology:  Studies/Results: Dg Chest Port 1 View  Result Date: 07/03/2018 CLINICAL DATA:  Respiratory failure EXAM: PORTABLE CHEST 1 VIEW COMPARISON:  Portable exam 0434 hours compared to 07/01/2018 FINDINGS: Nasogastric tube extends into stomach. Tip of endotracheal tube projects 6.1 cm above carina. External pacing leads in EKG leads project over chest. Normal heart size, mediastinal contours, and pulmonary vascularity. Persistent consolidation RIGHT lower lobe. Remaining lungs grossly clear. No definite pleural effusion or pneumothorax. IMPRESSION: Persistent consolidation RIGHT lower lobe. Electronically Signed   By: Lavonia Dana M.D.   On:  07/03/2018 08:07     Assessment/Plan: Polymicrobial bacteremia- staph aureus, E.coli and GBS bacteremia- ??source ? Lung Will need TEE Will need repeat culture Continue meorpenem until susceptibility of e.coli available Will need CT chest and abdomen as the source of bacteremia unclear  Acute hypoxic respiratory failure intubated  Rt lower lobe pneumonia in a patient with cavitary lesion- Investigated for TB in march with 3 sputum and one bronch specimen- all negative including NAAT  Acute renal failure- Due to ATN from sepsis on CRRT ? HIV- cd4 is 403, VL 29K in Feb 2020- not on HAART as not engaged in care- repeat ;abs cd4 is 45- will send beta d glucan- once able to take PO can start treatment  Discussed with care team

## 2018-07-04 ENCOUNTER — Inpatient Hospital Stay: Payer: Medicaid Other

## 2018-07-04 DIAGNOSIS — I4891 Unspecified atrial fibrillation: Secondary | ICD-10-CM

## 2018-07-04 LAB — RENAL FUNCTION PANEL
Albumin: 1.5 g/dL — ABNORMAL LOW (ref 3.5–5.0)
Albumin: 1.5 g/dL — ABNORMAL LOW (ref 3.5–5.0)
Albumin: 1.6 g/dL — ABNORMAL LOW (ref 3.5–5.0)
Albumin: 1.5 g/dL — ABNORMAL LOW (ref 3.5–5.0)
Albumin: 1.6 g/dL — ABNORMAL LOW (ref 3.5–5.0)
Albumin: 1.6 g/dL — ABNORMAL LOW (ref 3.5–5.0)
Anion gap: 8 (ref 5–15)
Anion gap: 5 (ref 5–15)
Anion gap: 8 (ref 5–15)
Anion gap: 7 (ref 5–15)
Anion gap: 8 (ref 5–15)
Anion gap: 9 (ref 5–15)
BUN: 45 mg/dL — ABNORMAL HIGH (ref 6–20)
BUN: 42 mg/dL — ABNORMAL HIGH (ref 6–20)
BUN: 41 mg/dL — ABNORMAL HIGH (ref 6–20)
BUN: 39 mg/dL — ABNORMAL HIGH (ref 6–20)
BUN: 36 mg/dL — ABNORMAL HIGH (ref 6–20)
BUN: 35 mg/dL — ABNORMAL HIGH (ref 6–20)
CO2: 23 mmol/L (ref 22–32)
CO2: 25 mmol/L (ref 22–32)
CO2: 25 mmol/L (ref 22–32)
CO2: 25 mmol/L (ref 22–32)
CO2: 25 mmol/L (ref 22–32)
CO2: 23 mmol/L (ref 22–32)
Calcium: 7.4 mg/dL — ABNORMAL LOW (ref 8.9–10.3)
Calcium: 7.2 mg/dL — ABNORMAL LOW (ref 8.9–10.3)
Calcium: 7.9 mg/dL — ABNORMAL LOW (ref 8.9–10.3)
Calcium: 8 mg/dL — ABNORMAL LOW (ref 8.9–10.3)
Calcium: 7.9 mg/dL — ABNORMAL LOW (ref 8.9–10.3)
Calcium: 7.7 mg/dL — ABNORMAL LOW (ref 8.9–10.3)
Chloride: 104 mmol/L (ref 98–111)
Chloride: 105 mmol/L (ref 98–111)
Chloride: 102 mmol/L (ref 98–111)
Chloride: 103 mmol/L (ref 98–111)
Chloride: 102 mmol/L (ref 98–111)
Chloride: 101 mmol/L (ref 98–111)
Creatinine, Ser: 2.12 mg/dL — ABNORMAL HIGH (ref 0.61–1.24)
Creatinine, Ser: 1.91 mg/dL — ABNORMAL HIGH (ref 0.61–1.24)
Creatinine, Ser: 1.77 mg/dL — ABNORMAL HIGH (ref 0.61–1.24)
Creatinine, Ser: 1.75 mg/dL — ABNORMAL HIGH (ref 0.61–1.24)
Creatinine, Ser: 1.65 mg/dL — ABNORMAL HIGH (ref 0.61–1.24)
Creatinine, Ser: 1.65 mg/dL — ABNORMAL HIGH (ref 0.61–1.24)
GFR calc Af Amer: 41 mL/min — ABNORMAL LOW (ref >60)
GFR calc Af Amer: 47 mL/min — ABNORMAL LOW (ref >60)
GFR calc Af Amer: 51 mL/min — ABNORMAL LOW (ref >60)
GFR calc Af Amer: 52 mL/min — ABNORMAL LOW (ref >60)
GFR calc Af Amer: 56 mL/min — ABNORMAL LOW (ref >60)
GFR calc Af Amer: 56 mL/min — ABNORMAL LOW (ref >60)
GFR calc non Af Amer: 36 mL/min — ABNORMAL LOW (ref >60)
GFR calc non Af Amer: 41 mL/min — ABNORMAL LOW (ref >60)
GFR calc non Af Amer: 44 mL/min — ABNORMAL LOW (ref >60)
GFR calc non Af Amer: 45 mL/min — ABNORMAL LOW (ref >60)
GFR calc non Af Amer: 48 mL/min — ABNORMAL LOW (ref >60)
GFR calc non Af Amer: 48 mL/min — ABNORMAL LOW (ref >60)
Glucose, Bld: 145 mg/dL — ABNORMAL HIGH (ref 70–99)
Glucose, Bld: 147 mg/dL — ABNORMAL HIGH (ref 70–99)
Glucose, Bld: 124 mg/dL — ABNORMAL HIGH (ref 70–99)
Glucose, Bld: 139 mg/dL — ABNORMAL HIGH (ref 70–99)
Glucose, Bld: 130 mg/dL — ABNORMAL HIGH (ref 70–99)
Glucose, Bld: 184 mg/dL — ABNORMAL HIGH (ref 70–99)
Phosphorus: 2.5 mg/dL (ref 2.5–4.6)
Phosphorus: 2.4 mg/dL — ABNORMAL LOW (ref 2.5–4.6)
Phosphorus: 2 mg/dL — ABNORMAL LOW (ref 2.5–4.6)
Phosphorus: 2.5 mg/dL (ref 2.5–4.6)
Phosphorus: 2.1 mg/dL — ABNORMAL LOW (ref 2.5–4.6)
Phosphorus: 2.6 mg/dL (ref 2.5–4.6)
Potassium: 3.9 mmol/L (ref 3.5–5.1)
Potassium: 3.8 mmol/L (ref 3.5–5.1)
Potassium: 4 mmol/L (ref 3.5–5.1)
Potassium: 4.1 mmol/L (ref 3.5–5.1)
Potassium: 4 mmol/L (ref 3.5–5.1)
Potassium: 4.2 mmol/L (ref 3.5–5.1)
Sodium: 135 mmol/L (ref 135–145)
Sodium: 135 mmol/L (ref 135–145)
Sodium: 135 mmol/L (ref 135–145)
Sodium: 135 mmol/L (ref 135–145)
Sodium: 135 mmol/L (ref 135–145)
Sodium: 133 mmol/L — ABNORMAL LOW (ref 135–145)

## 2018-07-04 LAB — ANA W/REFLEX IF POSITIVE: Anti Nuclear Antibody (ANA): NEGATIVE

## 2018-07-04 LAB — CBC WITH DIFFERENTIAL/PLATELET
Abs Immature Granulocytes: 1.71 10*3/uL — ABNORMAL HIGH (ref 0.00–0.07)
Basophils Absolute: 0 10*3/uL (ref 0.0–0.1)
Basophils Relative: 0 %
Eosinophils Absolute: 0 10*3/uL (ref 0.0–0.5)
Eosinophils Relative: 0 %
HCT: 26.1 % — ABNORMAL LOW (ref 39.0–52.0)
Hemoglobin: 9.3 g/dL — ABNORMAL LOW (ref 13.0–17.0)
Immature Granulocytes: 7 %
Lymphocytes Relative: 2 %
Lymphs Abs: 0.6 10*3/uL — ABNORMAL LOW (ref 0.7–4.0)
MCH: 33 pg (ref 26.0–34.0)
MCHC: 35.6 g/dL (ref 30.0–36.0)
MCV: 92.6 fL (ref 80.0–100.0)
Monocytes Absolute: 1.1 10*3/uL — ABNORMAL HIGH (ref 0.1–1.0)
Monocytes Relative: 5 %
Neutro Abs: 21 10*3/uL — ABNORMAL HIGH (ref 1.7–7.7)
Neutrophils Relative %: 86 %
Platelets: 86 10*3/uL — ABNORMAL LOW (ref 150–400)
RBC: 2.82 MIL/uL — ABNORMAL LOW (ref 4.22–5.81)
RDW: 15 % (ref 11.5–15.5)
WBC: 24.4 10*3/uL — ABNORMAL HIGH (ref 4.0–10.5)
nRBC: 0 % (ref 0.0–0.2)

## 2018-07-04 LAB — HIV-1 RNA QUANT-NO REFLEX-BLD
HIV 1 RNA Quant: 1470000 copies/mL
LOG10 HIV-1 RNA: 6.167 log10copy/mL

## 2018-07-04 LAB — GLUCOSE, CAPILLARY
Glucose-Capillary: 100 mg/dL — ABNORMAL HIGH (ref 70–99)
Glucose-Capillary: 120 mg/dL — ABNORMAL HIGH (ref 70–99)
Glucose-Capillary: 132 mg/dL — ABNORMAL HIGH (ref 70–99)
Glucose-Capillary: 135 mg/dL — ABNORMAL HIGH (ref 70–99)
Glucose-Capillary: 136 mg/dL — ABNORMAL HIGH (ref 70–99)
Glucose-Capillary: 151 mg/dL — ABNORMAL HIGH (ref 70–99)

## 2018-07-04 LAB — BLOOD GAS, ARTERIAL
Acid-base deficit: 0.5 mmol/L (ref 0.0–2.0)
Bicarbonate: 24.2 mmol/L (ref 20.0–28.0)
FIO2: 0.4
MECHVT: 500 mL
O2 Saturation: 98.1 %
PEEP: 8 cmH2O
Patient temperature: 37
RATE: 30 resp/min
pCO2 arterial: 39 mmHg (ref 32.0–48.0)
pH, Arterial: 7.4 (ref 7.350–7.450)
pO2, Arterial: 106 mmHg (ref 83.0–108.0)

## 2018-07-04 LAB — MAGNESIUM
Magnesium: 1.9 mg/dL (ref 1.7–2.4)
Magnesium: 2 mg/dL (ref 1.7–2.4)
Magnesium: 2 mg/dL (ref 1.7–2.4)
Magnesium: 2 mg/dL (ref 1.7–2.4)
Magnesium: 2.1 mg/dL (ref 1.7–2.4)

## 2018-07-04 LAB — CULTURE, BLOOD (ROUTINE X 2): Special Requests: ADEQUATE

## 2018-07-04 MED ORDER — DEXMEDETOMIDINE HCL IN NACL 400 MCG/100ML IV SOLN
0.4000 ug/kg/h | INTRAVENOUS | Status: DC
  Administered 2018-07-04: 1 ug/kg/h via INTRAVENOUS
  Administered 2018-07-04: 0.6 ug/kg/h via INTRAVENOUS
  Administered 2018-07-04: 1 ug/kg/h via INTRAVENOUS
  Administered 2018-07-05: 1.2 ug/kg/h via INTRAVENOUS
  Administered 2018-07-05: 1 ug/kg/h via INTRAVENOUS
  Administered 2018-07-05 (×3): 1.2 ug/kg/h via INTRAVENOUS
  Administered 2018-07-06: 02:00:00 1 ug/kg/h via INTRAVENOUS
  Administered 2018-07-06 (×2): 1.2 ug/kg/h via INTRAVENOUS
  Administered 2018-07-06: 1 ug/kg/h via INTRAVENOUS
  Administered 2018-07-06: 1.2 ug/kg/h via INTRAVENOUS
  Administered 2018-07-07: 1 ug/kg/h via INTRAVENOUS
  Administered 2018-07-07: 1.2 ug/kg/h via INTRAVENOUS
  Administered 2018-07-07 – 2018-07-08 (×2): 1 ug/kg/h via INTRAVENOUS
  Administered 2018-07-08: 1.2 ug/kg/h via INTRAVENOUS
  Filled 2018-07-04 (×20): qty 100

## 2018-07-04 MED ORDER — SENNA 8.6 MG PO TABS
1.0000 | ORAL_TABLET | Freq: Every day | ORAL | Status: DC
  Administered 2018-07-07: 8.6 mg via ORAL
  Filled 2018-07-04: qty 1

## 2018-07-04 MED ORDER — AMIODARONE HCL IN DEXTROSE 360-4.14 MG/200ML-% IV SOLN
60.0000 mg/h | INTRAVENOUS | Status: AC
  Administered 2018-07-04 (×2): 60 mg/h via INTRAVENOUS
  Filled 2018-07-04: qty 200

## 2018-07-04 MED ORDER — AMIODARONE LOAD VIA INFUSION
150.0000 mg | Freq: Once | INTRAVENOUS | Status: AC
  Administered 2018-07-04: 150 mg via INTRAVENOUS
  Filled 2018-07-04: qty 83.34

## 2018-07-04 MED ORDER — DOCUSATE SODIUM 100 MG PO CAPS
100.0000 mg | ORAL_CAPSULE | Freq: Two times a day (BID) | ORAL | Status: DC
  Filled 2018-07-04: qty 1

## 2018-07-04 MED ORDER — AMIODARONE HCL IN DEXTROSE 360-4.14 MG/200ML-% IV SOLN
30.0000 mg/h | INTRAVENOUS | Status: DC
  Administered 2018-07-05 – 2018-07-06 (×3): 30 mg/h via INTRAVENOUS
  Filled 2018-07-04 (×3): qty 200

## 2018-07-04 MED ORDER — AMIODARONE HCL IN DEXTROSE 360-4.14 MG/200ML-% IV SOLN
INTRAVENOUS | Status: AC
  Administered 2018-07-04: 16:00:00 60 mg/h via INTRAVENOUS
  Filled 2018-07-04: qty 200

## 2018-07-04 NOTE — Progress Notes (Signed)
[facility]  ROUNDING NOTE   Subjective:  Patient seen at bedside. He was agitated. Subsequently placed back on Precedex. Continues to have significant renal dysfunction with no urine output. Still on CRRT.  Objective:  Vital signs in last 24 hours:  Temp:  [96.3 F (35.7 C)-98.8 F (37.1 C)] 96.3 F (35.7 C) (06/12 1200) Pulse Rate:  [96-116] 107 (06/12 1200) Resp:  [16-30] 30 (06/12 1300) BP: (85-110)/(56-89) 106/89 (06/12 1300) SpO2:  [89 %-100 %] 94 % (06/12 1230) FiO2 (%):  [40 %-50 %] 40 % (06/12 1230) Weight:  [72.9 kg] 72.9 kg (06/12 0251)  Weight change: 0.1 kg Filed Weights   07/01/18 0930 07/03/18 0442 07/04/18 0251  Weight: 67 kg 72.8 kg 72.9 kg    Intake/Output: I/O last 3 completed shifts: In: 3406.1 [I.V.:2573.8; Other:50; NG/GT:390; IV Piggyback:392.3] Out: 151 [Urine:101; Emesis/NG output:50]   Intake/Output this shift:  Total I/O In: 75.2 [I.V.:75.2] Out: 0   Physical Exam: General: Critically ill-appearing  Head: Severe temporal wasting  Eyes: Anicteric  Neck: Supple, trachea midline  Lungs:  Basilar rales, vent assisted  Heart: S1S2 no rubs  Abdomen:  Soft, nontender, bowel sounds present  Extremities: no peripheral edema.  Neurologic: sedated  Skin: No lesions  Access: Left femoral temporary dialysis catheter    Basic Metabolic Panel: Recent Labs  Lab 07/03/18 1802 07/03/18 2310 07/04/18 0335 07/04/18 0739 07/04/18 1118  NA 131* 135 135 135 135  K 4.0 3.9 3.8 4.0 4.1  CL 99 104 105 102 103  CO2 25 23 25 25 25   GLUCOSE 118* 145* 147* 124* 139*  BUN 50* 45* 42* 41* 39*  CREATININE 2.56* 2.12* 1.91* 1.77* 1.75*  CALCIUM 7.5* 7.4* 7.2* 7.9* 8.0*  MG 2.0 1.9 1.9 2.0 2.0  PHOS 2.9 2.5 2.4* 2.0* 2.5    Liver Function Tests: Recent Labs  Lab 07/01/18 0206  07/03/18 1802 07/03/18 2310 07/04/18 0335 07/04/18 0739 07/04/18 1118  AST 83*  --   --   --   --   --   --   ALT 18  --   --   --   --   --   --    ALKPHOS 128*  --   --   --   --   --   --   BILITOT QUANTITY NOT SUFFICIENT, UNABLE TO PERFORM TEST  --   --   --   --   --   --   PROT 7.7  --   --   --   --   --   --   ALBUMIN QUANTITY NOT SUFFICIENT, UNABLE TO PERFORM TEST   < > 1.6* 1.5* 1.5* 1.6* 1.5*   < > = values in this interval not displayed.   No results for input(s): LIPASE, AMYLASE in the last 168 hours. No results for input(s): AMMONIA in the last 168 hours.  CBC: Recent Labs  Lab 07/01/18 0119  07/01/18 1400 07/02/18 0517 07/02/18 1151 07/03/18 0345 07/04/18 0340  WBC 6.4   < > 5.0 12.0* 16.0* 26.2* 24.4*  NEUTROABS 5.2  --  4.2  --  14.4* 19.6* 21.0*  HGB 15.6   < > 13.2 12.3* 12.4* 11.4* 9.3*  HCT 45.0   < > 39.7 35.0* 33.7* 31.3* 26.1*  MCV 96.6   < > 100.3* 95.1 92 92.3 92.6  PLT 270   < > 236 152 114* 113* 86*   < > = values in this interval not  displayed.    Cardiac Enzymes: Recent Labs  Lab 07/01/18 0206 07/01/18 0431  CKTOTAL  --  220  TROPONINI QUANTITY NOT SUFFICIENT, UNABLE TO PERFORM TEST 0.05*    BNP: Invalid input(s): POCBNP  CBG: Recent Labs  Lab 07/03/18 2056 07/04/18 0011 07/04/18 0332 07/04/18 0734 07/04/18 1144  GLUCAP 106* 135* 151* 100* 132*    Microbiology: Results for orders placed or performed during the hospital encounter of 07/01/18  SARS Coronavirus 2 (CEPHEID- Performed in [facility] hospital lab), Hosp Order     Status: None   Collection Time: 07/01/18  1:19 AM   Specimen: Nasopharyngeal Swab  Result Value Ref Range Status   SARS Coronavirus 2 NEGATIVE NEGATIVE Final    Comment: (NOTE) If result is NEGATIVE SARS-CoV-2 target nucleic acids are NOT DETECTED. The SARS-CoV-2 RNA is generally detectable in upper and lower  respiratory specimens during the acute phase of infection. The lowest  concentration of SARS-CoV-2 viral copies this assay can detect is 250  copies / mL. A negative result does not preclude SARS-CoV-2 infection  and should not be used as the  sole basis for treatment or other  patient management decisions.  A negative result may occur with  improper specimen collection / handling, submission of specimen other  than nasopharyngeal swab, presence of viral mutation(s) within the  areas targeted by this assay, and inadequate number of viral copies  (<250 copies / mL). A negative result must be combined with clinical  observations, patient history, and epidemiological information. If result is POSITIVE SARS-CoV-2 target nucleic acids are DETECTED. The SARS-CoV-2 RNA is generally detectable in upper and lower  respiratory specimens dur ing the acute phase of infection.  Positive  results are indicative of active infection with SARS-CoV-2.  Clinical  correlation with patient history and other diagnostic information is  necessary to determine patient infection status.  Positive results do  not rule out bacterial infection or co-infection with other viruses. If result is PRESUMPTIVE POSTIVE SARS-CoV-2 nucleic acids MAY BE PRESENT.   A presumptive positive result was obtained on the submitted specimen  and confirmed on repeat testing.  While 2019 novel coronavirus  (SARS-CoV-2) nucleic acids may be present in the submitted sample  additional confirmatory testing may be necessary for epidemiological  and / or clinical management purposes  to differentiate between  SARS-CoV-2 and other Sarbecovirus currently known to infect humans.  If clinically indicated additional testing with an alternate test  methodology [phone]) is advised. The SARS-CoV-2 RNA is generally  detectable in upper and lower respiratory sp ecimens during the acute  phase of infection. The expected result is Negative. Fact Sheet for Patients:  StrictlyIdeas.no Fact Sheet for Healthcare Providers: BankingDealers.co.za This test is not yet approved or cleared by the [place] FDA and has been authorized for detection  and/or diagnosis of SARS-CoV-2 by FDA under an Emergency Use Authorization (EUA).  This EUA will remain in effect (meaning this test can be used) for the duration of the COVID-19 declaration under Section 564(b)(1) of the Act, 21 U.S.C. section 360bbb-3(b)(1), unless the authorization is terminated or revoked sooner. Performed at [facility], [address] [place], [place] [address]   Blood culture (routine x 2)     Status: Abnormal   Collection Time: 07/01/18  1:19 AM   Specimen: BLOOD  Result Value Ref Range Status   Specimen Description   Final    BLOOD LEFT ANTECUBITAL Performed at [facility], [place] [address],  [place], [place] [address]    Special Requests   Final    BOTTLES DRAWN AEROBIC AND ANAEROBIC Blood Culture results may not be optimal due to an excessive volume of blood received in culture bottles Performed at [facility], [address] [place], [place] [address]    Culture  Setup Time   Final    GRAM NEGATIVE RODS IN BOTH AEROBIC AND ANAEROBIC BOTTLES CRITICAL RESULT CALLED TO, READ BACK BY AND VERIFIED WITH: CHRISTINE KATSOUDAS AT 6314 07/01/18 SG Performed at [facility], [address] [place], [place] [address]    Culture ESCHERICHIA COLI (A)  Final   Report Status 07/03/2018 FINAL  Final   Organism ID, Bacteria ESCHERICHIA COLI  Final      Susceptibility   Escherichia coli - MIC*    AMPICILLIN 4 SENSITIVE Sensitive     CEFAZOLIN <=4 SENSITIVE Sensitive     CEFEPIME <=1 SENSITIVE Sensitive     CEFTAZIDIME <=1 SENSITIVE Sensitive     CEFTRIAXONE <=1 SENSITIVE Sensitive     CIPROFLOXACIN <=0.25 SENSITIVE Sensitive     GENTAMICIN <=1 SENSITIVE Sensitive     IMIPENEM <=0.25 SENSITIVE Sensitive     TRIMETH/SULFA <=20 SENSITIVE Sensitive     AMPICILLIN/SULBACTAM <=2 SENSITIVE Sensitive     PIP/TAZO <=4 SENSITIVE Sensitive     Extended ESBL NEGATIVE Sensitive     * ESCHERICHIA COLI  Blood culture (routine x 2)      Status: Abnormal   Collection Time: 07/01/18  3:09 AM   Specimen: BLOOD  Result Value Ref Range Status   Specimen Description   Final    BLOOD LEFT ARM Performed at [facility], [address], [place], [place] [address]    Special Requests   Final    BOTTLES DRAWN AEROBIC AND ANAEROBIC Blood Culture adequate volume Performed at [facility], [place], [place] [address]    Culture  Setup Time   Final    GRAM POSITIVE COCCI IN BOTH AEROBIC AND ANAEROBIC BOTTLES GRAM NEGATIVE RODS IN BOTH AEROBIC AND ANAEROBIC BOTTLES CRITICAL RESULT CALLED TO, READ BACK BY AND VERIFIED WITH: CHRISTINE KATSOUDAS AT 7412 07/01/18 SG Performed at [facility], [place] [address], [place], [place] [address]    Culture (A)  Final    ESCHERICHIA COLI ORGANISM 1 SUSCEPTIBILITIES PERFORMED ON PREVIOUS CULTURE WITHIN THE LAST 5 DAYS. STAPHYLOCOCCUS AUREUS VIRIDANS STREPTOCOCCUS THE SIGNIFICANCE OF ISOLATING THIS ORGANISM FROM A SINGLE SET OF BLOOD CULTURES WHEN MULTIPLE SETS ARE DRAWN IS UNCERTAIN. PLEASE NOTIFY THE MICROBIOLOGY DEPARTMENT WITHIN ONE WEEK IF SPECIATION AND SENSITIVITIES ARE REQUIRED. GROUP B STREP(S.AGALACTIAE)ISOLATED    Report Status 07/04/2018 FINAL  Final   Organism ID, Bacteria STAPHYLOCOCCUS AUREUS  Final   Organism ID, Bacteria GROUP B STREP(S.AGALACTIAE)ISOLATED  Final      Susceptibility   Group b strep(s.agalactiae)isolated - MIC*    CLINDAMYCIN >=1 RESISTANT Resistant     AMPICILLIN <=0.25 SENSITIVE Sensitive     ERYTHROMYCIN >=8 RESISTANT Resistant     VANCOMYCIN 0.5 SENSITIVE Sensitive     CEFTRIAXONE <=0.12 SENSITIVE Sensitive     LEVOFLOXACIN 1 SENSITIVE Sensitive     * GROUP B STREP(S.AGALACTIAE)ISOLATED   Staphylococcus aureus - MIC*    CIPROFLOXACIN 1 SENSITIVE Sensitive     ERYTHROMYCIN >=8 RESISTANT Resistant     GENTAMICIN <=0.5 SENSITIVE Sensitive     OXACILLIN 0.5 SENSITIVE Sensitive     TETRACYCLINE <=1 SENSITIVE Sensitive      VANCOMYCIN 1  SENSITIVE Sensitive     TRIMETH/SULFA <=10 SENSITIVE Sensitive     CLINDAMYCIN RESISTANT Resistant     RIFAMPIN <=0.5 SENSITIVE Sensitive     Inducible Clindamycin POSITIVE Resistant     * STAPHYLOCOCCUS AUREUS  Blood Culture ID Panel (Reflexed)     Status: Abnormal   Collection Time: 07/01/18  3:09 AM  Result Value Ref Range Status   Enterococcus species NOT DETECTED NOT DETECTED Final   Listeria monocytogenes NOT DETECTED NOT DETECTED Final   Staphylococcus species DETECTED (A) NOT DETECTED Final    Comment: CRITICAL RESULT CALLED TO, READ BACK BY AND VERIFIED WITH: CHRISTINE KATSOUDAS AT 8127 07/01/18 SG    Staphylococcus aureus (BCID) DETECTED (A) NOT DETECTED Final    Comment: Methicillin (oxacillin) susceptible Staphylococcus aureus (MSSA). Preferred therapy is anti staphylococcal beta lactam antibiotic (Cefazolin or Nafcillin), unless clinically contraindicated. CRITICAL RESULT CALLED TO, READ BACK BY AND VERIFIED WITH: CHRISTINE KATSOUDAS AT 5170 07/01/18 SG    Methicillin resistance NOT DETECTED NOT DETECTED Final   Streptococcus species DETECTED (A) NOT DETECTED Final    Comment: CRITICAL RESULT CALLED TO, READ BACK BY AND VERIFIED WITH: CHRISTINE KATSOUDAS AT 1325 07/01/18 SG    Streptococcus agalactiae DETECTED (A) NOT DETECTED Final    Comment: CRITICAL RESULT CALLED TO, READ BACK BY AND VERIFIED WITH: CHRISTINE KATSOUDAS AT 0174 07/01/18 SG    Streptococcus pneumoniae NOT DETECTED NOT DETECTED Final   Streptococcus pyogenes NOT DETECTED NOT DETECTED Final   Acinetobacter baumannii NOT DETECTED NOT DETECTED Final   Enterobacteriaceae species DETECTED (A) NOT DETECTED Final    Comment: Enterobacteriaceae represent a large family of gram-negative bacteria, not a single organism. CRITICAL RESULT CALLED TO, READ BACK BY AND VERIFIED WITH: CHRISTINE KATSOUDAS AT 9449 07/01/18 SG    Enterobacter cloacae complex NOT DETECTED NOT DETECTED Final   Escherichia coli  DETECTED (A) NOT DETECTED Final    Comment: CRITICAL RESULT CALLED TO, READ BACK BY AND VERIFIED WITH: CHRISTINE KATSOUDAS AT 6759 07/01/18 SG    Klebsiella oxytoca NOT DETECTED NOT DETECTED Final   Klebsiella pneumoniae NOT DETECTED NOT DETECTED Final   Proteus species NOT DETECTED NOT DETECTED Final   Serratia marcescens NOT DETECTED NOT DETECTED Final   Carbapenem resistance NOT DETECTED NOT DETECTED Final   Haemophilus influenzae NOT DETECTED NOT DETECTED Final   Neisseria meningitidis NOT DETECTED NOT DETECTED Final   Pseudomonas aeruginosa NOT DETECTED NOT DETECTED Final   Candida albicans NOT DETECTED NOT DETECTED Final   Candida glabrata NOT DETECTED NOT DETECTED Final   Candida krusei NOT DETECTED NOT DETECTED Final   Candida parapsilosis NOT DETECTED NOT DETECTED Final   Candida tropicalis NOT DETECTED NOT DETECTED Final    Comment: Performed at [facility], [address] [place], [place] [address]  MRSA PCR Screening     Status: None   Collection Time: 07/01/18  9:53 AM   Specimen: Nasopharyngeal  Result Value Ref Range Status   MRSA by PCR NEGATIVE NEGATIVE Final    Comment:        The GeneXpert MRSA Assay (FDA approved for NASAL specimens only), is one component of a comprehensive MRSA colonization surveillance program. It is not intended to diagnose MRSA infection nor to guide or monitor treatment for MRSA infections. Performed at [facility], [address] [place], [place] [address]   Culture, respiratory (non-expectorated)     Status: None (Preliminary result)   Collection Time: 07/02/18  6:48 PM   Specimen: Tracheal Aspirate; Respiratory  Result Value Ref Range Status   Specimen Description   Final    TRACHEAL ASPIRATE Performed at [facility], [address] [place], [place] [address]    Special Requests   Final    NONE Performed at [facility], [address] [place], [place] [address]    Gram Stain    Final    RARE WBC PRESENT, PREDOMINANTLY PMN NO ORGANISMS SEEN    Culture   Final    NO GROWTH 2 DAYS Performed at [facility], [place] [address] [place], [place] [address]    Report Status PENDING  Incomplete  Culture, blood (routine x 2)     Status: None (Preliminary result)   Collection Time: 07/03/18 12:51 AM   Specimen: BLOOD  Result Value Ref Range Status   Specimen Description BLOOD RIGHT HAND  Final   Special Requests   Final    BOTTLES DRAWN AEROBIC AND ANAEROBIC Blood Culture adequate volume   Culture   Final    NO GROWTH 1 DAY Performed at [facility], [address] [place], [place] [address]    Report Status PENDING  Incomplete  Culture, blood (routine x 2)     Status: None (Preliminary result)   Collection Time: 07/03/18 12:58 AM   Specimen: BLOOD  Result Value Ref Range Status   Specimen Description BLOOD LEFT HAND  Final   Special Requests   Final    BOTTLES DRAWN AEROBIC AND ANAEROBIC Blood Culture adequate volume   Culture   Final    NO GROWTH 1 DAY Performed at [facility], [address] [place] [address], [place],  [address]    Report Status PENDING  Incomplete    Coagulation Studies: No results for input(s): LABPROT, INR in the last 72 hours.  Urinalysis: No results for input(s): COLORURINE, LABSPEC, PHURINE, GLUCOSEU, HGBUR, BILIRUBINUR, KETONESUR, PROTEINUR, UROBILINOGEN, NITRITE, LEUKOCYTESUR in the last 72 hours.  Invalid input(s): APPERANCEUR    Imaging: Dg Chest Port 1 View  Result Date: 07/03/2018 CLINICAL DATA:  Respiratory failure EXAM: PORTABLE CHEST 1 VIEW COMPARISON:  Portable exam 0434 hours compared to 07/01/2018 FINDINGS: Nasogastric tube extends into stomach. Tip of endotracheal tube projects 6.1 cm above carina. External pacing leads in EKG leads project over chest. Normal heart size, mediastinal contours, and pulmonary vascularity. Persistent consolidation RIGHT lower lobe. Remaining lungs grossly clear. No  definite pleural effusion or pneumothorax. IMPRESSION: Persistent consolidation RIGHT lower lobe. Electronically Signed   By: Lavonia Dana M.D.   On: 07/03/2018 08:07     Medications:   . sodium chloride Stopped (07/04/18 1123)  .  ceFAZolin (ANCEF) IV Stopped (07/04/18 0550)  . dexmedetomidine (PRECEDEX) IV infusion 0.6 mcg/kg/hr (07/04/18 1200)  . fentaNYL infusion INTRAVENOUS Stopped (07/04/18 0956)  . norepinephrine (LEVOPHED) Adult infusion Stopped (07/04/18 0402)  . propofol (DIPRIVAN) infusion Stopped (07/04/18 3300)  . pureflow 2,500 mL/hr at 07/04/18 1145  . vasopressin (PITRESSIN) infusion - *FOR SHOCK* Stopped (07/04/18 0604)   . B-complex with vitamin C  1 tablet Per Tube QHS  . chlorhexidine gluconate (MEDLINE KIT)  15 mL Mouth Rinse BID  . feeding supplement (VITAL HIGH PROTEIN)  1,000 mL Per Tube Q24H  . guaiFENesin  600 mg Oral BID  . heparin injection (subcutaneous)  5,000 Units Subcutaneous Q12H  . hydrocortisone sod succinate (SOLU-CORTEF) inj  50 mg Intravenous Q6H  . insulin aspart  0-15 Units Subcutaneous Q4H  . ipratropium-albuterol  3 mL Nebulization Q4H  . mouth rinse  15 mL Mouth Rinse 10 times per day  . pantoprazole (PROTONIX) IV  40 mg Intravenous Q24H   sodium chloride, acetaminophen **OR** acetaminophen, heparin, ondansetron **OR** ondansetron (ZOFRAN) IV, traZODone  Assessment/ Plan:  48 y.o. male with a PMHx of HIV currently not on treatment, tuberculosis, history of abscess drainage, who was admitted to [facility] on 07/01/2018 for evaluation of altered mental status and unresponsiveness.  Patient admitted with severe right lower lobe pneumonia, acute renal failure, and respiratory failure.  1.  Acute renal failure secondary to acute tubular necrosis.    Continue CRRT at this time, reevaluate for HD going forward..  2.  Septic shock/hypotension.    Off pressors at the moment, monitor BP trend closely.   3.  Metabolic acidosis.    Serum bicarbonate up to  25 now, continue to monitor.   4.  Acute respiratory failure secondary to right lower lobe pneumonia.  Plans for extubation this afternoon noted.    LOS: 3 Rorey Bisson 6/12/20201:57 PM

## 2018-07-04 NOTE — Progress Notes (Signed)
[place] [facility] [place] NAME: Joshua Dougherty    MR#:  [id number]  DATE OF BIRTH:  [birth year]  Patient is still critically ill, intubated, sedated, patient was off pressors this morning but developed hypotension again, restarted the pressors, noted that he is in A. fib with RVR as per nurse note.  CHIEF COMPLAINT:   Chief Complaint  Patient presents with  . Altered Mental Status  . Respiratory Distress  On ventilator, on CRRT.  REVIEW OF SYSTEMS:   Review of Systems  Unable to perform ROS: Intubated    DRUG ALLERGIES:  No Known Allergies  VITALS:  Blood pressure 110/78, pulse 97, temperature (!) 96.3 F (35.7 C), temperature source Axillary, resp. rate 16, height 6' (1.829 m), weight 72.9 kg, SpO2 94 %.  PHYSICAL EXAMINATION:  GENERAL:  48 y.o.-year-old patient lying in the bed critically ill.  Orally intubated. EYES: Pupils equal, round, reactive to light . No scleral icterus.   HEENT: Head atraumatic, normocephalic.  Orally intubated.   NECK:  Supple, no jugular venous distention. No thyroid enlargement, no tenderness.  LUNGS: Coarse breath sounds bilaterally.Marland Kitchen  CARDIOVASCULAR: S1, S2 normal. No murmurs, rubs, or gallops.  ABDOMEN: Soft, nontender, nondistended. Bowel sounds present. No organomegaly or mass.  EXTREMITIES: No pedal edema, cyanosis, or clubbing.  NEUROLOGIC: Intubated, sedated   pSYCHIATRIC sedated SKIN: No obvious rash, lesion, or ulcer.    LABORATORY PANEL:   CBC Recent Labs  Lab 07/04/18 0340  WBC 24.4*  HGB 9.3*  HCT 26.1*  PLT 86*   ------------------------------------------------------------------------------------------------------------------  Chemistries  Recent Labs  Lab 07/01/18 0206  07/04/18 1118  NA 131*   < > 135  K 3.8   < > 4.1  CL 102   < > 103  CO2 12*   < > 25  GLUCOSE 88   < > 139*  BUN QUANTITY NOT SUFFICIENT, UNABLE TO PERFORM TEST   < > 39*  CREATININE QUANTITY NOT  SUFFICIENT, UNABLE TO PERFORM TEST   < > 1.75*  CALCIUM 7.9*   < > 8.0*  MG  --    < > 2.0  AST 83*  --   --   ALT 18  --   --   ALKPHOS 128*  --   --   BILITOT QUANTITY NOT SUFFICIENT, UNABLE TO PERFORM TEST  --   --    < > = values in this interval not displayed.   ------------------------------------------------------------------------------------------------------------------  Cardiac Enzymes Recent Labs  Lab 07/01/18 0431  TROPONINI 0.05*   ------------------------------------------------------------------------------------------------------------------  RADIOLOGY:  Dg Chest Port 1 View  Result Date: 07/03/2018 CLINICAL DATA:  Respiratory failure EXAM: PORTABLE CHEST 1 VIEW COMPARISON:  Portable exam 0434 hours compared to 07/01/2018 FINDINGS: Nasogastric tube extends into stomach. Tip of endotracheal tube projects 6.1 cm above carina. External pacing leads in EKG leads project over chest. Normal heart size, mediastinal contours, and pulmonary vascularity. Persistent consolidation RIGHT lower lobe. Remaining lungs grossly clear. No definite pleural effusion or pneumothorax. IMPRESSION: Persistent consolidation RIGHT lower lobe. Electronically Signed   By: Lavonia Dana M.D.   On: 07/03/2018 08:07    EKG:   Orders placed or performed during the hospital encounter of 07/01/18  . ED EKG  . ED EKG  . EKG 12-Lead  . EKG 12-Lead    ASSESSMENT AND PLAN:   48 year old male with history of AIDS, latent TB admitted for acute respiratory failure and septic shock  Acute respiratory failure  due to septic shock, currently on full vent support, restarted the pressors.  Appreciate intensivist, ID following.  #1 .septic shock secondary to bilateral pneumonia, patient has polymicrobial bacteremia with staph aureus, E. coli, GBS source likely lung.,  Continue meropenem.,  Continue vasopressors repeat blood cultures no growth.  Leukocytosis slightly better than yesterday.  No fever.  2.   Acute respiratory failure, patient is intubated,, sedated.  History of cavitary pneumonia, admitted in February that time post sputum AFB that  were sent, which were negative, patient also had bronc following discharge which were negative for AFB.  3 .  Acute renal failure with ATN secondary to septic shock, ; renal function not improved yet.,  Patient is on CRRT.  #4. HIV, recent CD4 count 403, not on Elnoria Howard as not engaged in the care as per ID documentation. Prognosis guarded, high risk for cardiac arrest.  Condition critical, ICU physician spoke to patient's mom, wife.  All the records are reviewed and case discussed with Care Management/Social Workerr. Management plans discussed with the patient, family and they are in agreement.  CODE STATUS: DNR  TOTAL TIME TAKING CARE OF THIS PATIENT: 38 minutes.   Patient is critical to plan for discharge disposition. Epifanio Lesches M.D on 07/04/2018 at 1:41 PM  Between 7am to 6pm - Pager - [phone]  After 6pm go to [email or web address] - password [email or web address] [facility]  Office  [phone]  CC: Primary care physician; Patient, No Pcp Per   Note: This dictation was prepared with Dragon dictation along with smaller phrase technology. Any transcriptional errors that result from this process are unintentional.

## 2018-07-04 NOTE — Progress Notes (Signed)
Discussed goals of treatment and plan of care with pts spouse Beatriz Stallion and his sister via telephone they stated if the pt were extubated and required reintubation they would like Mr. Freese reintubated.  [name], [place] Pager [phone] (please enter 7 digits) PCCM Consult Pager [phone] (please enter 7 digits)

## 2018-07-04 NOTE — Progress Notes (Signed)
CRRT stopped at 1730 for trip to [place].  Restarted with new cartridge and dialysate bags at 1845

## 2018-07-04 NOTE — Progress Notes (Signed)
Pt became very agitated approx 1015 while off sedation on SBT, managed to remove his OG, ETT not disturbed.  Precedex started, fentanyl restarted at 25 mcgs d/t pt stil sitting up in bed an shaking his head, trying to reach devices.  His BP dropped significantly w/precedex , levophed restarted at 4 mcgs, writing RN noted AFib on monitor, called CCMD to confirm and also ran EKG.  Dr Mortimer Fries informed.  He will reevaluate after he talks to family, pt's pressure may not tolerate amiodarone at this time.  Leave OG out for now as we may still be able extubate pt later this afternoon.  Foley removed d/t 0 UOP

## 2018-07-04 NOTE — Progress Notes (Signed)
CRITICAL CARE NOTE   SYNOPSIS 48 yo male with a PMH of Tuberculosis(dx 02/2018 underwent bronchoscopy for sampling on 03/28/2018 by Dr. Mattie Marlin HIV (currently not on treatment). He presented to [facility] ER on 06/9 via EMS withsevere respiratory distress.  Per ER notes pts family called the sheriff's department to perform a wellness check because they had not heard from the ptin several days. Thefire department found the pt confused and lying supine on the ground withc/odifficulty breathing.  -Pt placed on NRB en route to the ER. Upon arrival to the ER pt transitioned to Bipap due to continued respiratory failure. Lab results revealed Na+ 131, CO2 12, lactic acid 3.6, COVID-19 negative, and vbg pH 7.23/pCO2 35.  -CXR concerning for right lower lobe airspace opacity likely secondary topneumonia and probable small right-sided parapneumonic effusion. -received 2L NS bolus, vancomycin, cefepime, and clindamycin.  -intubation in the emergency room.  - transient arrest of less than a minute where he became pulseless and required epinephrine and atropine. He has been pressor dependent since that episode. Now requiring norepinephrine and vasopressin.  Severe Septic shock from E coli/MSSA/GBS bacteremia from pneumonia and UTI with progressive multiorgan failure/renal failure/resp failure   CC  follow up respiratory failure  SUBJECTIVE Patient remains critically ill Prognosis is guarded Multiple vasopressors Multiorgan failure Severe resp failure Septic shock  Vent Mode: PRVC FiO2 (%):  [50 %] 50 % Set Rate:  [30 bmp] 30 bmp Vt Set:  [500 mL] 500 mL PEEP:  [8 cmH20] 8 cmH20 Plateau Pressure:  [17 cmH20-20 cmH20] 20 cmH20   BP 107/71   Pulse 96   Temp (!) 97 F (36.1 C)   Resp (!) 30   Ht 6' (1.829 m)   Wt 72.9 kg   SpO2 98%   BMI 21.80 kg/m    I/O last 3 completed shifts: In: 3323.1 [I.V.:2540.9; Other:50; NG/GT:390; IV Piggyback:342.3] Out: 151 [Urine:101;  Emesis/NG output:50] No intake/output data recorded.  SpO2: 98 % FiO2 (%): 50 %   SIGNIFICANT EVENTS   REVIEW OF SYSTEMS  PATIENT IS UNABLE TO PROVIDE COMPLETE REVIEW OF SYSTEMS DUE TO SEVERE CRITICAL ILLNESS   PHYSICAL EXAMINATION:  GENERAL:critically ill appearing, +resp distress HEAD: Normocephalic, atraumatic.  EYES: Pupils equal, round, reactive to light.  No scleral icterus.  MOUTH: Moist mucosal membrane. NECK: Supple. No thyromegaly. No nodules. No JVD.  PULMONARY: +rhonchi, +wheezing CARDIOVASCULAR: S1 and S2. Regular rate and rhythm. No murmurs, rubs, or gallops.  GASTROINTESTINAL: Soft, nontender, -distended. No masses. Positive bowel sounds. No hepatosplenomegaly.  MUSCULOSKELETAL: No swelling, clubbing, or edema.  NEUROLOGIC: obtunded, GCS<8 SKIN:intact,warm,dry  MEDICATIONS: I have reviewed all medications and confirmed regimen as documented   CULTURE RESULTS   Recent Results (from the past 240 hour(s))  SARS Coronavirus 2 (CEPHEID- Performed in [place] hospital lab), Hosp Order     Status: None   Collection Time: 07/01/18  1:19 AM   Specimen: Nasopharyngeal Swab  Result Value Ref Range Status   SARS Coronavirus 2 NEGATIVE NEGATIVE Final    Comment: (NOTE) If result is NEGATIVE SARS-CoV-2 target nucleic acids are NOT DETECTED. The SARS-CoV-2 RNA is generally detectable in upper and lower  respiratory specimens during the acute phase of infection. The lowest  concentration of SARS-CoV-2 viral copies this assay can detect is 250  copies / mL. A negative result does not preclude SARS-CoV-2 infection  and should not be used as the sole basis for treatment or other  patient management decisions.  A negative result may occur  with  improper specimen collection / handling, submission of specimen other  than nasopharyngeal swab, presence of viral mutation(s) within the  areas targeted by this assay, and inadequate number of viral copies  (<250 copies / mL).  A negative result must be combined with clinical  observations, patient history, and epidemiological information. If result is POSITIVE SARS-CoV-2 target nucleic acids are DETECTED. The SARS-CoV-2 RNA is generally detectable in upper and lower  respiratory specimens dur ing the acute phase of infection.  Positive  results are indicative of active infection with SARS-CoV-2.  Clinical  correlation with patient history and other diagnostic information is  necessary to determine patient infection status.  Positive results do  not rule out bacterial infection or co-infection with other viruses. If result is PRESUMPTIVE POSTIVE SARS-CoV-2 nucleic acids MAY BE PRESENT.   A presumptive positive result was obtained on the submitted specimen  and confirmed on repeat testing.  While 2019 novel coronavirus  (SARS-CoV-2) nucleic acids may be present in the submitted sample  additional confirmatory testing may be necessary for epidemiological  and / or clinical management purposes  to differentiate between  SARS-CoV-2 and other Sarbecovirus currently known to infect humans.  If clinically indicated additional testing with an alternate test  methodology [phone]) is advised. The SARS-CoV-2 RNA is generally  detectable in upper and lower respiratory sp ecimens during the acute  phase of infection. The expected result is Negative. Fact Sheet for Patients:  StrictlyIdeas.no Fact Sheet for Healthcare Providers: BankingDealers.co.za This test is not yet approved or cleared by the [place] FDA and has been authorized for detection and/or diagnosis of SARS-CoV-2 by FDA under an Emergency Use Authorization (EUA).  This EUA will remain in effect (meaning this test can be used) for the duration of the COVID-19 declaration under Section 564(b)(1) of the Act, 21 U.S.C. section 360bbb-3(b)(1), unless the authorization is terminated or revoked  sooner. Performed at [facility], [address] [place], [place] [address]   Blood culture (routine x 2)     Status: Abnormal   Collection Time: 07/01/18  1:19 AM   Specimen: BLOOD  Result Value Ref Range Status   Specimen Description   Final    BLOOD LEFT ANTECUBITAL Performed at [facility], [place] [address], [place], [place] [address]    Special Requests   Final    BOTTLES DRAWN AEROBIC AND ANAEROBIC Blood Culture results may not be optimal due to an excessive volume of blood received in culture bottles Performed at [facility], [address] [place], [place] [address]    Culture  Setup Time   Final    GRAM NEGATIVE RODS IN BOTH AEROBIC AND ANAEROBIC BOTTLES CRITICAL RESULT CALLED TO, READ BACK BY AND VERIFIED WITH: CHRISTINE KATSOUDAS AT 4818 07/01/18 SG Performed at [facility], [address] [place], [place] [address]    Culture ESCHERICHIA COLI (A)  Final   Report Status 07/03/2018 FINAL  Final   Organism ID, Bacteria ESCHERICHIA COLI  Final      Susceptibility   Escherichia coli - MIC*    AMPICILLIN 4 SENSITIVE Sensitive     CEFAZOLIN <=4 SENSITIVE Sensitive     CEFEPIME <=1 SENSITIVE Sensitive     CEFTAZIDIME <=1 SENSITIVE Sensitive     CEFTRIAXONE <=1 SENSITIVE Sensitive     CIPROFLOXACIN <=0.25 SENSITIVE Sensitive     GENTAMICIN <=1 SENSITIVE Sensitive     IMIPENEM <=0.25 SENSITIVE Sensitive     TRIMETH/SULFA <=20 SENSITIVE  Sensitive     AMPICILLIN/SULBACTAM <=2 SENSITIVE Sensitive     PIP/TAZO <=4 SENSITIVE Sensitive     Extended ESBL NEGATIVE Sensitive     * ESCHERICHIA COLI  Blood culture (routine x 2)     Status: Abnormal (Preliminary result)   Collection Time: 07/01/18  3:09 AM   Specimen: BLOOD  Result Value Ref Range Status   Specimen Description   Final    BLOOD LEFT ARM Performed at [facility], [address], [place], [place] [address]    Special Requests   Final    BOTTLES DRAWN AEROBIC AND  ANAEROBIC Blood Culture adequate volume Performed at [address] [place], [place] [address]    Culture  Setup Time   Final    GRAM POSITIVE COCCI IN BOTH AEROBIC AND ANAEROBIC BOTTLES GRAM NEGATIVE RODS IN BOTH AEROBIC AND ANAEROBIC BOTTLES CRITICAL RESULT CALLED TO, READ BACK BY AND VERIFIED WITH: CHRISTINE KATSOUDAS AT 5638 07/01/18 SG    Culture (A)  Final    ESCHERICHIA COLI ORGANISM 1 SUSCEPTIBILITIES PERFORMED ON PREVIOUS CULTURE WITHIN THE LAST 5 DAYS. STAPHYLOCOCCUS AUREUS GRAM POSITIVE COCCI IDENTIFICATION TO FOLLOW GROUP B STREP(S.AGALACTIAE)ISOLATED SUSCEPTIBILITIES TO FOLLOW Performed at [facility], [place] [address] [place], [place] [address]    Report Status PENDING  Incomplete  Blood Culture ID Panel (Reflexed)     Status: Abnormal   Collection Time: 07/01/18  3:09 AM  Result Value Ref Range Status   Enterococcus species NOT DETECTED NOT DETECTED Final   Listeria monocytogenes NOT DETECTED NOT DETECTED Final   Staphylococcus species DETECTED (A) NOT DETECTED Final    Comment: CRITICAL RESULT CALLED TO, READ BACK BY AND VERIFIED WITH: CHRISTINE KATSOUDAS AT 2876 07/01/18 SG    Staphylococcus aureus (BCID) DETECTED (A) NOT DETECTED Final    Comment: Methicillin (oxacillin) susceptible Staphylococcus aureus (MSSA). Preferred therapy is anti staphylococcal beta lactam antibiotic (Cefazolin or Nafcillin), unless clinically contraindicated. CRITICAL RESULT CALLED TO, READ BACK BY AND VERIFIED WITH: CHRISTINE KATSOUDAS AT 8115 07/01/18 SG    Methicillin resistance NOT DETECTED NOT DETECTED Final   Streptococcus species DETECTED (A) NOT DETECTED Final    Comment: CRITICAL RESULT CALLED TO, READ BACK BY AND VERIFIED WITH: CHRISTINE KATSOUDAS AT 1325 07/01/18 SG    Streptococcus agalactiae DETECTED (A) NOT DETECTED Final    Comment: CRITICAL RESULT CALLED TO, READ BACK BY AND VERIFIED WITH: CHRISTINE KATSOUDAS AT 7262 07/01/18 SG    Streptococcus  pneumoniae NOT DETECTED NOT DETECTED Final   Streptococcus pyogenes NOT DETECTED NOT DETECTED Final   Acinetobacter baumannii NOT DETECTED NOT DETECTED Final   Enterobacteriaceae species DETECTED (A) NOT DETECTED Final    Comment: Enterobacteriaceae represent a large family of gram-negative bacteria, not a single organism. CRITICAL RESULT CALLED TO, READ BACK BY AND VERIFIED WITH: CHRISTINE KATSOUDAS AT 0355 07/01/18 SG    Enterobacter cloacae complex NOT DETECTED NOT DETECTED Final   Escherichia coli DETECTED (A) NOT DETECTED Final    Comment: CRITICAL RESULT CALLED TO, READ BACK BY AND VERIFIED WITH: CHRISTINE KATSOUDAS AT 9741 07/01/18 SG    Klebsiella oxytoca NOT DETECTED NOT DETECTED Final   Klebsiella pneumoniae NOT DETECTED NOT DETECTED Final   Proteus species NOT DETECTED NOT DETECTED Final   Serratia marcescens NOT DETECTED NOT DETECTED Final   Carbapenem resistance NOT DETECTED NOT DETECTED Final   Haemophilus influenzae NOT DETECTED NOT DETECTED Final   Neisseria meningitidis NOT DETECTED NOT DETECTED Final   Pseudomonas aeruginosa NOT  DETECTED NOT DETECTED Final   Candida albicans NOT DETECTED NOT DETECTED Final   Candida glabrata NOT DETECTED NOT DETECTED Final   Candida krusei NOT DETECTED NOT DETECTED Final   Candida parapsilosis NOT DETECTED NOT DETECTED Final   Candida tropicalis NOT DETECTED NOT DETECTED Final    Comment: Performed at [facility], [address] [place], [place] [address]  MRSA PCR Screening     Status: None   Collection Time: 07/01/18  9:53 AM   Specimen: Nasopharyngeal  Result Value Ref Range Status   MRSA by PCR NEGATIVE NEGATIVE Final    Comment:        The GeneXpert MRSA Assay (FDA approved for NASAL specimens only), is one component of a comprehensive MRSA colonization surveillance program. It is not intended to diagnose MRSA infection nor to guide or monitor treatment for MRSA infections. Performed at [facility],  [address] [place], [place] [address]   Culture, respiratory (non-expectorated)     Status: None (Preliminary result)   Collection Time: 07/02/18  6:48 PM   Specimen: Tracheal Aspirate; Respiratory  Result Value Ref Range Status   Specimen Description   Final    TRACHEAL ASPIRATE Performed at [facility], [address] [place], [place] [address]    Special Requests   Final    NONE Performed at [facility], [address] [place], [place] [address]    Gram Stain   Final    RARE WBC PRESENT, PREDOMINANTLY PMN NO ORGANISMS SEEN    Culture   Final    NO GROWTH < 12 HOURS Performed at [place] [address] [place], [place] [address]    Report Status PENDING  Incomplete  Culture, blood (routine x 2)     Status: None (Preliminary result)   Collection Time: 07/03/18 12:51 AM   Specimen: BLOOD  Result Value Ref Range Status   Specimen Description BLOOD RIGHT HAND  Final   Special Requests   Final    BOTTLES DRAWN AEROBIC AND ANAEROBIC Blood Culture adequate volume   Culture   Final    NO GROWTH 1 DAY Performed at [facility], [address] [place], [place] [address]    Report Status PENDING  Incomplete  Culture, blood (routine x 2)     Status: None (Preliminary result)   Collection Time: 07/03/18 12:58 AM   Specimen: BLOOD  Result Value Ref Range Status   Specimen Description BLOOD LEFT HAND  Final   Special Requests   Final    BOTTLES DRAWN AEROBIC AND ANAEROBIC Blood Culture adequate volume   Culture   Final    NO GROWTH 1 DAY Performed at [facility], [address] [place], [place] [address]    Report Status PENDING  Incomplete       CULTURES     Indwelling Urinary Catheter continued, requirement due to   Reason to continue Indwelling Urinary Catheter strict Intake/Output monitoring for hemodynamic instability   Central Line/ continued, requirement due to  Reason to continue [place] of central venous pressure or other hemodynamic parameters and poor IV access   Ventilator continued, requirement due to severe respiratory failure   Ventilator Sedation RASS 0 to -2      ASSESSMENT AND PLAN SYNOPSIS   Severe ACUTE Hypoxic and Hypercapnic Respiratory Failure -continue Full MV support -continue Bronchodilator Therapy -Wean Fio2 and PEEP as tolerated -will perform SAT/SBT when respiratory parameters are met  ACUTE KIDNEY INJURY/Renal Failure -follow chem 7 -follow UO -continue Foley Catheter-assess need -Avoid nephrotoxic agents -Recheck creatinine  Continue CRRT   NEUROLOGY - intubated and sedated - minimal sedation to achieve a RASS goal: -1 Wake up assessment pending   SHOCK-SEPSIS/HYPOVOLUMIC -use vasopressors to keep MAP>65 -follow ABG and LA -stress dose steroids -aggressive IV fluid resuscitation  CARDIAC ICU monitoring  ID -continue IV abx as prescibed -follow up cultures E coli Bacteremia MSSA bacteremia GBS bacteremia  GI GI PROPHYLAXIS as indicated  NUTRITIONAL STATUS DIET-->TF's as tolerated Constipation protocol as indicated  ENDO - will use ICU hypoglycemic\Hyperglycemia protocol if indicated   ELECTROLYTES -follow labs as needed -replace as needed -pharmacy consultation and following   DVT/GI PRX ordered TRANSFUSIONS AS NEEDED MONITOR FSBS ASSESS the need for LABS as needed   Critical Care Time devoted to patient care services described in this note is 35 minutes.   Overall, patient is critically ill, prognosis is guarded.  Patient with Multiorgan failure and at high risk for cardiac arrest and death.   Patient is DNR   Corrin Parker, M.D.  [facility]  Medical Director [place] Director [facility] Cardio-Pulmonary Department

## 2018-07-04 NOTE — Progress Notes (Signed)
Date of Admission:  07/01/2018    Remains intubated Went into Afib today  Medications:  . B-complex with vitamin C  1 tablet Per Tube QHS  . chlorhexidine gluconate (MEDLINE KIT)  15 mL Mouth Rinse BID  . feeding supplement (VITAL HIGH PROTEIN)  1,000 mL Per Tube Q24H  . guaiFENesin  600 mg Oral BID  . heparin injection (subcutaneous)  5,000 Units Subcutaneous Q12H  . hydrocortisone sod succinate (SOLU-CORTEF) inj  50 mg Intravenous Q6H  . insulin aspart  0-15 Units Subcutaneous Q4H  . ipratropium-albuterol  3 mL Nebulization Q4H  . mouth rinse  15 mL Mouth Rinse 10 times per day  . pantoprazole (PROTONIX) IV  40 mg Intravenous Q24H    Objective: Vital signs in last 24 hours: Temp:  [96.4 F (35.8 C)-98.8 F (37.1 C)] 96.4 F (35.8 C) (06/12 0924) Pulse Rate:  [96-116] 97 (06/12 0924) Resp:  [16-30] 16 (06/12 0924) BP: (88-110)/(62-83) 110/78 (06/12 0800) SpO2:  [89 %-100 %] 97 % (06/12 0953) FiO2 (%):  [40 %-50 %] 40 % (06/12 0953) Weight:  [72.9 kg] 72.9 kg (06/12 0251)  PHYSICAL EXAM:  Intubated , on pressor,  decreased air entry rt side of the lung Irregular HR  Lab Results Recent Labs    07/03/18 0345  07/04/18 0335 07/04/18 0340 07/04/18 0739  WBC 26.2*  --   --  24.4*  --   HGB 11.4*  --   --  9.3*  --   HCT 31.3*  --   --  26.1*  --   NA 136   < > 135  --  135  K 3.7   < > 3.8  --  4.0  CL 100   < > 105  --  102  CO2 25   < > 25  --  25  BUN 67*   < > 42*  --  41*  CREATININE 3.48*   < > 1.91*  --  1.77*   < > = values in this interval not displayed.   Liver Panel Recent Labs    07/04/18 0335 07/04/18 0739  ALBUMIN 1.5* 1.6*   Sedimentation Rate No results for input(s): ESRSEDRATE in the last 72 hours. C-Reactive Protein No results for input(s): CRP in the last 72 hours.  Microbiology:  Studies/Results:   Dg Chest Port 1 View  Result Date: 07/03/2018 CLINICAL DATA:  Respiratory failure EXAM: PORTABLE CHEST 1 VIEW COMPARISON:  Portable  exam 0434 hours compared to 07/01/2018 FINDINGS: Nasogastric tube extends into stomach. Tip of endotracheal tube projects 6.1 cm above carina. External pacing leads in EKG leads project over chest. Normal heart size, mediastinal contours, and pulmonary vascularity. Persistent consolidation RIGHT lower lobe. Remaining lungs grossly clear. No definite pleural effusion or pneumothorax. IMPRESSION: Persistent consolidation RIGHT lower lobe. Electronically Signed   By: Lavonia Dana M.D.   On: 07/03/2018 08:07     Assessment/Plan: Polymicrobial bacteremia- staph aureus, E.coli and GBS bacteremia- ??source ? Lung Will need TEE Will need repeat culture Pt currently on cefazolin as all three organisms susceptible to it Will need CT chest and abdomen as the source of bacteremia unclear and also he has leucocytosis  Acute hypoxic respiratory failure intubated- recommend bronch/ culture  Rt lower lobe pneumonia in a patient with cavitary lesion- Investigated for TB in march with 3 sputum and one bronch specimen- all negative including NAAT  Acute renal failure- Due to ATN from sepsison CRRT, anuric ? HIV- cd4 is 403,  VL 29K in Feb 2020- not on HAART as not engaged in care- repeat ;abs cd4 is 45- will send beta d glucan- once able to take PO can start treatment  Discussed with Dr.KAsa ID will follow peripherally, call if needed

## 2018-07-04 NOTE — Progress Notes (Signed)
Pt was transported to CT from CCU and back while on the vent. 

## 2018-07-05 LAB — CBC WITH DIFFERENTIAL/PLATELET
Abs Immature Granulocytes: 1.12 10*3/uL — ABNORMAL HIGH (ref 0.00–0.07)
Basophils Absolute: 0 10*3/uL (ref 0.0–0.1)
Basophils Relative: 0 %
Eosinophils Absolute: 0 10*3/uL (ref 0.0–0.5)
Eosinophils Relative: 0 %
HCT: 30.3 % — ABNORMAL LOW (ref 39.0–52.0)
Hemoglobin: 10.7 g/dL — ABNORMAL LOW (ref 13.0–17.0)
Immature Granulocytes: 5 %
Lymphocytes Relative: 4 %
Lymphs Abs: 0.8 10*3/uL (ref 0.7–4.0)
MCH: 33.4 pg (ref 26.0–34.0)
MCHC: 35.3 g/dL (ref 30.0–36.0)
MCV: 94.7 fL (ref 80.0–100.0)
Monocytes Absolute: 1.1 10*3/uL — ABNORMAL HIGH (ref 0.1–1.0)
Monocytes Relative: 5 %
Neutro Abs: 18.8 10*3/uL — ABNORMAL HIGH (ref 1.7–7.7)
Neutrophils Relative %: 86 %
Platelets: 100 10*3/uL — ABNORMAL LOW (ref 150–400)
RBC: 3.2 MIL/uL — ABNORMAL LOW (ref 4.22–5.81)
RDW: 15.4 % (ref 11.5–15.5)
WBC: 21.8 10*3/uL — ABNORMAL HIGH (ref 4.0–10.5)
nRBC: 0.1 % (ref 0.0–0.2)

## 2018-07-05 LAB — RENAL FUNCTION PANEL
Albumin: 1.6 g/dL — ABNORMAL LOW (ref 3.5–5.0)
Albumin: 1.6 g/dL — ABNORMAL LOW (ref 3.5–5.0)
Albumin: 1.6 g/dL — ABNORMAL LOW (ref 3.5–5.0)
Anion gap: 6 (ref 5–15)
Anion gap: 7 (ref 5–15)
Anion gap: 8 (ref 5–15)
BUN: 29 mg/dL — ABNORMAL HIGH (ref 6–20)
BUN: 31 mg/dL — ABNORMAL HIGH (ref 6–20)
BUN: 32 mg/dL — ABNORMAL HIGH (ref 6–20)
CO2: 24 mmol/L (ref 22–32)
CO2: 25 mmol/L (ref 22–32)
CO2: 26 mmol/L (ref 22–32)
Calcium: 7.9 mg/dL — ABNORMAL LOW (ref 8.9–10.3)
Calcium: 8.1 mg/dL — ABNORMAL LOW (ref 8.9–10.3)
Calcium: 8.1 mg/dL — ABNORMAL LOW (ref 8.9–10.3)
Chloride: 102 mmol/L (ref 98–111)
Chloride: 103 mmol/L (ref 98–111)
Chloride: 103 mmol/L (ref 98–111)
Creatinine, Ser: 1.27 mg/dL — ABNORMAL HIGH (ref 0.61–1.24)
Creatinine, Ser: 1.35 mg/dL — ABNORMAL HIGH (ref 0.61–1.24)
Creatinine, Ser: 1.51 mg/dL — ABNORMAL HIGH (ref 0.61–1.24)
GFR calc Af Amer: >60 mL/min (ref >60)
GFR calc Af Amer: >60 mL/min (ref >60)
GFR calc Af Amer: >60 mL/min (ref >60)
GFR calc non Af Amer: 54 mL/min — ABNORMAL LOW (ref >60)
GFR calc non Af Amer: >60 mL/min (ref >60)
GFR calc non Af Amer: >60 mL/min (ref >60)
Glucose, Bld: 122 mg/dL — ABNORMAL HIGH (ref 70–99)
Glucose, Bld: 124 mg/dL — ABNORMAL HIGH (ref 70–99)
Glucose, Bld: 129 mg/dL — ABNORMAL HIGH (ref 70–99)
Phosphorus: 2.4 mg/dL — ABNORMAL LOW (ref 2.5–4.6)
Phosphorus: 2.5 mg/dL (ref 2.5–4.6)
Phosphorus: 2.7 mg/dL (ref 2.5–4.6)
Potassium: 4.4 mmol/L (ref 3.5–5.1)
Potassium: 4.4 mmol/L (ref 3.5–5.1)
Potassium: 4.5 mmol/L (ref 3.5–5.1)
Sodium: 134 mmol/L — ABNORMAL LOW (ref 135–145)
Sodium: 135 mmol/L (ref 135–145)
Sodium: 135 mmol/L (ref 135–145)

## 2018-07-05 LAB — MAGNESIUM
Magnesium: 1.9 mg/dL (ref 1.7–2.4)
Magnesium: 1.9 mg/dL (ref 1.7–2.4)
Magnesium: 2 mg/dL (ref 1.7–2.4)

## 2018-07-05 LAB — CULTURE, RESPIRATORY W GRAM STAIN: Culture: NORMAL

## 2018-07-05 LAB — GLUCOSE, CAPILLARY
Glucose-Capillary: 109 mg/dL — ABNORMAL HIGH (ref 70–99)
Glucose-Capillary: 115 mg/dL — ABNORMAL HIGH (ref 70–99)
Glucose-Capillary: 116 mg/dL — ABNORMAL HIGH (ref 70–99)
Glucose-Capillary: 118 mg/dL — ABNORMAL HIGH (ref 70–99)
Glucose-Capillary: 121 mg/dL — ABNORMAL HIGH (ref 70–99)
Glucose-Capillary: 122 mg/dL — ABNORMAL HIGH (ref 70–99)
Glucose-Capillary: 142 mg/dL — ABNORMAL HIGH (ref 70–99)

## 2018-07-05 LAB — LACTIC ACID, PLASMA: Lactic Acid, Venous: 2.3 mmol/L (ref 0.5–1.9)

## 2018-07-05 MED ORDER — MIDAZOLAM HCL 2 MG/2ML IJ SOLN
2.0000 mg | Freq: Once | INTRAMUSCULAR | Status: AC
  Administered 2018-07-05: 2 mg via INTRAVENOUS

## 2018-07-05 MED ORDER — CHLORHEXIDINE GLUCONATE CLOTH 2 % EX PADS
6.0000 | MEDICATED_PAD | Freq: Every day | CUTANEOUS | Status: DC
  Administered 2018-07-06 – 2018-07-08 (×3): 6 via TOPICAL

## 2018-07-05 MED ORDER — ALTEPLASE 2 MG IJ SOLR: 2.0000 mg | Freq: Once | INTRAMUSCULAR | Status: DC | PRN

## 2018-07-05 MED ORDER — FENTANYL BOLUS VIA INFUSION
50.0000 ug | Freq: Once | INTRAVENOUS | Status: AC
  Administered 2018-07-05: 50 ug via INTRAVENOUS

## 2018-07-05 MED ORDER — MIDAZOLAM HCL 2 MG/2ML IJ SOLN
INTRAMUSCULAR | Status: AC
  Administered 2018-07-05: 20:00:00 2 mg via INTRAVENOUS
  Filled 2018-07-05: qty 2

## 2018-07-05 MED ORDER — SODIUM CHLORIDE 0.9% FLUSH: 10.0000 mL | INTRAVENOUS | Status: DC | PRN

## 2018-07-05 MED ORDER — HEPARIN SODIUM (PORCINE) 1000 UNIT/ML DIALYSIS
1000.0000 [IU] | INTRAMUSCULAR | Status: DC | PRN
  Filled 2018-07-05: qty 1

## 2018-07-05 MED ORDER — SODIUM CHLORIDE 0.9% FLUSH
10.0000 mL | Freq: Two times a day (BID) | INTRAVENOUS | Status: DC
  Administered 2018-07-05: 22:00:00 10 mL
  Administered 2018-07-05: 40 mL
  Administered 2018-07-06: 10 mL
  Administered 2018-07-07: 20 mL
  Administered 2018-07-07: 10 mL
  Administered 2018-07-08: 20 mL
  Administered 2018-07-09 – 2018-07-10 (×3): 10 mL

## 2018-07-05 NOTE — Progress Notes (Signed)
HD Tx Start  Patient's wife and sister updated via telephone regarding transition from CRRT to HD, as well as update on how patient is tolerating HD today. VSS, patient stable at tx initiation. Family voices understanding regarding tx change and HD therapy.     07/05/18 1530  Vital Signs  Pulse Rate 86  Resp (!) 31  BP 123/85  Oxygen Therapy  SpO2 90 %  End Tidal CO2 (EtCO2) 36  During Hemodialysis Assessment  Blood Flow Rate (mL/min) 400 mL/min  Arterial Pressure (mmHg) -180 mmHg  Venous Pressure (mmHg) 160 mmHg  Transmembrane Pressure (mmHg) 40 mmHg  Ultrafiltration Rate (mL/min) 670 mL/min  Dialysate Flow Rate (mL/min) 800 ml/min  Conductivity: Machine  13.8  HD Safety Checks Performed Yes  Dialysis Fluid Bolus Normal Saline  Bolus Amount (mL) 250 mL  Intra-Hemodialysis Comments Tx initiated  Hemodialysis Catheter Left Femoral vein Triple lumen Temporary  Placement Date/Time: 07/02/18 1633   Placed prior to admission: No  Person Inserting Catheter: Hinton Dyer, NP  Orientation: Left  Access Location: Femoral vein  Hemodialysis Catheter Type: Triple lumen Temporary  Site Condition No complications  Blue Lumen Status Infusing  Red Lumen Status Infusing

## 2018-07-05 NOTE — Progress Notes (Signed)
Pre HD Tx Assessment    07/05/18 1525  Neurological  Level of Consciousness Responds to Voice  Orientation Level Intubated/Tracheostomy - Unable to assess  Respiratory  Respiratory Pattern Regular  Chest Assessment Chest expansion symmetrical  Bilateral Breath Sounds Diminished  Airway 7.5 mm  Placement Date/Time: 07/01/18 (c) 9794   Difficult airway due to:: Difficulty was unanticipated  Laryngoscope Blade: MAC;4  Size (mm): 7.5 mm  Cuffed: Cuffed  Insertion attempts: 1  Airway Equipment: Video Laryngoscope  Placement Confirmation: Bilater...  Measured From Lips  Secured By [employer]  Cardiac  Pulse Regular  Heart Sounds S1, S2  Jugular Venous Distention (JVD) No  Cardiac Rhythm NSR  Antiarrhythmic device No  Integumentary  Integumentary (WDL) WDL (I did not find any skin tears - foam on buttocks)  Musculoskeletal  Musculoskeletal (WDL) X  Generalized Weakness Yes  Gastrointestinal  Bowel Sounds Assessment Hypoactive  GU Assessment  Genitourinary (WDL) X  Genitourinary Symptoms Anuria  Psychosocial  Psychosocial (WDL) X  Patient Behaviors Not interactive  Emotional support given Given to patient;Given to patient's family (spoke with wife on phone regarding HD Parc)

## 2018-07-05 NOTE — Progress Notes (Signed)
[place] [facility] [place] NAME: Joshua Dougherty    MR#:  [id number]  DATE OF BIRTH:  [birth year]  SUBJECTIVE:  CHIEF COMPLAINT:   Chief Complaint  Patient presents with   Altered Mental Status   Respiratory Distress   No new complaints reported by nursing staff.  Patient remains sedated on the vent. REVIEW OF SYSTEMS:  ROS Unobtainable due to patient being on the vent. DRUG ALLERGIES:  No Known Allergies VITALS:  Blood pressure 109/83, pulse 81, temperature (!) 96.4 F (35.8 C), temperature source Axillary, resp. rate (!) 21, height 6' (1.829 m), weight 72.7 kg, SpO2 97 %. PHYSICAL EXAMINATION:  Physical Exam  GENERAL:  48 y.o.-year-old patient lying in the bed critically ill.    Sedated on the vent  EYES: Pupils equal, round, reactive to light . No scleral icterus.   HEENT: Head atraumatic, normocephalic.  Orally intubated.   NECK:  Supple, no jugular venous distention. No thyroid enlargement, no tenderness.  LUNGS: Coarse breath sounds bilaterally.Marland Kitchen  CARDIOVASCULAR: S1, S2 normal. No murmurs, rubs, or gallops.  ABDOMEN: Soft, nontender, nondistended. Bowel sounds present. No organomegaly or mass.  EXTREMITIES: No pedal edema, cyanosis, or clubbing.  NEUROLOGIC: Intubated, sedated   pSYCHIATRIC sedated SKIN: No obvious rash, lesion, or ulcer.   LABORATORY PANEL:  Male CBC Recent Labs  Lab 07/05/18 0447  WBC 21.8*  HGB 10.7*  HCT 30.3*  PLT 100*   ------------------------------------------------------------------------------------------------------------------ Chemistries  Recent Labs  Lab 07/01/18 0206  07/05/18 0746  NA 131*   < > 135  K 3.8   < > 4.5  CL 102   < > 103  CO2 12*   < > 26  GLUCOSE 88   < > 122*  BUN QUANTITY NOT SUFFICIENT, UNABLE TO PERFORM TEST   < > 31*  CREATININE QUANTITY NOT SUFFICIENT, UNABLE TO PERFORM TEST   < > 1.35*  CALCIUM 7.9*   < > 8.1*  MG  --    < > 1.9  AST 83*  --   --   ALT 18   --   --   ALKPHOS 128*  --   --   BILITOT QUANTITY NOT SUFFICIENT, UNABLE TO PERFORM TEST  --   --    < > = values in this interval not displayed.   RADIOLOGY:  Ct Chest Wo Contrast  Result Date: 07/04/2018 CLINICAL DATA:  48 year old male with a past medical history of tuberculosis and HIV. The patient currently has severe respiratory distress and findings concerning for right lower lobe pneumonia. EXAM: CT CHEST WITHOUT CONTRAST TECHNIQUE: Multidetector CT imaging of the chest was performed following the standard protocol without IV contrast. COMPARISON:  CT chest dated 03/18/2018 FINDINGS: Cardiovascular: The heart is enlarged. There are mild coronary artery calcifications. There is no significant pericardial effusion. Mediastinum/Nodes: There are mildly prominent mediastinal and hilar lymph nodes. There are some prominent but subcentimeter axillary and supraclavicular lymph nodes. The thyroid gland is unremarkable. The esophagus is unremarkable. Lungs/Pleura: Emphysematous changes are noted bilaterally. Endotracheal tube terminates above the carina. There is debris within the right mainstem bronchus. There is a large area of consolidation involving virtually all of the right lower lobe. There are additional scattered areas of consolidation involving all remaining lobes. There is no pneumothorax. There is no significant pleural effusion. The previously demonstrated cavitary area within the right upper lobe has improved. Upper Abdomen: No acute abnormality. Musculoskeletal: No chest wall mass or suspicious bone  lesions identified. IMPRESSION: 1. There is consolidation involving nearly all of the right lower lobe. There are additional smaller areas of consolidation involving all remaining lobes. Findings are consistent with multifocal pneumonia. 2. Interval decrease in size of the previously demonstrated cavitary lesion in the right upper lobe. 3. No pneumothorax or significant pleural effusion. 4. Mild  cardiomegaly. 5. Emphysema. Emphysema (ICD10-J43.9). Electronically Signed   By: Constance Holster M.D.   On: 07/04/2018 19:21   ASSESSMENT AND PLAN:   48 year old male with history of AIDS, latent TB admitted for acute respiratory failure and septic shock  Acute respiratory failure due to septic shock, currently on full vent support, restarted the pressors.  Appreciate intensivist, ID following.  #1 .septic shock secondary to bilateral pneumonia, patient has polymicrobial bacteremia with staph aureus, E. coli, GBS source likely lung.,    Patient currently on Ancef.  Continue vasopressors repeat blood cultures no growth.  Leukocytosis gradually improving.  No fever.  2.  Acute respiratory failure, patient is intubated,, sedated.  History of cavitary pneumonia, admitted in February that time post sputum AFB that  were sent, which were negative, patient also had bronc following discharge which were negative for AFB.  3 .  Acute renal failure with ATN secondary to septic shock, ; renal function not improved yet.,  Patient was initially placed on CRRT by nephrology.  This was discontinued today.  Nephrology planning on initiation of hemodialysis today.  #4. HIV, recent CD4 count 403, not on Elnoria Howard as not engaged in the care as per ID documentation. Prognosis guarded, high risk for cardiac arrest.  Condition critical, ICU physician spoke to patient's mom, wife.  DVT prophylaxis; heparin  All the records are reviewed and case discussed with Care Management/Social Worker. Management plans discussed with the patient, family and they are in agreement.  CODE STATUS: DNR  TOTAL TIME TAKING CARE OF THIS PATIENT: 38 minutes.   More than 50% of the time was spent in counseling/coordination of care: YES  POSSIBLE D/C IN 3 DAYS, DEPENDING ON CLINICAL CONDITION.   Cheryle Dark M.D on 07/05/2018 at 3:15 PM  Between 7am to 6pm - Pager - [phone]  After 6pm go to [email or web address] - [employer]  [facility]  Office  [phone]  CC: Primary care physician; Patient, No Pcp Per  Note: This dictation was prepared with Dragon dictation along with smaller phrase technology. Any transcriptional errors that result from this process are unintentional.

## 2018-07-05 NOTE — Progress Notes (Signed)
Pre HD Tx   07/05/18 1525  Hand-Off documentation  Report given to (Full Name) Trellis Paganini RN  Report received from (Full Name) Lorn Junes RN  Vital Signs  Temp 97.9 F (36.6 C)  Temp Source Axillary  Pulse Rate 85  Pulse Rate Source Monitor  Resp (!) 30  BP 90/65  BP Location Right Arm  BP Method Automatic  Patient Position (if appropriate) Lying  Oxygen Therapy  SpO2 (!) 89 %  O2 Device Ventilator  End Tidal CO2 (EtCO2) 30  Pain Assessment  Pain Scale CPOT  Critical Care Pain Observation Tool (CPOT)  Facial Expression 0  Body Movements 0  Muscle Tension 0  Compliance with ventilator (intubated pts.) 0  Vocalization (extubated pts.) N/A  CPOT Total 0  Dialysis Weight  Weight 72.7 kg  Type of Weight Pre-Dialysis  Time-Out for Hemodialysis  What Procedure? Hemodialysis  Pt Identifiers(min of two) First/Last Name;MRN/Account#  Correct Site? Yes  Correct Side? Yes  Correct Procedure? Yes  Consents Verified? Yes  Rad Studies Available? N/A  Safety Precautions Reviewed? Yes  Engineer, civil (consulting) Number 2  Station Number  (ICU 20)  UF/Alarm Test Passed  Conductivity: Meter 14  Conductivity: Machine  14  pH 7  Reverse Osmosis WRO1  Normal Saline Lot Number [id number]  Dialyzer Lot Number [id number]  Disposable Set Lot Number [id number]  Machine Temperature 98.6 F (37 C)  Musician and Audible Yes  Blood Lines Intact and Secured Yes  Pre Treatment Patient Checks  Vascular access used during treatment Catheter  Patient is receiving dialysis in a chair Yes  Hepatitis B Surface Antigen Results  Tomasita Crumble)  Hemodialysis Consent Verified Yes  Hemodialysis Standing Orders Initiated Yes  ECG (Telemetry) Monitor On Yes  Prime Ordered Normal Saline  Length of  DialysisTreatment -hour(s) 3 Hour(s)  Dialysis Treatment Comments Na 140  Dialyzer Elisio 17H NR  Dialysate 3K, 2.5 Ca  Dialysate Flow Ordered 800  Blood Flow Rate Ordered 400 mL/min  Ultrafiltration  Goal 1.5 Liters  Pre Treatment Labs Hepatitis B Surface Antigen  Dialysis Blood Pressure Support Ordered Normal Saline  Education / Care Plan  Dialysis Education Provided No (Comment) (education provided to family (via phone conversation) )  Hemodialysis Catheter Left Femoral vein Triple lumen Temporary  Placement Date/Time: 07/02/18 1633   Placed prior to admission: No  Person Inserting Catheter: Hinton Dyer, NP  Orientation: Left  Access Location: Femoral vein  Hemodialysis Catheter Type: Triple lumen Temporary  Site Condition No complications  Blue Lumen Status Capped (Central line)  Red Lumen Status Capped (Central line)  Dressing Type Biopatch;Occlusive  Dressing Status Clean;Dry;Intact  Drainage Description None

## 2018-07-05 NOTE — Progress Notes (Signed)
Patient's wife & mother will be here at [facility] tomorrow to be present for his wakeup assessment.    Patient is confused and disoriented.  He gets agitated easily when touching his face.  When his oxygen drops, moving the nose clip to the other nostril usually gets it back at 98-100%.  Patient does not like the nose clips.  Multiple RNs had to hold his hands to prevent him from pulling his vent tube.  His medications were increased.  Patient received dialysis today.  Levo was restarted before that, so his BP could tolerate it.  They removed ~ 1L of fluid.  Phillis Knack, RN

## 2018-07-05 NOTE — Progress Notes (Signed)
CRITICAL CARE NOTE   SYNOPSIS 48 yo male with a PMH of Tuberculosis(dx 02/2018 underwent bronchoscopy for sampling on 03/28/2018 by Dr. Mattie Marlin HIV (currently not on treatment). He presented to [facility] ER on 06/9 via EMS withsevere respiratory distress.  Per ER notes pts family called the sheriff's department to perform a wellness check because they had not heard from the ptin several days. Thefire department found the pt confused and lying supine on the ground withc/odifficulty breathing. -Pt placed on NRB en route to the ER. Upon arrival to the ER pt transitioned to Bipap due to continued respiratory failure. Lab results revealed Na+ 131, CO2 12, lactic acid 3.6, COVID-19 negative, and vbg pH 7.23/pCO2 35. -CXR concerning for right lower lobe airspace opacity likely secondary topneumonia and probable small right-sided parapneumonic effusion. -received 2L NS bolus, vancomycin, cefepime, and clindamycin. -intubation in the emergency room. -transient arrest of less than a minute where he became pulseless and required epinephrine and atropine. He has been pressor dependent since that episode. Now requiring norepinephrine and vasopressin.   Severe Septic shock from E coli/MSSA/GBS bacteremia from pneumonia and UTI with progressive multiorgan failure/renal failure/resp failure    CC  follow up respiratory failure  SUBJECTIVE Patient remains critically ill Prognosis is guarded Off CRRT No pressors multiorgan failure Severe resp failure Failed weaning trials   Vent Mode: PRVC FiO2 (%):  [40 %] 40 % Set Rate:  [30 bmp] 30 bmp Vt Set:  [500 mL] 500 mL PEEP:  [5 cmH20-8 cmH20] 8 cmH20 Pressure Support:  [5 cmH20] 5 cmH20   BP 91/78   Pulse 92   Temp (!) 97.4 F (36.3 C) (Axillary)   Resp (!) 30   Ht 6' (1.829 m)   Wt 72.7 kg   SpO2 100%   BMI 21.74 kg/m    I/O last 3 completed shifts: In: 1714.3 [I.V.:1294.6; Other:50; NG/GT:200.7; IV  Piggyback:169] Out: 75 [Urine:75] No intake/output data recorded.  SpO2: 100 % FiO2 (%): 40 %   SIGNIFICANT EVENTS 6/9 admitted for severe resp failure/SHock, E. COLI BACTEREMIA, GRAM +COCCI bacteremia 6/10 multiorgan failure, severe septic shock 6/11 off CRRT, resp failure, shock 6/12 failed weaning trials CT chest RLL opacity c/w pneumonia  REVIEW OF SYSTEMS  PATIENT IS UNABLE TO PROVIDE COMPLETE REVIEW OF SYSTEMS DUE TO SEVERE CRITICAL ILLNESS   PHYSICAL EXAMINATION:  GENERAL:critically ill appearing, +resp distress HEAD: Normocephalic, atraumatic.  EYES: Pupils equal, round, reactive to light.  No scleral icterus.  MOUTH: Moist mucosal membrane. NECK: Supple. No thyromegaly. No nodules. No JVD.  PULMONARY: +rhonchi, +wheezing CARDIOVASCULAR: S1 and S2. Regular rate and rhythm. No murmurs, rubs, or gallops.  GASTROINTESTINAL: Soft, nontender, -distended. No masses. Positive bowel sounds. No hepatosplenomegaly.  MUSCULOSKELETAL: No swelling, clubbing, or edema.  NEUROLOGIC: obtunded, GCS<8 SKIN:intact,warm,dry  MEDICATIONS: I have reviewed all medications and confirmed regimen as documented   CULTURE RESULTS   Recent Results (from the past 240 hour(s))  SARS Coronavirus 2 (CEPHEID- Performed in [place] hospital lab), Hosp Order     Status: None   Collection Time: 07/01/18  1:19 AM   Specimen: Nasopharyngeal Swab  Result Value Ref Range Status   SARS Coronavirus 2 NEGATIVE NEGATIVE Final    Comment: (NOTE) If result is NEGATIVE SARS-CoV-2 target nucleic acids are NOT DETECTED. The SARS-CoV-2 RNA is generally detectable in upper and lower  respiratory specimens during the acute phase of infection. The lowest  concentration of SARS-CoV-2 viral copies this assay can detect is 250  copies /  mL. A negative result does not preclude SARS-CoV-2 infection  and should not be used as the sole basis for treatment or other  patient management decisions.  A negative result  may occur with  improper specimen collection / handling, submission of specimen other  than nasopharyngeal swab, presence of viral mutation(s) within the  areas targeted by this assay, and inadequate number of viral copies  (<250 copies / mL). A negative result must be combined with clinical  observations, patient history, and epidemiological information. If result is POSITIVE SARS-CoV-2 target nucleic acids are DETECTED. The SARS-CoV-2 RNA is generally detectable in upper and lower  respiratory specimens dur ing the acute phase of infection.  Positive  results are indicative of active infection with SARS-CoV-2.  Clinical  correlation with patient history and other diagnostic information is  necessary to determine patient infection status.  Positive results do  not rule out bacterial infection or co-infection with other viruses. If result is PRESUMPTIVE POSTIVE SARS-CoV-2 nucleic acids MAY BE PRESENT.   A presumptive positive result was obtained on the submitted specimen  and confirmed on repeat testing.  While 2019 novel coronavirus  (SARS-CoV-2) nucleic acids may be present in the submitted sample  additional confirmatory testing may be necessary for epidemiological  and / or clinical management purposes  to differentiate between  SARS-CoV-2 and other Sarbecovirus currently known to infect humans.  If clinically indicated additional testing with an alternate test  methodology [phone]) is advised. The SARS-CoV-2 RNA is generally  detectable in upper and lower respiratory sp ecimens during the acute  phase of infection. The expected result is Negative. Fact Sheet for Patients:  StrictlyIdeas.no Fact Sheet for Healthcare Providers: BankingDealers.co.za This test is not yet approved or cleared by the [place] FDA and has been authorized for detection and/or diagnosis of SARS-CoV-2 by FDA under an Emergency Use Authorization (EUA).   This EUA will remain in effect (meaning this test can be used) for the duration of the COVID-19 declaration under Section 564(b)(1) of the Act, 21 U.S.C. section 360bbb-3(b)(1), unless the authorization is terminated or revoked sooner. Performed at [facility], [address] [place], [place] [address]   Blood culture (routine x 2)     Status: Abnormal   Collection Time: 07/01/18  1:19 AM   Specimen: BLOOD  Result Value Ref Range Status   Specimen Description   Final    BLOOD LEFT ANTECUBITAL Performed at [facility], [place] [address], [place], [place] [address]    Special Requests   Final    BOTTLES DRAWN AEROBIC AND ANAEROBIC Blood Culture results may not be optimal due to an excessive volume of blood received in culture bottles Performed at [facility], [address], [place], [place] [address]    Culture  Setup Time   Final    GRAM NEGATIVE RODS IN BOTH AEROBIC AND ANAEROBIC BOTTLES CRITICAL RESULT CALLED TO, READ BACK BY AND VERIFIED WITH: CHRISTINE KATSOUDAS AT 7673 07/01/18 SG Performed at [facility], [address] [place], [place] [address]    Culture ESCHERICHIA COLI (A)  Final   Report Status 07/03/2018 FINAL  Final   Organism ID, Bacteria ESCHERICHIA COLI  Final      Susceptibility   Escherichia coli - MIC*    AMPICILLIN 4 SENSITIVE Sensitive     CEFAZOLIN <=4 SENSITIVE Sensitive     CEFEPIME <=1 SENSITIVE Sensitive     CEFTAZIDIME <=1 SENSITIVE Sensitive     CEFTRIAXONE <=1  SENSITIVE Sensitive     CIPROFLOXACIN <=0.25 SENSITIVE Sensitive     GENTAMICIN <=1 SENSITIVE Sensitive     IMIPENEM <=0.25 SENSITIVE Sensitive     TRIMETH/SULFA <=20 SENSITIVE Sensitive     AMPICILLIN/SULBACTAM <=2 SENSITIVE Sensitive     PIP/TAZO <=4 SENSITIVE Sensitive     Extended ESBL NEGATIVE Sensitive     * ESCHERICHIA COLI  Blood culture (routine x 2)     Status: Abnormal   Collection Time: 07/01/18  3:09 AM   Specimen: BLOOD  Result Value  Ref Range Status   Specimen Description   Final    BLOOD LEFT ARM Performed at [facility], [address] [place], [place] [address]    Special Requests   Final    BOTTLES DRAWN AEROBIC AND ANAEROBIC Blood Culture adequate volume Performed at [facility], [address] [place], [place] [address]    Culture  Setup Time   Final    GRAM POSITIVE COCCI IN BOTH AEROBIC AND ANAEROBIC BOTTLES GRAM NEGATIVE RODS IN BOTH AEROBIC AND ANAEROBIC BOTTLES CRITICAL RESULT CALLED TO, READ BACK BY AND VERIFIED WITH: CHRISTINE KATSOUDAS AT 2505 07/01/18 SG Performed at [facility], [place] [address], [place], [place] [address]    Culture (A)  Final    ESCHERICHIA COLI ORGANISM 1 SUSCEPTIBILITIES PERFORMED ON PREVIOUS CULTURE WITHIN THE LAST 5 DAYS. STAPHYLOCOCCUS AUREUS VIRIDANS STREPTOCOCCUS THE SIGNIFICANCE OF ISOLATING THIS ORGANISM FROM A SINGLE SET OF BLOOD CULTURES WHEN MULTIPLE SETS ARE DRAWN IS UNCERTAIN. PLEASE NOTIFY THE MICROBIOLOGY DEPARTMENT WITHIN ONE WEEK IF SPECIATION AND SENSITIVITIES ARE REQUIRED. GROUP B STREP(S.AGALACTIAE)ISOLATED    Report Status 07/04/2018 FINAL  Final   Organism ID, Bacteria STAPHYLOCOCCUS AUREUS  Final   Organism ID, Bacteria GROUP B STREP(S.AGALACTIAE)ISOLATED  Final      Susceptibility   Group b strep(s.agalactiae)isolated - MIC*    CLINDAMYCIN >=1 RESISTANT Resistant     AMPICILLIN <=0.25 SENSITIVE Sensitive     ERYTHROMYCIN >=8 RESISTANT Resistant     VANCOMYCIN 0.5 SENSITIVE Sensitive     CEFTRIAXONE <=0.12 SENSITIVE Sensitive     LEVOFLOXACIN 1 SENSITIVE Sensitive     * GROUP B STREP(S.AGALACTIAE)ISOLATED   Staphylococcus aureus - MIC*    CIPROFLOXACIN 1 SENSITIVE Sensitive     ERYTHROMYCIN >=8 RESISTANT Resistant     GENTAMICIN <=0.5 SENSITIVE Sensitive     OXACILLIN 0.5 SENSITIVE Sensitive     TETRACYCLINE <=1 SENSITIVE Sensitive     VANCOMYCIN 1 SENSITIVE Sensitive     TRIMETH/SULFA <=10 SENSITIVE Sensitive      CLINDAMYCIN RESISTANT Resistant     RIFAMPIN <=0.5 SENSITIVE Sensitive     Inducible Clindamycin POSITIVE Resistant     * STAPHYLOCOCCUS AUREUS  Blood Culture ID Panel (Reflexed)     Status: Abnormal   Collection Time: 07/01/18  3:09 AM  Result Value Ref Range Status   Enterococcus species NOT DETECTED NOT DETECTED Final   Listeria monocytogenes NOT DETECTED NOT DETECTED Final   Staphylococcus species DETECTED (A) NOT DETECTED Final    Comment: CRITICAL RESULT CALLED TO, READ BACK BY AND VERIFIED WITH: CHRISTINE KATSOUDAS AT 1325 07/01/18 SG    Staphylococcus aureus (BCID) DETECTED (A) NOT DETECTED Final    Comment: Methicillin (oxacillin) susceptible Staphylococcus aureus (MSSA). Preferred therapy is anti staphylococcal beta lactam antibiotic (Cefazolin or Nafcillin), unless clinically contraindicated. CRITICAL RESULT CALLED TO, READ BACK BY AND VERIFIED WITH: CHRISTINE KATSOUDAS AT 3419 07/01/18 SG    Methicillin resistance NOT DETECTED NOT DETECTED Final  Streptococcus species DETECTED (A) NOT DETECTED Final    Comment: CRITICAL RESULT CALLED TO, READ BACK BY AND VERIFIED WITH: CHRISTINE KATSOUDAS AT 5374 07/01/18 SG    Streptococcus agalactiae DETECTED (A) NOT DETECTED Final    Comment: CRITICAL RESULT CALLED TO, READ BACK BY AND VERIFIED WITH: CHRISTINE KATSOUDAS AT 8270 07/01/18 SG    Streptococcus pneumoniae NOT DETECTED NOT DETECTED Final   Streptococcus pyogenes NOT DETECTED NOT DETECTED Final   Acinetobacter baumannii NOT DETECTED NOT DETECTED Final   Enterobacteriaceae species DETECTED (A) NOT DETECTED Final    Comment: Enterobacteriaceae represent a large family of gram-negative bacteria, not a single organism. CRITICAL RESULT CALLED TO, READ BACK BY AND VERIFIED WITH: CHRISTINE KATSOUDAS AT 7867 07/01/18 SG    Enterobacter cloacae complex NOT DETECTED NOT DETECTED Final   Escherichia coli DETECTED (A) NOT DETECTED Final    Comment: CRITICAL RESULT CALLED TO, READ BACK BY AND  VERIFIED WITH: CHRISTINE KATSOUDAS AT 5449 07/01/18 SG    Klebsiella oxytoca NOT DETECTED NOT DETECTED Final   Klebsiella pneumoniae NOT DETECTED NOT DETECTED Final   Proteus species NOT DETECTED NOT DETECTED Final   Serratia marcescens NOT DETECTED NOT DETECTED Final   Carbapenem resistance NOT DETECTED NOT DETECTED Final   Haemophilus influenzae NOT DETECTED NOT DETECTED Final   Neisseria meningitidis NOT DETECTED NOT DETECTED Final   Pseudomonas aeruginosa NOT DETECTED NOT DETECTED Final   Candida albicans NOT DETECTED NOT DETECTED Final   Candida glabrata NOT DETECTED NOT DETECTED Final   Candida krusei NOT DETECTED NOT DETECTED Final   Candida parapsilosis NOT DETECTED NOT DETECTED Final   Candida tropicalis NOT DETECTED NOT DETECTED Final    Comment: Performed at [facility], [address] [place], [place] [address]  MRSA PCR Screening     Status: None   Collection Time: 07/01/18  9:53 AM   Specimen: Nasopharyngeal  Result Value Ref Range Status   MRSA by PCR NEGATIVE NEGATIVE Final    Comment:        The GeneXpert MRSA Assay (FDA approved for NASAL specimens only), is one component of a comprehensive MRSA colonization surveillance program. It is not intended to diagnose MRSA infection nor to guide or monitor treatment for MRSA infections. Performed at [facility], [address] [place], [place] [address]   Culture, respiratory (non-expectorated)     Status: None (Preliminary result)   Collection Time: 07/02/18  6:48 PM   Specimen: Tracheal Aspirate; Respiratory  Result Value Ref Range Status   Specimen Description   Final    TRACHEAL ASPIRATE Performed at [facility], [address] [place], [place] [address]    Special Requests   Final    NONE Performed at [facility], [address] [place], [place] [address]    Gram Stain   Final    RARE WBC PRESENT, PREDOMINANTLY PMN NO ORGANISMS SEEN    Culture   Final     NO GROWTH 2 DAYS Performed at [facility], [place] [address] [place], [place] [address]    Report Status PENDING  Incomplete  Culture, blood (routine x 2)     Status: None (Preliminary result)   Collection Time: 07/03/18 12:51 AM   Specimen: BLOOD  Result Value Ref Range Status   Specimen Description BLOOD RIGHT HAND  Final   Special Requests   Final    BOTTLES DRAWN AEROBIC AND ANAEROBIC Blood Culture adequate volume   Culture   Final  NO GROWTH 1 DAY Performed at [facility], [address] [place],  [address]    Report Status PENDING  Incomplete  Culture, blood (routine x 2)     Status: None (Preliminary result)   Collection Time: 07/03/18 12:58 AM   Specimen: BLOOD  Result Value Ref Range Status   Specimen Description BLOOD LEFT HAND  Final   Special Requests   Final    BOTTLES DRAWN AEROBIC AND ANAEROBIC Blood Culture adequate volume   Culture   Final    NO GROWTH 1 DAY Performed at [facility], [address], [place],  [address]    Report Status PENDING  Incomplete          IMAGING    Ct Chest Wo Contrast  Result Date: 07/04/2018 CLINICAL DATA:  48 year old male with a past medical history of tuberculosis and HIV. The patient currently has severe respiratory distress and findings concerning for right lower lobe pneumonia. EXAM: CT CHEST WITHOUT CONTRAST TECHNIQUE: Multidetector CT imaging of the chest was performed following the standard protocol without IV contrast. COMPARISON:  CT chest dated 03/18/2018 FINDINGS: Cardiovascular: The heart is enlarged. There are mild coronary artery calcifications. There is no significant pericardial effusion. Mediastinum/Nodes: There are mildly prominent mediastinal and hilar lymph nodes. There are some prominent but subcentimeter axillary and supraclavicular lymph nodes. The thyroid gland is unremarkable. The esophagus is unremarkable. Lungs/Pleura: Emphysematous changes are noted  bilaterally. Endotracheal tube terminates above the carina. There is debris within the right mainstem bronchus. There is a large area of consolidation involving virtually all of the right lower lobe. There are additional scattered areas of consolidation involving all remaining lobes. There is no pneumothorax. There is no significant pleural effusion. The previously demonstrated cavitary area within the right upper lobe has improved. Upper Abdomen: No acute abnormality. Musculoskeletal: No chest wall mass or suspicious bone lesions identified. IMPRESSION: 1. There is consolidation involving nearly all of the right lower lobe. There are additional smaller areas of consolidation involving all remaining lobes. Findings are consistent with multifocal pneumonia. 2. Interval decrease in size of the previously demonstrated cavitary lesion in the right upper lobe. 3. No pneumothorax or significant pleural effusion. 4. Mild cardiomegaly. 5. Emphysema. Emphysema (ICD10-J43.9). Electronically Signed   By: Constance Holster M.D.   On: 07/04/2018 19:21       Indwelling Urinary Catheter continued, requirement due to   Reason to continue Indwelling Urinary Catheter strict Intake/Output monitoring for hemodynamic instability   Central Line/ continued, requirement due to  Reason to continue [place] of central venous pressure or other hemodynamic parameters and poor IV access   Ventilator continued, requirement due to severe respiratory failure   Ventilator Sedation RASS 0 to -2      ASSESSMENT AND PLAN SYNOPSIS   Severe ACUTE Hypoxic and Hypercapnic Respiratory Failure -continue Full MV support -continue Bronchodilator Therapy -Wean Fio2 and PEEP as tolerated -will perform SAT/SBT when respiratory parameters are met   ACUTE KIDNEY INJURY/Renal Failure -follow chem 7 -follow UO -continue Foley Catheter-assess need -Avoid nephrotoxic agents -Recheck creatinine    NEUROLOGY -  intubated and sedated - minimal sedation to achieve a RASS goal: -1 Wake up assessment pending   SHOCK-SEPSIS/HYPOVOLUMIC -use vasopressors to keep MAP>65 as needed -follow ABG and LA -stress dose steroids   CARDIAC ICU monitoring  ID -continue IV abx as prescibed -follow up cultures  GI GI PROPHYLAXIS as indicated  NUTRITIONAL STATUS DIET-->TF's as tolerated Constipation protocol as indicated  ENDO - will use ICU hypoglycemic\Hyperglycemia protocol if indicated   ELECTROLYTES -follow labs as needed -replace as needed -pharmacy consultation and following   DVT/GI PRX ordered TRANSFUSIONS AS NEEDED MONITOR FSBS ASSESS the need for LABS as needed   Critical Care Time devoted to patient care services described in this note is 34 minutes.   Overall, patient is critically ill, prognosis is guarded.  Patient with Multiorgan failure and at high risk for cardiac arrest and death.    Corrin Parker, M.D.  [facility]  Medical Director [place] Director [facility] Cardio-Pulmonary Department

## 2018-07-05 NOTE — Progress Notes (Deleted)
HD Tx End    07/05/18 1730  Vital Signs  Pulse Rate 74  Resp 14  BP 109/79  Oxygen Therapy  SpO2 94 %  End Tidal CO2 (EtCO2) 32  During Hemodialysis Assessment  Blood Flow Rate (mL/min) 200 mL/min  Arterial Pressure (mmHg) -100 mmHg  Venous Pressure (mmHg) 120 mmHg  Transmembrane Pressure (mmHg) 40 mmHg  Ultrafiltration Rate (mL/min) 670 mL/min  Dialysate Flow Rate (mL/min) 800 ml/min  Conductivity: Machine  14  HD Safety Checks Performed Yes  Dialysis Fluid Bolus Normal Saline  Bolus Amount (mL) 250 mL  Intra-Hemodialysis Comments Tx completed  Hemodialysis Catheter Left Femoral vein Triple lumen Temporary  Placement Date/Time: 07/02/18 1633   Placed prior to admission: No  Person Inserting Catheter: Hinton Dyer, NP  Orientation: Left  Access Location: Femoral vein  Hemodialysis Catheter Type: Triple lumen Temporary  Site Condition No complications  Blue Lumen Status Flushed;Capped (Central line);Heparin locked  Red Lumen Status Flushed;Capped (Central line);Heparin locked  Catheter fill solution Heparin 1000 units/ml  Catheter fill volume (Arterial) 1.4 cc  Catheter fill volume (Venous) 1.4  Dressing Type Biopatch;Occlusive  Dressing Status Clean;Dry;Intact  Drainage Description None  Post treatment catheter status Capped and Clamped

## 2018-07-05 NOTE — Progress Notes (Signed)
Dr. Holley Raring ordered CRRT stopped. Blood rinsebacked to patient with no difficulty. Temporary dialysis catheter flushed and heparin locked.

## 2018-07-05 NOTE — Progress Notes (Signed)
HD Tx End  1 liter fluid removal   07/05/18 1830  Hand-Off documentation  Report given to (Full Name) Lorn Junes RN  Report received from (Full Name) Trellis Paganini RN  Vital Signs  Temp 98.2 F (36.8 C)  Temp Source Axillary  Pulse Rate 75  Pulse Rate Source Monitor  Resp 13  BP 105/78  BP Location Left Arm  BP Method Automatic  Patient Position (if appropriate) Lying  Oxygen Therapy  SpO2 97 %  O2 Device Ventilator  End Tidal CO2 (EtCO2) 35  Critical Care Pain Observation Tool (CPOT)  Facial Expression 0  Body Movements 0  Muscle Tension 0  Compliance with ventilator (intubated pts.) 0  Vocalization (extubated pts.) N/A  CPOT Total 0  Dialysis Weight  Weight 71.7 kg  Type of Weight Post-Dialysis  During Hemodialysis Assessment  Blood Flow Rate (mL/min) 200 mL/min  Arterial Pressure (mmHg) -180 mmHg  Venous Pressure (mmHg) 190 mmHg  Transmembrane Pressure (mmHg) 40 mmHg  Ultrafiltration Rate (mL/min) 670 mL/min  Dialysate Flow Rate (mL/min) 800 ml/min  Conductivity: Machine  414  HD Safety Checks Performed Yes  Intra-Hemodialysis Comments Tx completed

## 2018-07-05 NOTE — Progress Notes (Signed)
Post HD Assessment    07/05/18 1859  Neurological  Level of Consciousness Responds to Pain  Orientation Level Intubated/Tracheostomy - Unable to assess  Respiratory  Respiratory Pattern Regular  Chest Assessment Chest expansion symmetrical  Bilateral Breath Sounds Diminished  Airway 7.5 mm  Placement Date/Time: 07/01/18 (c) 2897   Difficult airway due to:: Difficulty was unanticipated  Laryngoscope Blade: MAC;4  Size (mm): 7.5 mm  Cuffed: Cuffed  Insertion attempts: 1  Airway Equipment: Video Laryngoscope  Placement Confirmation: Bilater...  Measured From Lips  Secured By [employer]  Cardiac  Pulse Regular  Heart Sounds S1, S2  Jugular Venous Distention (JVD) No  Cardiac Rhythm NSR  Antiarrhythmic device No  Integumentary  Integumentary (WDL) WDL (I did not find any skin tears - foam on buttocks)  Musculoskeletal  Musculoskeletal (WDL) X  Generalized Weakness Yes  Gastrointestinal  Bowel Sounds Assessment Hypoactive  GU Assessment  Genitourinary (WDL) X  Genitourinary Symptoms Anuria  Psychosocial  Psychosocial (WDL) X  Patient Behaviors Not interactive  Emotional support given Given to patient;Given to patient's family (spoke with wife on phone regarding HD [place])

## 2018-07-05 NOTE — Progress Notes (Signed)
[facility]  ROUNDING NOTE   Subjective:  CRRT stopped earlier this a.m. Foley was removed yesterday. We will plan for another dialysis today.  Objective:  Vital signs in last 24 hours:  Temp:  [96.4 F (35.8 C)-97.5 F (36.4 C)] 96.4 F (35.8 C) (06/13 0730) Pulse Rate:  [70-211] 81 (06/13 1200) Resp:  [15-34] 21 (06/13 1200) BP: (68-121)/(57-99) 109/83 (06/13 1200) SpO2:  [86 %-100 %] 97 % (06/13 1200) FiO2 (%):  [30 %-40 %] 30 % (06/13 1200) Weight:  [72.7 kg] 72.7 kg (06/13 0357)  Weight change: -0.2 kg Filed Weights   07/03/18 0442 07/04/18 0251 07/05/18 0357  Weight: 72.8 kg 72.9 kg 72.7 kg    Intake/Output: I/O last 3 completed shifts: In: 1853.3 [I.V.:1352.6; Other:50; NG/GT:200.7; IV Piggyback:250] Out: 75 [Urine:75]   Intake/Output this shift:  Total I/O In: 193.2 [I.V.:193.2] Out: 0   Physical Exam: General: Critically ill-appearing  Head: Severe temporal wasting  Eyes: Anicteric  Neck: Supple, trachea midline  Lungs:  Basilar rales, vent assisted  Heart: S1S2 no rubs  Abdomen:  Soft, nontender, bowel sounds present  Extremities: no peripheral edema.  Neurologic: sedated  Skin: No lesions  Access: Left femoral temporary dialysis catheter    Basic Metabolic Panel: Recent Labs  Lab 07/04/18 1531 07/04/18 1949 07/05/18 0003 07/05/18 0324 07/05/18 0746  NA 135 133* 135 134* 135  K 4.0 4.2 4.4 4.4 4.5  CL 102 101 103 102 103  CO2 _0 GLUCOSE 130* 184* 129* 124* 122*  BUN 36* 35* 32* 29* 31*  CREATININE 1.65* 1.65* 1.51* 1.27* 1.35*  CALCIUM 7.9* 7.7* 8.1* 7.9* 8.1*  MG 2.0 2.1 2.0 1.9 1.9  PHOS 2.1* 2.6 2.7 2.5 2.4*    Liver Function Tests: Recent Labs  Lab 07/01/18 0206  07/04/18 1531 07/04/18 1949 07/05/18 0003 07/05/18 0324 07/05/18 0746  AST 83*  --   --   --   --   --   --   ALT 18  --   --   --   --   --   --   ALKPHOS 128*  --   --   --   --   --   --   BILITOT QUANTITY NOT SUFFICIENT, UNABLE TO  PERFORM TEST  --   --   --   --   --   --   PROT 7.7  --   --   --   --   --   --   ALBUMIN QUANTITY NOT SUFFICIENT, UNABLE TO PERFORM TEST   < > 1.6* 1.6* 1.6* 1.6* 1.6*   < > = values in this interval not displayed.   No results for input(s): LIPASE, AMYLASE in the last 168 hours. No results for input(s): AMMONIA in the last 168 hours.  CBC: Recent Labs  Lab 07/01/18 1400 07/02/18 0517 07/02/18 1151 07/03/18 0345 07/04/18 0340 07/05/18 0447  WBC 5.0 12.0* 16.0* 26.2* 24.4* 21.8*  NEUTROABS 4.2  --  14.4* 19.6* 21.0* 18.8*  HGB 13.2 12.3* 12.4* 11.4* 9.3* 10.7*  HCT 39.7 35.0* 33.7* 31.3* 26.1* 30.3*  MCV 100.3* 95.1 92 92.3 92.6 94.7  PLT 236 152 114* 113* 86* 100*    Cardiac Enzymes: Recent Labs  Lab 07/01/18 0206 07/01/18 0431  CKTOTAL  --  220  TROPONINI QUANTITY NOT SUFFICIENT, UNABLE TO PERFORM TEST 0.05*    BNP: Invalid input(s): POCBNP  CBG: Recent Labs  Lab 07/04/18 2017 07/05/18  0008 07/05/18 0322 07/05/18 0742 07/05/18 1149  GLUCAP 136* 121* 115* 116* 118*    Microbiology: Results for orders placed or performed during the hospital encounter of 07/01/18  SARS Coronavirus 2 (CEPHEID- Performed in [facility] hospital lab), Hosp Order     Status: None   Collection Time: 07/01/18  1:19 AM   Specimen: Nasopharyngeal Swab  Result Value Ref Range Status   SARS Coronavirus 2 NEGATIVE NEGATIVE Final    Comment: (NOTE) If result is NEGATIVE SARS-CoV-2 target nucleic acids are NOT DETECTED. The SARS-CoV-2 RNA is generally detectable in upper and lower  respiratory specimens during the acute phase of infection. The lowest  concentration of SARS-CoV-2 viral copies this assay can detect is 250  copies / mL. A negative result does not preclude SARS-CoV-2 infection  and should not be used as the sole basis for treatment or other  patient management decisions.  A negative result may occur with  improper specimen collection / handling, submission of specimen  other  than nasopharyngeal swab, presence of viral mutation(s) within the  areas targeted by this assay, and inadequate number of viral copies  (<250 copies / mL). A negative result must be combined with clinical  observations, patient history, and epidemiological information. If result is POSITIVE SARS-CoV-2 target nucleic acids are DETECTED. The SARS-CoV-2 RNA is generally detectable in upper and lower  respiratory specimens dur ing the acute phase of infection.  Positive  results are indicative of active infection with SARS-CoV-2.  Clinical  correlation with patient history and other diagnostic information is  necessary to determine patient infection status.  Positive results do  not rule out bacterial infection or co-infection with other viruses. If result is PRESUMPTIVE POSTIVE SARS-CoV-2 nucleic acids MAY BE PRESENT.   A presumptive positive result was obtained on the submitted specimen  and confirmed on repeat testing.  While 2019 novel coronavirus  (SARS-CoV-2) nucleic acids may be present in the submitted sample  additional confirmatory testing may be necessary for epidemiological  and / or clinical management purposes  to differentiate between  SARS-CoV-2 and other Sarbecovirus currently known to infect humans.  If clinically indicated additional testing with an alternate test  methodology [phone]) is advised. The SARS-CoV-2 RNA is generally  detectable in upper and lower respiratory sp ecimens during the acute  phase of infection. The expected result is Negative. Fact Sheet for Patients:  StrictlyIdeas.no Fact Sheet for Healthcare Providers: BankingDealers.co.za This test is not yet approved or cleared by the [place] FDA and has been authorized for detection and/or diagnosis of SARS-CoV-2 by FDA under an Emergency Use Authorization (EUA).  This EUA will remain in effect (meaning this test can be used) for the duration  of the COVID-19 declaration under Section 564(b)(1) of the Act, 21 U.S.C. section 360bbb-3(b)(1), unless the authorization is terminated or revoked sooner. Performed at [facility], [address] [place], [place] [address]   Blood culture (routine x 2)     Status: Abnormal   Collection Time: 07/01/18  1:19 AM   Specimen: BLOOD  Result Value Ref Range Status   Specimen Description   Final    BLOOD LEFT ANTECUBITAL Performed at [facility], [place] [address] [place], [place] [address]    Special Requests   Final    BOTTLES DRAWN AEROBIC AND ANAEROBIC Blood Culture results may not be optimal due to an excessive volume of blood received in culture bottles Performed at [facility], [place],  [place], [place] [address]    Culture  Setup Time   Final    GRAM NEGATIVE RODS IN BOTH AEROBIC AND ANAEROBIC BOTTLES CRITICAL RESULT CALLED TO, READ BACK BY AND VERIFIED WITH: CHRISTINE KATSOUDAS AT 1017 07/01/18 SG Performed at [facility], [address] [place], [place] [address]    Culture ESCHERICHIA COLI (A)  Final   Report Status 07/03/2018 FINAL  Final   Organism ID, Bacteria ESCHERICHIA COLI  Final      Susceptibility   Escherichia coli - MIC*    AMPICILLIN 4 SENSITIVE Sensitive     CEFAZOLIN <=4 SENSITIVE Sensitive     CEFEPIME <=1 SENSITIVE Sensitive     CEFTAZIDIME <=1 SENSITIVE Sensitive     CEFTRIAXONE <=1 SENSITIVE Sensitive     CIPROFLOXACIN <=0.25 SENSITIVE Sensitive     GENTAMICIN <=1 SENSITIVE Sensitive     IMIPENEM <=0.25 SENSITIVE Sensitive     TRIMETH/SULFA <=20 SENSITIVE Sensitive     AMPICILLIN/SULBACTAM <=2 SENSITIVE Sensitive     PIP/TAZO <=4 SENSITIVE Sensitive     Extended ESBL NEGATIVE Sensitive     * ESCHERICHIA COLI  Blood culture (routine x 2)     Status: Abnormal   Collection Time: 07/01/18  3:09 AM   Specimen: BLOOD  Result Value Ref Range Status   Specimen Description   Final    BLOOD LEFT ARM Performed at  [facility], [address], [place], [place] [address]    Special Requests   Final    BOTTLES DRAWN AEROBIC AND ANAEROBIC Blood Culture adequate volume Performed at [facility], [place], [place] [address]    Culture  Setup Time   Final    GRAM POSITIVE COCCI IN BOTH AEROBIC AND ANAEROBIC BOTTLES GRAM NEGATIVE RODS IN BOTH AEROBIC AND ANAEROBIC BOTTLES CRITICAL RESULT CALLED TO, READ BACK BY AND VERIFIED WITH: CHRISTINE KATSOUDAS AT 5361 07/01/18 SG Performed at [facility], [place] [address], [place], [place] [address]    Culture (A)  Final    ESCHERICHIA COLI ORGANISM 1 SUSCEPTIBILITIES PERFORMED ON PREVIOUS CULTURE WITHIN THE LAST 5 DAYS. STAPHYLOCOCCUS AUREUS VIRIDANS STREPTOCOCCUS THE SIGNIFICANCE OF ISOLATING THIS ORGANISM FROM A SINGLE SET OF BLOOD CULTURES WHEN MULTIPLE SETS ARE DRAWN IS UNCERTAIN. PLEASE NOTIFY THE MICROBIOLOGY DEPARTMENT WITHIN ONE WEEK IF SPECIATION AND SENSITIVITIES ARE REQUIRED. GROUP B STREP(S.AGALACTIAE)ISOLATED    Report Status 07/04/2018 FINAL  Final   Organism ID, Bacteria STAPHYLOCOCCUS AUREUS  Final   Organism ID, Bacteria GROUP B STREP(S.AGALACTIAE)ISOLATED  Final      Susceptibility   Group b strep(s.agalactiae)isolated - MIC*    CLINDAMYCIN >=1 RESISTANT Resistant     AMPICILLIN <=0.25 SENSITIVE Sensitive     ERYTHROMYCIN >=8 RESISTANT Resistant     VANCOMYCIN 0.5 SENSITIVE Sensitive     CEFTRIAXONE <=0.12 SENSITIVE Sensitive     LEVOFLOXACIN 1 SENSITIVE Sensitive     * GROUP B STREP(S.AGALACTIAE)ISOLATED   Staphylococcus aureus - MIC*    CIPROFLOXACIN 1 SENSITIVE Sensitive     ERYTHROMYCIN >=8 RESISTANT Resistant     GENTAMICIN <=0.5 SENSITIVE Sensitive     OXACILLIN 0.5 SENSITIVE Sensitive     TETRACYCLINE <=1 SENSITIVE Sensitive     VANCOMYCIN 1 SENSITIVE Sensitive     TRIMETH/SULFA <=10 SENSITIVE Sensitive     CLINDAMYCIN RESISTANT Resistant     RIFAMPIN <=0.5 SENSITIVE Sensitive      Inducible Clindamycin POSITIVE Resistant     * STAPHYLOCOCCUS AUREUS  Blood Culture ID Panel (Reflexed)  Status: Abnormal   Collection Time: 07/01/18  3:09 AM  Result Value Ref Range Status   Enterococcus species NOT DETECTED NOT DETECTED Final   Listeria monocytogenes NOT DETECTED NOT DETECTED Final   Staphylococcus species DETECTED (A) NOT DETECTED Final    Comment: CRITICAL RESULT CALLED TO, READ BACK BY AND VERIFIED WITH: CHRISTINE KATSOUDAS AT 0177 07/01/18 SG    Staphylococcus aureus (BCID) DETECTED (A) NOT DETECTED Final    Comment: Methicillin (oxacillin) susceptible Staphylococcus aureus (MSSA). Preferred therapy is anti staphylococcal beta lactam antibiotic (Cefazolin or Nafcillin), unless clinically contraindicated. CRITICAL RESULT CALLED TO, READ BACK BY AND VERIFIED WITH: CHRISTINE KATSOUDAS AT 9390 07/01/18 SG    Methicillin resistance NOT DETECTED NOT DETECTED Final   Streptococcus species DETECTED (A) NOT DETECTED Final    Comment: CRITICAL RESULT CALLED TO, READ BACK BY AND VERIFIED WITH: CHRISTINE KATSOUDAS AT 1325 07/01/18 SG    Streptococcus agalactiae DETECTED (A) NOT DETECTED Final    Comment: CRITICAL RESULT CALLED TO, READ BACK BY AND VERIFIED WITH: CHRISTINE KATSOUDAS AT 3009 07/01/18 SG    Streptococcus pneumoniae NOT DETECTED NOT DETECTED Final   Streptococcus pyogenes NOT DETECTED NOT DETECTED Final   Acinetobacter baumannii NOT DETECTED NOT DETECTED Final   Enterobacteriaceae species DETECTED (A) NOT DETECTED Final    Comment: Enterobacteriaceae represent a large family of gram-negative bacteria, not a single organism. CRITICAL RESULT CALLED TO, READ BACK BY AND VERIFIED WITH: CHRISTINE KATSOUDAS AT 2330 07/01/18 SG    Enterobacter cloacae complex NOT DETECTED NOT DETECTED Final   Escherichia coli DETECTED (A) NOT DETECTED Final    Comment: CRITICAL RESULT CALLED TO, READ BACK BY AND VERIFIED WITH: CHRISTINE KATSOUDAS AT 0762 07/01/18 SG    Klebsiella oxytoca  NOT DETECTED NOT DETECTED Final   Klebsiella pneumoniae NOT DETECTED NOT DETECTED Final   Proteus species NOT DETECTED NOT DETECTED Final   Serratia marcescens NOT DETECTED NOT DETECTED Final   Carbapenem resistance NOT DETECTED NOT DETECTED Final   Haemophilus influenzae NOT DETECTED NOT DETECTED Final   Neisseria meningitidis NOT DETECTED NOT DETECTED Final   Pseudomonas aeruginosa NOT DETECTED NOT DETECTED Final   Candida albicans NOT DETECTED NOT DETECTED Final   Candida glabrata NOT DETECTED NOT DETECTED Final   Candida krusei NOT DETECTED NOT DETECTED Final   Candida parapsilosis NOT DETECTED NOT DETECTED Final   Candida tropicalis NOT DETECTED NOT DETECTED Final    Comment: Performed at [facility], [address] [place], [place] [address]  MRSA PCR Screening     Status: None   Collection Time: 07/01/18  9:53 AM   Specimen: Nasopharyngeal  Result Value Ref Range Status   MRSA by PCR NEGATIVE NEGATIVE Final    Comment:        The GeneXpert MRSA Assay (FDA approved for NASAL specimens only), is one component of a comprehensive MRSA colonization surveillance program. It is not intended to diagnose MRSA infection nor to guide or monitor treatment for MRSA infections. Performed at [facility], [address] [place], [place] [address]   Culture, respiratory (non-expectorated)     Status: None   Collection Time: 07/02/18  6:48 PM   Specimen: Tracheal Aspirate; Respiratory  Result Value Ref Range Status   Specimen Description   Final    TRACHEAL ASPIRATE Performed at [facility], [address], [place], [place] [address]    Special Requests   Final    NONE Performed at [facility], [place],  [place] [address]    Gram Stain   Final    RARE WBC PRESENT, PREDOMINANTLY PMN NO ORGANISMS SEEN    Culture   Final    RARE Consistent with normal respiratory flora. Performed at [facility], [place] [address], [place], [place] [address]    Report Status 07/05/2018 FINAL  Final  Culture, blood (routine x 2)     Status: None (Preliminary result)   Collection Time: 07/03/18 12:51 AM   Specimen: BLOOD  Result Value Ref Range Status   Specimen Description BLOOD RIGHT HAND  Final   Special Requests   Final    BOTTLES DRAWN AEROBIC AND ANAEROBIC Blood Culture adequate volume   Culture   Final    NO GROWTH 2 DAYS Performed at [facility], [address] [place], [place] [address]    Report Status PENDING  Incomplete  Culture, blood (routine x 2)     Status: None (Preliminary result)   Collection Time: 07/03/18 12:58 AM   Specimen: BLOOD  Result Value Ref Range Status   Specimen Description BLOOD LEFT HAND  Final   Special Requests   Final    BOTTLES DRAWN AEROBIC AND ANAEROBIC Blood Culture adequate volume   Culture   Final    NO GROWTH 2 DAYS Performed at [facility], [address], [place], [place] [address]    Report Status PENDING  Incomplete    Coagulation Studies: No results for input(s): LABPROT, INR in the last 72 hours.  Urinalysis: No results for input(s): COLORURINE, LABSPEC, PHURINE, GLUCOSEU, HGBUR, BILIRUBINUR, KETONESUR, PROTEINUR, UROBILINOGEN, NITRITE, LEUKOCYTESUR in the last 72 hours.  Invalid input(s): APPERANCEUR    Imaging: Ct Chest Wo Contrast  Result Date: 07/04/2018 CLINICAL DATA:  48 year old male with a past medical history of tuberculosis and HIV. The patient currently has severe respiratory distress and findings concerning for right lower lobe pneumonia. EXAM: CT CHEST WITHOUT CONTRAST TECHNIQUE: Multidetector CT imaging of the chest was performed following the standard protocol without IV contrast. COMPARISON:  CT chest dated 03/18/2018 FINDINGS: Cardiovascular: The heart is enlarged. There are mild coronary artery calcifications. There is no significant pericardial effusion. Mediastinum/Nodes: There are mildly prominent mediastinal and  hilar lymph nodes. There are some prominent but subcentimeter axillary and supraclavicular lymph nodes. The thyroid gland is unremarkable. The esophagus is unremarkable. Lungs/Pleura: Emphysematous changes are noted bilaterally. Endotracheal tube terminates above the carina. There is debris within the right mainstem bronchus. There is a large area of consolidation involving virtually all of the right lower lobe. There are additional scattered areas of consolidation involving all remaining lobes. There is no pneumothorax. There is no significant pleural effusion. The previously demonstrated cavitary area within the right upper lobe has improved. Upper Abdomen: No acute abnormality. Musculoskeletal: No chest wall mass or suspicious bone lesions identified. IMPRESSION: 1. There is consolidation involving nearly all of the right lower lobe. There are additional smaller areas of consolidation involving all remaining lobes. Findings are consistent with multifocal pneumonia. 2. Interval decrease in size of the previously demonstrated cavitary lesion in the right upper lobe. 3. No pneumothorax or significant pleural effusion. 4. Mild cardiomegaly. 5. Emphysema. Emphysema (ICD10-J43.9). Electronically Signed   By: Constance Holster M.D.   On: 07/04/2018 19:21     Medications:   . sodium chloride Stopped (07/04/18 1446)  . amiodarone 30 mg/hr (07/05/18 1100)  .  ceFAZolin (ANCEF) IV 1 g (07/05/18 1424)  . dexmedetomidine (PRECEDEX) IV infusion 1.2 mcg/kg/hr (07/05/18 1330)  .  fentaNYL infusion INTRAVENOUS 150 mcg/hr (07/05/18 1202)  . norepinephrine (LEVOPHED) Adult infusion Stopped (07/05/18 6816)  . propofol (DIPRIVAN) infusion Stopped (07/04/18 6196)  . vasopressin (PITRESSIN) infusion - *FOR SHOCK* Stopped (07/04/18 0604)   . B-complex with vitamin C  1 tablet Per Tube QHS  . chlorhexidine gluconate (MEDLINE KIT)  15 mL Mouth Rinse BID  . [START ON 07/06/2018] Chlorhexidine Gluconate Cloth  6 each Topical  Q0600  . docusate sodium  100 mg Oral BID  . feeding supplement (VITAL HIGH PROTEIN)  1,000 mL Per Tube Q24H  . heparin injection (subcutaneous)  5,000 Units Subcutaneous Q12H  . hydrocortisone sod succinate (SOLU-CORTEF) inj  50 mg Intravenous Q6H  . insulin aspart  0-15 Units Subcutaneous Q4H  . ipratropium-albuterol  3 mL Nebulization Q4H  . mouth rinse  15 mL Mouth Rinse 10 times per day  . pantoprazole (PROTONIX) IV  40 mg Intravenous Q24H  . senna  1 tablet Oral Daily  . sodium chloride flush  10-40 mL Intracatheter Q12H   sodium chloride, acetaminophen **OR** acetaminophen, alteplase, heparin, ondansetron **OR** ondansetron (ZOFRAN) IV, sodium chloride flush, traZODone  Assessment/ Plan:  48 y.o. male with a PMHx of HIV currently not on treatment, tuberculosis, history of abscess drainage, who was admitted to [facility] on 07/01/2018 for evaluation of altered mental status and unresponsiveness.  Patient admitted with severe right lower lobe pneumonia, acute renal failure, and respiratory failure.  1.  Acute renal failure secondary to acute tubular necrosis.    Patient remains critically ill.  However his metabolic parameters have improved significantly.  Therefore we will discontinue CRRT at this time.  We will proceed with dialysis session today.  Continue to monitor renal parameters over the course of the next several days.  2.  Septic shock/hypotension.    Patient is off all pressor therapy at the moment.  Continue to monitor blood pressure trend closely.  3.  Metabolic acidosis.    Serum bicarbonate remained stable.  Lactic acid was 2.3 earlier this a.m.  Improved significantly with dialysis treatments.  4.  Acute respiratory failure secondary to right lower lobe pneumonia.  Plans for extubation per pulmonary/critical care.   LOS: 4 Byrdie Miyazaki 6/13/20202:52 PM

## 2018-07-05 NOTE — Progress Notes (Signed)
Post HD Tx   1.0 liter removal     07/05/18 1845  Vital Signs  Pulse Rate 76  Resp 18  BP 109/73  Oxygen Therapy  SpO2 96 %  End Tidal CO2 (EtCO2) 35  Post-Hemodialysis Assessment  Rinseback Volume (mL) 250 mL  Dialyzer Clearance Lightly streaked  Duration of HD Treatment -hour(s) 3 hour(s)  Hemodialysis Intake (mL) 500 mL  UF Total -Machine (mL) 1500 mL  Net UF (mL) 1000 mL  Tolerated HD Treatment Yes  Hemodialysis Catheter Left Femoral vein Triple lumen Temporary  Placement Date/Time: 07/02/18 1633   Placed prior to admission: No  Person Inserting Catheter: Hinton Dyer, NP  Orientation: Left  Access Location: Femoral vein  Hemodialysis Catheter Type: Triple lumen Temporary  Site Condition No complications  Blue Lumen Status Flushed;Capped (Central line);Heparin locked  Red Lumen Status Flushed;Capped (Central line);Heparin locked  Catheter fill solution Heparin 1000 units/ml  Catheter fill volume (Arterial) 1.4 cc  Catheter fill volume (Venous) 1.4  Dressing Type Biopatch;Occlusive  Dressing Status Clean;Dry;Intact  Drainage Description None  Dressing Change Due 07/09/18  Post treatment catheter status Capped and Clamped

## 2018-07-06 ENCOUNTER — Inpatient Hospital Stay: Payer: Medicaid Other

## 2018-07-06 LAB — LACTIC ACID, PLASMA: Lactic Acid, Venous: 1.4 mmol/L (ref 0.5–1.9)

## 2018-07-06 LAB — GLUCOSE, CAPILLARY
Glucose-Capillary: 169 mg/dL — ABNORMAL HIGH (ref 70–99)
Glucose-Capillary: 152 mg/dL — ABNORMAL HIGH (ref 70–99)
Glucose-Capillary: 138 mg/dL — ABNORMAL HIGH (ref 70–99)
Glucose-Capillary: 147 mg/dL — ABNORMAL HIGH (ref 70–99)
Glucose-Capillary: 157 mg/dL — ABNORMAL HIGH (ref 70–99)

## 2018-07-06 LAB — COMPREHENSIVE METABOLIC PANEL
ALT: 21 U/L (ref 0–44)
AST: 92 U/L — ABNORMAL HIGH (ref 15–41)
Albumin: 1.8 g/dL — ABNORMAL LOW (ref 3.5–5.0)
Alkaline Phosphatase: 116 U/L (ref 38–126)
Anion gap: 9 (ref 5–15)
BUN: 44 mg/dL — ABNORMAL HIGH (ref 6–20)
CO2: 26 mmol/L (ref 22–32)
Calcium: 7.7 mg/dL — ABNORMAL LOW (ref 8.9–10.3)
Chloride: 100 mmol/L (ref 98–111)
Creatinine, Ser: 1.97 mg/dL — ABNORMAL HIGH (ref 0.61–1.24)
GFR calc Af Amer: 45 mL/min — ABNORMAL LOW (ref >60)
GFR calc non Af Amer: 39 mL/min — ABNORMAL LOW (ref >60)
Glucose, Bld: 178 mg/dL — ABNORMAL HIGH (ref 70–99)
Potassium: 4.5 mmol/L (ref 3.5–5.1)
Sodium: 135 mmol/L (ref 135–145)
Total Bilirubin: 3 mg/dL — ABNORMAL HIGH (ref 0.3–1.2)
Total Protein: 5.9 g/dL — ABNORMAL LOW (ref 6.5–8.1)

## 2018-07-06 LAB — CBC
HCT: 30.4 % — ABNORMAL LOW (ref 39.0–52.0)
Hemoglobin: 10.7 g/dL — ABNORMAL LOW (ref 13.0–17.0)
MCH: 33.4 pg (ref 26.0–34.0)
MCHC: 35.2 g/dL (ref 30.0–36.0)
MCV: 95 fL (ref 80.0–100.0)
Platelets: 111 10*3/uL — ABNORMAL LOW (ref 150–400)
RBC: 3.2 MIL/uL — ABNORMAL LOW (ref 4.22–5.81)
RDW: 15.6 % — ABNORMAL HIGH (ref 11.5–15.5)
WBC: 18.9 10*3/uL — ABNORMAL HIGH (ref 4.0–10.5)
nRBC: 0.2 % (ref 0.0–0.2)

## 2018-07-06 LAB — MAGNESIUM: Magnesium: 2 mg/dL (ref 1.7–2.4)

## 2018-07-06 MED ORDER — VITAL HIGH PROTEIN PO LIQD
1000.0000 mL | ORAL | Status: AC
  Administered 2018-07-07: 1000 mL

## 2018-07-06 NOTE — Progress Notes (Signed)
Routine Rounds. Offered family support. Family present (mother and former wife, legally married) in hopes voice would arouse Rohil. Chaplain offered emotional support to family. Family is clear they do not want [place] to suffer. Sister in [place] is offering support to family and is key in decision making.     07/06/18 1400  Clinical Encounter Type  Visited With Patient and family together  Visit Type Initial;Critical Care  Spiritual Encounters  Spiritual Needs Emotional

## 2018-07-06 NOTE — Progress Notes (Signed)
[facility] NAME: Joshua Dougherty    MR#:  [id number]  DATE OF BIRTH:  [birth year]  SUBJECTIVE:  CHIEF COMPLAINT:   Chief Complaint  Patient presents with  . Altered Mental Status  . Respiratory Distress   No new complaints reported by nursing staff.  Patient remains sedated on the vent.  REVIEW OF SYSTEMS:  ROS Unobtainable due to patient being on the vent. DRUG ALLERGIES:  No Known Allergies VITALS:  Blood pressure 121/84, pulse 80, temperature 97.7 F (36.5 C), temperature source Axillary, resp. rate 18, height 6' (1.829 m), weight 73.9 kg, SpO2 96 %. PHYSICAL EXAMINATION:  Physical Exam  GENERAL:  48 y.o.-year-old patient lying in the bed critically ill.    Sedated on the vent  EYES: Pupils equal, round, reactive to light . No scleral icterus.   HEENT: Head atraumatic, normocephalic.  Orally intubated.   NECK:  Supple, no jugular venous distention. No thyroid enlargement, no tenderness.  LUNGS: Coarse breath sounds bilaterally.Marland Kitchen  CARDIOVASCULAR: S1, S2 normal. No murmurs, rubs, or gallops.  ABDOMEN: Soft, nontender, nondistended. Bowel sounds present. No organomegaly or mass.  EXTREMITIES: No pedal edema, cyanosis, or clubbing.  NEUROLOGIC: Intubated, sedated   pSYCHIATRIC sedated SKIN: No obvious rash, lesion, or ulcer.   LABORATORY PANEL:  Male CBC Recent Labs  Lab 07/06/18 0404  WBC 18.9*  HGB 10.7*  HCT 30.4*  PLT 111*   ------------------------------------------------------------------------------------------------------------------ Chemistries  Recent Labs  Lab 07/06/18 0404  NA 135  K 4.5  CL 100  CO2 26  GLUCOSE 178*  BUN 44*  CREATININE 1.97*  CALCIUM 7.7*  MG 2.0  AST 92*  ALT 21  ALKPHOS 116  BILITOT 3.0*   RADIOLOGY:  Dg Chest Port 1 View  Result Date: 07/06/2018 CLINICAL DATA:  Respiratory failure. Altered mental status. EXAM: PORTABLE CHEST 1 VIEW COMPARISON:  Chest radiograph  07/03/2018 and CT 07/04/2018 FINDINGS: Endotracheal tube terminates at the clavicular heads. Enteric tube has been removed since the prior radiographs were obtained. The cardiomediastinal silhouette is unchanged. There is persistent consolidation throughout the right lower lobe. They are is also patchy right upper lobe and retrocardiac left lower lobe opacity as seen on CT. No pneumothorax is identified. IMPRESSION: Unchanged pneumonia with extensive right lower lobe involvement. Electronically Signed   By: Logan Bores M.D.   On: 07/06/2018 07:37   ASSESSMENT AND PLAN:   48 year old male with history of AIDS, latent TB admitted for acute respiratory failure and septic shock  Acute respiratory failure due to septic shock, currently on full vent support, restarted the pressors.  Appreciate intensivist, ID following.  #1 .septic shock secondary to bilateral pneumonia, patient has polymicrobial bacteremia with staph aureus, E. coli, GBS source likely lung.,    Patient currently on Ancef.  Continue vasopressors repeat blood cultures no growth.  Leukocytosis gradually improving.  No fever.  2.  Acute respiratory failure, patient is intubated,, sedated.  History of cavitary pneumonia, admitted in February that time post sputum AFB that  were sent, which were negative, patient also had bronc following discharge which were negative for AFB.  3 .  Acute renal failure with ATN secondary to septic shock, ; renal function not improved yet.,  Patient was initially placed on CRRT by nephrology.  This was discontinued previously and patient started on on hemodialysis on 07/05/2018.    #4. HIV, recent CD4 count 403, not on [place] as not engaged in the care as per  ID documentation. Prognosis guarded, high risk for cardiac arrest.    DVT prophylaxis; heparin  All the records are reviewed and case discussed with Care Management/Social Worker. Management plans discussed with the patient, family and they are in  agreement.  CODE STATUS: DNR  TOTAL TIME TAKING CARE OF THIS PATIENT: 37 minutes.   More than 50% of the time was spent in counseling/coordination of care: YES  POSSIBLE D/C IN 3 DAYS, DEPENDING ON CLINICAL CONDITION.   Joshua Dougherty M.D on 07/06/2018 at 1:27 PM  Between 7am to 6pm - Pager - [phone]  After 6pm go to [email or web address] - Proofreader  [facility]  Office  [phone]  CC: Primary care physician; Patient, No Pcp Per  Note: This dictation was prepared with Dragon dictation along with smaller phrase technology. Any transcriptional errors that result from this process are unintentional.

## 2018-07-06 NOTE — Progress Notes (Signed)
Legal wife (seperated) and mom of the patient visited today while he was having his wake up assessment.  He did not do well breathing on his own, so everything was restarted.    A fax was sent on his behalf because he was unable to appear for a court date.  Copy place in record and given to his wife.  Patient was more peaceful today than yesterday when he was agitated and stressed.  Phillis Knack, RN

## 2018-07-06 NOTE — Progress Notes (Signed)
CRITICAL CARE NOTE  SYNOPSIS 48 yo male with a PMH of Tuberculosis(dx 02/2018 underwent bronchoscopy for sampling on 03/28/2018 by Dr. Mattie Marlin HIV (currently not on treatment). He presented to [facility] ER on 06/9 via EMS withsevere respiratory distress.  Per ER notes pts family called the sheriff's department to perform a wellness check because they had not heard from the ptin several days. Thefire department found the pt confused and lying supine on the ground withc/odifficulty breathing. -Pt placed on NRB en route to the ER. Upon arrival to the ER pt transitioned to Bipap due to continued respiratory failure. Lab results revealed Na+ 131, CO2 12, lactic acid 3.6, COVID-19 negative, and vbg pH 7.23/pCO2 35. -CXR concerning for right lower lobe airspace opacity likely secondary topneumonia and probable small right-sided parapneumonic effusion. -received 2L NS bolus, vancomycin, cefepime, and clindamycin. -intubation in the emergency room. -transient arrest of less than a minute where he became pulseless and required epinephrine and atropine. He has been pressor dependent since that episode. Now requiring norepinephrine and vasopressin.    CC  follow up respiratory failure  SUBJECTIVE Patient remains critically ill Prognosis is guarded Off CRRT Off vasopressors Failed multiple weaning trials +multiorgan failure Severe resp failure  Vent Mode: PRVC FiO2 (%):  [30 %-40 %] 40 % Set Rate:  [9 bmp-18 bmp] 9 bmp Vt Set:  [500 mL] 500 mL PEEP:  [5 cmH20] 5 cmH20 Plateau Pressure:  [14 cmH20] 14 cmH20   BP 95/74 (BP Location: Right Arm)   Pulse 61   Temp 97.7 F (36.5 C) (Axillary)   Resp 19   Ht 6' (1.829 m)   Wt 73.9 kg   SpO2 98%   BMI 22.10 kg/m    I/O last 3 completed shifts: In: 2236 [I.V.:2135.9; IV Piggyback:100] Out: 1000 [Other:1000] No intake/output data recorded.  SpO2: 98 % FiO2 (%): 40 %  SIGNIFICANT EVENTS 6/9 admitted for severe  resp failure/SHock, E. COLI BACTEREMIA, GRAM +COCCI bacteremia 6/10 multiorgan failure, severe septic shock 6/11 off CRRT, resp failure, shock 6/12 failed weaning trials CT chest RLL opacity c/w pneumonia 6/13 failed weaning trials, resp muscle fatigue  REVIEW OF SYSTEMS  PATIENT IS UNABLE TO PROVIDE COMPLETE REVIEW OF SYSTEMS DUE TO SEVERE CRITICAL ILLNESS   PHYSICAL EXAMINATION:  GENERAL:critically ill appearing, +resp distress HEAD: Normocephalic, atraumatic.  EYES: Pupils equal, round, reactive to light.  No scleral icterus.  MOUTH: Moist mucosal membrane. NECK: Supple. No thyromegaly. No nodules. No JVD.  PULMONARY: +rhonchi, +wheezing CARDIOVASCULAR: S1 and S2. Regular rate and rhythm. No murmurs, rubs, or gallops.  GASTROINTESTINAL: Soft, nontender, -distended. No masses. Positive bowel sounds. No hepatosplenomegaly.  MUSCULOSKELETAL: No swelling, clubbing, or edema.  NEUROLOGIC: obtunded, GCS<8 SKIN:intact,warm,dry  MEDICATIONS: I have reviewed all medications and confirmed regimen as documented   CULTURE RESULTS   Recent Results (from the past 240 hour(s))  SARS Coronavirus 2 (CEPHEID- Performed in [facility] hospital lab), Hosp Order     Status: None   Collection Time: 07/01/18  1:19 AM   Specimen: Nasopharyngeal Swab  Result Value Ref Range Status   SARS Coronavirus 2 NEGATIVE NEGATIVE Final    Comment: (NOTE) If result is NEGATIVE SARS-CoV-2 target nucleic acids are NOT DETECTED. The SARS-CoV-2 RNA is generally detectable in upper and lower  respiratory specimens during the acute phase of infection. The lowest  concentration of SARS-CoV-2 viral copies this assay can detect is 250  copies / mL. A negative result does not preclude SARS-CoV-2 infection  and should not  be used as the sole basis for treatment or other  patient management decisions.  A negative result may occur with  improper specimen collection / handling, submission of specimen other  than  nasopharyngeal swab, presence of viral mutation(s) within the  areas targeted by this assay, and inadequate number of viral copies  (<250 copies / mL). A negative result must be combined with clinical  observations, patient history, and epidemiological information. If result is POSITIVE SARS-CoV-2 target nucleic acids are DETECTED. The SARS-CoV-2 RNA is generally detectable in upper and lower  respiratory specimens dur ing the acute phase of infection.  Positive  results are indicative of active infection with SARS-CoV-2.  Clinical  correlation with patient history and other diagnostic information is  necessary to determine patient infection status.  Positive results do  not rule out bacterial infection or co-infection with other viruses. If result is PRESUMPTIVE POSTIVE SARS-CoV-2 nucleic acids MAY BE PRESENT.   A presumptive positive result was obtained on the submitted specimen  and confirmed on repeat testing.  While 2019 novel coronavirus  (SARS-CoV-2) nucleic acids may be present in the submitted sample  additional confirmatory testing may be necessary for epidemiological  and / or clinical management purposes  to differentiate between  SARS-CoV-2 and other Sarbecovirus currently known to infect humans.  If clinically indicated additional testing with an alternate test  methodology [phone]) is advised. The SARS-CoV-2 RNA is generally  detectable in upper and lower respiratory sp ecimens during the acute  phase of infection. The expected result is Negative. Fact Sheet for Patients:  StrictlyIdeas.no Fact Sheet for Healthcare Providers: BankingDealers.co.za This test is not yet approved or cleared by the [place] FDA and has been authorized for detection and/or diagnosis of SARS-CoV-2 by FDA under an Emergency Use Authorization (EUA).  This EUA will remain in effect (meaning this test can be used) for the duration of the  COVID-19 declaration under Section 564(b)(1) of the Act, 21 U.S.C. section 360bbb-3(b)(1), unless the authorization is terminated or revoked sooner. Performed at [facility], [address] [place], [place] [address]   Blood culture (routine x 2)     Status: Abnormal   Collection Time: 07/01/18  1:19 AM   Specimen: BLOOD  Result Value Ref Range Status   Specimen Description   Final    BLOOD LEFT ANTECUBITAL Performed at [facility], [place] [address], [place], [place] [address]    Special Requests   Final    BOTTLES DRAWN AEROBIC AND ANAEROBIC Blood Culture results may not be optimal due to an excessive volume of blood received in culture bottles Performed at [facility], [address] [place], [place] [address]    Culture  Setup Time   Final    GRAM NEGATIVE RODS IN BOTH AEROBIC AND ANAEROBIC BOTTLES CRITICAL RESULT CALLED TO, READ BACK BY AND VERIFIED WITH: CHRISTINE KATSOUDAS AT 8588 07/01/18 SG Performed at [facility], [address] [place], [place] [address]    Culture ESCHERICHIA COLI (A)  Final   Report Status 07/03/2018 FINAL  Final   Organism ID, Bacteria ESCHERICHIA COLI  Final      Susceptibility   Escherichia coli - MIC*    AMPICILLIN 4 SENSITIVE Sensitive     CEFAZOLIN <=4 SENSITIVE Sensitive     CEFEPIME <=1 SENSITIVE Sensitive     CEFTAZIDIME <=1 SENSITIVE Sensitive     CEFTRIAXONE <=1 SENSITIVE Sensitive     CIPROFLOXACIN <=0.25 SENSITIVE Sensitive  GENTAMICIN <=1 SENSITIVE Sensitive     IMIPENEM <=0.25 SENSITIVE Sensitive     TRIMETH/SULFA <=20 SENSITIVE Sensitive     AMPICILLIN/SULBACTAM <=2 SENSITIVE Sensitive     PIP/TAZO <=4 SENSITIVE Sensitive     Extended ESBL NEGATIVE Sensitive     * ESCHERICHIA COLI  Blood culture (routine x 2)     Status: Abnormal   Collection Time: 07/01/18  3:09 AM   Specimen: BLOOD  Result Value Ref Range Status   Specimen Description   Final    BLOOD LEFT ARM Performed at  [facility], [address], [place], [place] [address]    Special Requests   Final    BOTTLES DRAWN AEROBIC AND ANAEROBIC Blood Culture adequate volume Performed at [place] [place], [place] [address]    Culture  Setup Time   Final    GRAM POSITIVE COCCI IN BOTH AEROBIC AND ANAEROBIC BOTTLES GRAM NEGATIVE RODS IN BOTH AEROBIC AND ANAEROBIC BOTTLES CRITICAL RESULT CALLED TO, READ BACK BY AND VERIFIED WITH: CHRISTINE KATSOUDAS AT 0223 07/01/18 SG Performed at [facility], [place] [address], [place],  [address]    Culture (A)  Final    ESCHERICHIA COLI ORGANISM 1 SUSCEPTIBILITIES PERFORMED ON PREVIOUS CULTURE WITHIN THE LAST 5 DAYS. STAPHYLOCOCCUS AUREUS VIRIDANS STREPTOCOCCUS THE SIGNIFICANCE OF ISOLATING THIS ORGANISM FROM A SINGLE SET OF BLOOD CULTURES WHEN MULTIPLE SETS ARE DRAWN IS UNCERTAIN. PLEASE NOTIFY THE MICROBIOLOGY DEPARTMENT WITHIN ONE WEEK IF SPECIATION AND SENSITIVITIES ARE REQUIRED. GROUP B STREP(S.AGALACTIAE)ISOLATED    Report Status 07/04/2018 FINAL  Final   Organism ID, Bacteria STAPHYLOCOCCUS AUREUS  Final   Organism ID, Bacteria GROUP B STREP(S.AGALACTIAE)ISOLATED  Final      Susceptibility   Group b strep(s.agalactiae)isolated - MIC*    CLINDAMYCIN >=1 RESISTANT Resistant     AMPICILLIN <=0.25 SENSITIVE Sensitive     ERYTHROMYCIN >=8 RESISTANT Resistant     VANCOMYCIN 0.5 SENSITIVE Sensitive     CEFTRIAXONE <=0.12 SENSITIVE Sensitive     LEVOFLOXACIN 1 SENSITIVE Sensitive     * GROUP B STREP(S.AGALACTIAE)ISOLATED   Staphylococcus aureus - MIC*    CIPROFLOXACIN 1 SENSITIVE Sensitive     ERYTHROMYCIN >=8 RESISTANT Resistant     GENTAMICIN <=0.5 SENSITIVE Sensitive     OXACILLIN 0.5 SENSITIVE Sensitive     TETRACYCLINE <=1 SENSITIVE Sensitive     VANCOMYCIN 1 SENSITIVE Sensitive     TRIMETH/SULFA <=10 SENSITIVE Sensitive     CLINDAMYCIN RESISTANT Resistant     RIFAMPIN <=0.5 SENSITIVE Sensitive      Inducible Clindamycin POSITIVE Resistant     * STAPHYLOCOCCUS AUREUS  Blood Culture ID Panel (Reflexed)     Status: Abnormal   Collection Time: 07/01/18  3:09 AM  Result Value Ref Range Status   Enterococcus species NOT DETECTED NOT DETECTED Final   Listeria monocytogenes NOT DETECTED NOT DETECTED Final   Staphylococcus species DETECTED (A) NOT DETECTED Final    Comment: CRITICAL RESULT CALLED TO, READ BACK BY AND VERIFIED WITH: CHRISTINE KATSOUDAS AT 1325 07/01/18 SG    Staphylococcus aureus (BCID) DETECTED (A) NOT DETECTED Final    Comment: Methicillin (oxacillin) susceptible Staphylococcus aureus (MSSA). Preferred therapy is anti staphylococcal beta lactam antibiotic (Cefazolin or Nafcillin), unless clinically contraindicated. CRITICAL RESULT CALLED TO, READ BACK BY AND VERIFIED WITH: CHRISTINE KATSOUDAS AT 4497 07/01/18 SG    Methicillin resistance NOT DETECTED NOT DETECTED Final   Streptococcus species DETECTED (A) NOT DETECTED Final    Comment: CRITICAL  RESULT CALLED TO, READ BACK BY AND VERIFIED WITH: CHRISTINE KATSOUDAS AT 8676 07/01/18 SG    Streptococcus agalactiae DETECTED (A) NOT DETECTED Final    Comment: CRITICAL RESULT CALLED TO, READ BACK BY AND VERIFIED WITH: CHRISTINE KATSOUDAS AT 1950 07/01/18 SG    Streptococcus pneumoniae NOT DETECTED NOT DETECTED Final   Streptococcus pyogenes NOT DETECTED NOT DETECTED Final   Acinetobacter baumannii NOT DETECTED NOT DETECTED Final   Enterobacteriaceae species DETECTED (A) NOT DETECTED Final    Comment: Enterobacteriaceae represent a large family of gram-negative bacteria, not a single organism. CRITICAL RESULT CALLED TO, READ BACK BY AND VERIFIED WITH: CHRISTINE KATSOUDAS AT 9326 07/01/18 SG    Enterobacter cloacae complex NOT DETECTED NOT DETECTED Final   Escherichia coli DETECTED (A) NOT DETECTED Final    Comment: CRITICAL RESULT CALLED TO, READ BACK BY AND VERIFIED WITH: CHRISTINE KATSOUDAS AT 7124 07/01/18 SG    Klebsiella oxytoca  NOT DETECTED NOT DETECTED Final   Klebsiella pneumoniae NOT DETECTED NOT DETECTED Final   Proteus species NOT DETECTED NOT DETECTED Final   Serratia marcescens NOT DETECTED NOT DETECTED Final   Carbapenem resistance NOT DETECTED NOT DETECTED Final   Haemophilus influenzae NOT DETECTED NOT DETECTED Final   Neisseria meningitidis NOT DETECTED NOT DETECTED Final   Pseudomonas aeruginosa NOT DETECTED NOT DETECTED Final   Candida albicans NOT DETECTED NOT DETECTED Final   Candida glabrata NOT DETECTED NOT DETECTED Final   Candida krusei NOT DETECTED NOT DETECTED Final   Candida parapsilosis NOT DETECTED NOT DETECTED Final   Candida tropicalis NOT DETECTED NOT DETECTED Final    Comment: Performed at [facility], [address] [place], [place] [address]  MRSA PCR Screening     Status: None   Collection Time: 07/01/18  9:53 AM   Specimen: Nasopharyngeal  Result Value Ref Range Status   MRSA by PCR NEGATIVE NEGATIVE Final    Comment:        The GeneXpert MRSA Assay (FDA approved for NASAL specimens only), is one component of a comprehensive MRSA colonization surveillance program. It is not intended to diagnose MRSA infection nor to guide or monitor treatment for MRSA infections. Performed at [facility], [address] [place], [place] [address]   Culture, respiratory (non-expectorated)     Status: None   Collection Time: 07/02/18  6:48 PM   Specimen: Tracheal Aspirate; Respiratory  Result Value Ref Range Status   Specimen Description   Final    TRACHEAL ASPIRATE Performed at [facility], [address] [place], [place] [address]    Special Requests   Final    NONE Performed at [facility], [address] [place], [place] [address]    Gram Stain   Final    RARE WBC PRESENT, PREDOMINANTLY PMN NO ORGANISMS SEEN    Culture   Final    RARE Consistent with normal respiratory flora. Performed at [facility], [place] [address], [place], [place] [address]    Report Status 07/05/2018 FINAL  Final  Culture, blood (routine x 2)     Status: None (Preliminary result)   Collection Time: 07/03/18 12:51 AM   Specimen: BLOOD  Result Value Ref Range Status   Specimen Description BLOOD RIGHT HAND  Final   Special Requests   Final    BOTTLES DRAWN AEROBIC AND ANAEROBIC Blood Culture adequate volume   Culture   Final    NO GROWTH 3 DAYS Performed at [facility], [address] [place], [place] [address]    Report Status PENDING  Incomplete  Culture, blood (routine x 2)     Status: None (Preliminary result)   Collection Time: 07/03/18 12:58 AM   Specimen: BLOOD  Result Value Ref Range Status   Specimen Description BLOOD LEFT HAND  Final   Special Requests   Final    BOTTLES DRAWN AEROBIC AND ANAEROBIC Blood Culture adequate volume   Culture   Final    NO GROWTH 3 DAYS Performed at [facility], [address] [place], [place] [address]    Report Status PENDING  Incomplete          IMAGING    Dg Chest Port 1 View  Result Date: 07/06/2018 CLINICAL DATA:  Respiratory failure. Altered mental status. EXAM: PORTABLE CHEST 1 VIEW COMPARISON:  Chest radiograph 07/03/2018 and CT 07/04/2018 FINDINGS: Endotracheal tube terminates at the clavicular heads. Enteric tube has been removed since the prior radiographs were obtained. The cardiomediastinal silhouette is unchanged. There is persistent consolidation throughout the right lower lobe. They are is also patchy right upper lobe and retrocardiac left lower lobe opacity as seen on CT. No pneumothorax is identified. IMPRESSION: Unchanged pneumonia with extensive right lower lobe involvement. Electronically Signed   By: Logan Bores M.D.   On: 07/06/2018 07:37      The CT chest 07/05/18 Independently Reviewed By Me Today RT lung opacity c/w pneumonia     Indwelling Urinary Catheter continued, requirement due to   Reason to continue Indwelling Urinary  Catheter strict Intake/Output monitoring for hemodynamic instability   Central Line/ continued, requirement due to  Reason to continue [place] of central venous pressure or other hemodynamic parameters and poor IV access   Ventilator continued, requirement due to severe respiratory failure   Ventilator Sedation RASS 0 to -2      ASSESSMENT AND PLAN SYNOPSIS  Severe Septic shock from E coli/MSSA/GBS bacteremia from pneumonia and UTI with progressive multiorgan failure/renal failure/resp failure findings to suggest 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 and Nephrology Failed multiple trial due to severe pneumonia and severe resp muscle fatigue   Severe ACUTE Hypoxic and Hypercapnic Respiratory Failure -continue Full MV support -continue Bronchodilator Therapy -Wean Fio2 and PEEP as tolerated -will perform SAT/SBT when respiratory parameters are met when family arrives   ACUTE KIDNEY INJURY/Renal Failure -follow chem 7 -follow UO -continue Foley Catheter-assess need -Avoid nephrotoxic agents -Recheck creatinine  HD as needed   NEUROLOGY - intubated and sedated - minimal sedation to achieve a RASS goal: -1 Wake up assessment pending   SHOCK-SEPSIS/HYPOVOLUMIC/CARDIOGENIC -use vasopressors to keep MAP>65 -follow ABG and LA -follow up cultures  CARDIAC ICU monitoring  ID -continue IV abx as prescibed -follow up cultures Follow up ID recs  GI GI PROPHYLAXIS as indicated  NUTRITIONAL STATUS DIET-->TF's as tolerated Constipation protocol as indicated  ENDO - will use ICU hypoglycemic\Hyperglycemia protocol if indicated   ELECTROLYTES -follow labs as needed -replace as needed -pharmacy consultation and following   DVT/GI PRX ordered TRANSFUSIONS AS NEEDED MONITOR FSBS ASSESS the need for LABS as needed   Critical Care Time devoted to patient care services described in this note is 34 minutes.   Overall,  patient is critically ill, prognosis is guarded.  Patient with Multiorgan failure and at high risk for cardiac arrest and death.   Patient is DNR  Corrin Parker, M.D.  [facility]  Medical Director [place]  Medical Director [place] Department

## 2018-07-06 NOTE — Progress Notes (Signed)
[facility]  ROUNDING NOTE   Subjective:  Patient was taken off of CRRT yesterday. He did undergo dialysis yesterday as well. Still remains critically ill. Patient remains on the ventilator.  Objective:  Vital signs in last 24 hours:  Temp:  [97.7 F (36.5 C)-98.2 F (36.8 C)] 97.7 F (36.5 C) (06/14 0800) Pulse Rate:  [61-86] 66 (06/14 1100) Resp:  [9-31] 18 (06/14 1100) BP: (76-123)/(47-86) 100/75 (06/14 1100) SpO2:  [87 %-98 %] 98 % (06/14 1113) FiO2 (%):  [30 %-40 %] 40 % (06/14 1113) Weight:  [71.7 kg-73.9 kg] 73.9 kg (06/14 0423)  Weight change: 0 kg Filed Weights   07/05/18 1525 07/05/18 1830 07/06/18 0423  Weight: 72.7 kg 71.7 kg 73.9 kg    Intake/Output: I/O last 3 completed shifts: In: 2236 [I.V.:2135.9; IV Piggyback:100] Out: 1000 [Other:1000]   Intake/Output this shift:  No intake/output data recorded.  Physical Exam: General: Critically ill-appearing  Head: Severe temporal wasting  Eyes: Anicteric  Neck: Supple, trachea midline  Lungs:  Basilar rales, vent assisted  Heart: S1S2 no rubs  Abdomen:  Soft, nontender, bowel sounds present  Extremities: no peripheral edema.  Neurologic: sedated  Skin: No lesions  Access: Left femoral temporary dialysis catheter    Basic Metabolic Panel: Recent Labs  Lab 07/04/18 1531 07/04/18 1949 07/05/18 0003 07/05/18 0324 07/05/18 0746 07/06/18 0404  NA 135 133* 135 134* 135 135  K 4.0 4.2 4.4 4.4 4.5 4.5  CL 102 101 103 102 103 100  CO2 25 23 24 25 26 26   GLUCOSE 130* 184* 129* 124* 122* 178*  BUN 36* 35* 32* 29* 31* 44*  CREATININE 1.65* 1.65* 1.51* 1.27* 1.35* 1.97*  CALCIUM 7.9* 7.7* 8.1* 7.9* 8.1* 7.7*  MG 2.0 2.1 2.0 1.9 1.9 2.0  PHOS 2.1* 2.6 2.7 2.5 2.4*  --     Liver Function Tests: Recent Labs  Lab 07/01/18 0206  07/04/18 1949 07/05/18 0003 07/05/18 0324 07/05/18 0746 07/06/18 0404  AST 83*  --   --   --   --   --  92*  ALT 18  --   --   --   --   --  21  ALKPHOS 128*   --   --   --   --   --  116  BILITOT QUANTITY NOT SUFFICIENT, UNABLE TO PERFORM TEST  --   --   --   --   --  3.0*  PROT 7.7  --   --   --   --   --  5.9*  ALBUMIN QUANTITY NOT SUFFICIENT, UNABLE TO PERFORM TEST   < > 1.6* 1.6* 1.6* 1.6* 1.8*   < > = values in this interval not displayed.   No results for input(s): LIPASE, AMYLASE in the last 168 hours. No results for input(s): AMMONIA in the last 168 hours.  CBC: Recent Labs  Lab 07/01/18 1400  07/02/18 1151 07/03/18 0345 07/04/18 0340 07/05/18 0447 07/06/18 0404  WBC 5.0   < > 16.0* 26.2* 24.4* 21.8* 18.9*  NEUTROABS 4.2  --  14.4* 19.6* 21.0* 18.8*  --   HGB 13.2   < > 12.4* 11.4* 9.3* 10.7* 10.7*  HCT 39.7   < > 33.7* 31.3* 26.1* 30.3* 30.4*  MCV 100.3*   < > 92 92.3 92.6 94.7 95.0  PLT 236   < > 114* 113* 86* 100* 111*   < > = values in this interval not displayed.    Cardiac  Enzymes: Recent Labs  Lab 07/01/18 0206 07/01/18 0431  CKTOTAL  --  220  TROPONINI QUANTITY NOT SUFFICIENT, UNABLE TO PERFORM TEST 0.05*    BNP: Invalid input(s): POCBNP  CBG: Recent Labs  Lab 07/05/18 1956 07/05/18 2351 07/06/18 0416 07/06/18 0734 07/06/18 1231  GLUCAP 122* 142* 169* 152* 138*    Microbiology: Results for orders placed or performed during the hospital encounter of 07/01/18  SARS Coronavirus 2 (CEPHEID- Performed in [facility] hospital lab), Hosp Order     Status: None   Collection Time: 07/01/18  1:19 AM   Specimen: Nasopharyngeal Swab  Result Value Ref Range Status   SARS Coronavirus 2 NEGATIVE NEGATIVE Final    Comment: (NOTE) If result is NEGATIVE SARS-CoV-2 target nucleic acids are NOT DETECTED. The SARS-CoV-2 RNA is generally detectable in upper and lower  respiratory specimens during the acute phase of infection. The lowest  concentration of SARS-CoV-2 viral copies this assay can detect is 250  copies / mL. A negative result does not preclude SARS-CoV-2 infection  and should not be used as the sole  basis for treatment or other  patient management decisions.  A negative result may occur with  improper specimen collection / handling, submission of specimen other  than nasopharyngeal swab, presence of viral mutation(s) within the  areas targeted by this assay, and inadequate number of viral copies  (<250 copies / mL). A negative result must be combined with clinical  observations, patient history, and epidemiological information. If result is POSITIVE SARS-CoV-2 target nucleic acids are DETECTED. The SARS-CoV-2 RNA is generally detectable in upper and lower  respiratory specimens dur ing the acute phase of infection.  Positive  results are indicative of active infection with SARS-CoV-2.  Clinical  correlation with patient history and other diagnostic information is  necessary to determine patient infection status.  Positive results do  not rule out bacterial infection or co-infection with other viruses. If result is PRESUMPTIVE POSTIVE SARS-CoV-2 nucleic acids MAY BE PRESENT.   A presumptive positive result was obtained on the submitted specimen  and confirmed on repeat testing.  While 2019 novel coronavirus  (SARS-CoV-2) nucleic acids may be present in the submitted sample  additional confirmatory testing may be necessary for epidemiological  and / or clinical management purposes  to differentiate between  SARS-CoV-2 and other Sarbecovirus currently known to infect humans.  If clinically indicated additional testing with an alternate test  methodology [phone]) is advised. The SARS-CoV-2 RNA is generally  detectable in upper and lower respiratory sp ecimens during the acute  phase of infection. The expected result is Negative. Fact Sheet for Patients:  StrictlyIdeas.no Fact Sheet for Healthcare Providers: BankingDealers.co.za This test is not yet approved or cleared by the [place] FDA and has been authorized for detection  and/or diagnosis of SARS-CoV-2 by FDA under an Emergency Use Authorization (EUA).  This EUA will remain in effect (meaning this test can be used) for the duration of the COVID-19 declaration under Section 564(b)(1) of the Act, 21 U.S.C. section 360bbb-3(b)(1), unless the authorization is terminated or revoked sooner. Performed at [facility], [address] [place], [place] [address]   Blood culture (routine x 2)     Status: Abnormal   Collection Time: 07/01/18  1:19 AM   Specimen: BLOOD  Result Value Ref Range Status   Specimen Description   Final    BLOOD LEFT ANTECUBITAL Performed at [facility], [place] [address] [place], [place] [address]  Special Requests   Final    BOTTLES DRAWN AEROBIC AND ANAEROBIC Blood Culture results may not be optimal due to an excessive volume of blood received in culture bottles Performed at [facility], [address] [place], [place] [address]    Culture  Setup Time   Final    GRAM NEGATIVE RODS IN BOTH AEROBIC AND ANAEROBIC BOTTLES CRITICAL RESULT CALLED TO, READ BACK BY AND VERIFIED WITH: CHRISTINE KATSOUDAS AT 7096 07/01/18 SG Performed at [facility], [address] [place], [place] [address]    Culture ESCHERICHIA COLI (A)  Final   Report Status 07/03/2018 FINAL  Final   Organism ID, Bacteria ESCHERICHIA COLI  Final      Susceptibility   Escherichia coli - MIC*    AMPICILLIN 4 SENSITIVE Sensitive     CEFAZOLIN <=4 SENSITIVE Sensitive     CEFEPIME <=1 SENSITIVE Sensitive     CEFTAZIDIME <=1 SENSITIVE Sensitive     CEFTRIAXONE <=1 SENSITIVE Sensitive     CIPROFLOXACIN <=0.25 SENSITIVE Sensitive     GENTAMICIN <=1 SENSITIVE Sensitive     IMIPENEM <=0.25 SENSITIVE Sensitive     TRIMETH/SULFA <=20 SENSITIVE Sensitive     AMPICILLIN/SULBACTAM <=2 SENSITIVE Sensitive     PIP/TAZO <=4 SENSITIVE Sensitive     Extended ESBL NEGATIVE Sensitive     * ESCHERICHIA COLI  Blood culture (routine x 2)      Status: Abnormal   Collection Time: 07/01/18  3:09 AM   Specimen: BLOOD  Result Value Ref Range Status   Specimen Description   Final    BLOOD LEFT ARM Performed at [facility], [address], [place], [place] [address]    Special Requests   Final    BOTTLES DRAWN AEROBIC AND ANAEROBIC Blood Culture adequate volume Performed at [facility], [place], [place] [address]    Culture  Setup Time   Final    GRAM POSITIVE COCCI IN BOTH AEROBIC AND ANAEROBIC BOTTLES GRAM NEGATIVE RODS IN BOTH AEROBIC AND ANAEROBIC BOTTLES CRITICAL RESULT CALLED TO, READ BACK BY AND VERIFIED WITH: CHRISTINE KATSOUDAS AT 3546 07/01/18 SG Performed at [facility], [place] [address] [place], [place] [address]    Culture (A)  Final    ESCHERICHIA COLI ORGANISM 1 SUSCEPTIBILITIES PERFORMED ON PREVIOUS CULTURE WITHIN THE LAST 5 DAYS. STAPHYLOCOCCUS AUREUS VIRIDANS STREPTOCOCCUS THE SIGNIFICANCE OF ISOLATING THIS ORGANISM FROM A SINGLE SET OF BLOOD CULTURES WHEN MULTIPLE SETS ARE DRAWN IS UNCERTAIN. PLEASE NOTIFY THE MICROBIOLOGY DEPARTMENT WITHIN ONE WEEK IF SPECIATION AND SENSITIVITIES ARE REQUIRED. GROUP B STREP(S.AGALACTIAE)ISOLATED    Report Status 07/04/2018 FINAL  Final   Organism ID, Bacteria STAPHYLOCOCCUS AUREUS  Final   Organism ID, Bacteria GROUP B STREP(S.AGALACTIAE)ISOLATED  Final      Susceptibility   Group b strep(s.agalactiae)isolated - MIC*    CLINDAMYCIN >=1 RESISTANT Resistant     AMPICILLIN <=0.25 SENSITIVE Sensitive     ERYTHROMYCIN >=8 RESISTANT Resistant     VANCOMYCIN 0.5 SENSITIVE Sensitive     CEFTRIAXONE <=0.12 SENSITIVE Sensitive     LEVOFLOXACIN 1 SENSITIVE Sensitive     * GROUP B STREP(S.AGALACTIAE)ISOLATED   Staphylococcus aureus - MIC*    CIPROFLOXACIN 1 SENSITIVE Sensitive     ERYTHROMYCIN >=8 RESISTANT Resistant     GENTAMICIN <=0.5 SENSITIVE Sensitive     OXACILLIN 0.5 SENSITIVE Sensitive     TETRACYCLINE <=1 SENSITIVE Sensitive      VANCOMYCIN 1 SENSITIVE Sensitive  TRIMETH/SULFA <=10 SENSITIVE Sensitive     CLINDAMYCIN RESISTANT Resistant     RIFAMPIN <=0.5 SENSITIVE Sensitive     Inducible Clindamycin POSITIVE Resistant     * STAPHYLOCOCCUS AUREUS  Blood Culture ID Panel (Reflexed)     Status: Abnormal   Collection Time: 07/01/18  3:09 AM  Result Value Ref Range Status   Enterococcus species NOT DETECTED NOT DETECTED Final   Listeria monocytogenes NOT DETECTED NOT DETECTED Final   Staphylococcus species DETECTED (A) NOT DETECTED Final    Comment: CRITICAL RESULT CALLED TO, READ BACK BY AND VERIFIED WITH: CHRISTINE KATSOUDAS AT 5701 07/01/18 SG    Staphylococcus aureus (BCID) DETECTED (A) NOT DETECTED Final    Comment: Methicillin (oxacillin) susceptible Staphylococcus aureus (MSSA). Preferred therapy is anti staphylococcal beta lactam antibiotic (Cefazolin or Nafcillin), unless clinically contraindicated. CRITICAL RESULT CALLED TO, READ BACK BY AND VERIFIED WITH: CHRISTINE KATSOUDAS AT 7793 07/01/18 SG    Methicillin resistance NOT DETECTED NOT DETECTED Final   Streptococcus species DETECTED (A) NOT DETECTED Final    Comment: CRITICAL RESULT CALLED TO, READ BACK BY AND VERIFIED WITH: CHRISTINE KATSOUDAS AT 1325 07/01/18 SG    Streptococcus agalactiae DETECTED (A) NOT DETECTED Final    Comment: CRITICAL RESULT CALLED TO, READ BACK BY AND VERIFIED WITH: CHRISTINE KATSOUDAS AT 9030 07/01/18 SG    Streptococcus pneumoniae NOT DETECTED NOT DETECTED Final   Streptococcus pyogenes NOT DETECTED NOT DETECTED Final   Acinetobacter baumannii NOT DETECTED NOT DETECTED Final   Enterobacteriaceae species DETECTED (A) NOT DETECTED Final    Comment: Enterobacteriaceae represent a large family of gram-negative bacteria, not a single organism. CRITICAL RESULT CALLED TO, READ BACK BY AND VERIFIED WITH: CHRISTINE KATSOUDAS AT 0923 07/01/18 SG    Enterobacter cloacae complex NOT DETECTED NOT DETECTED Final   Escherichia coli  DETECTED (A) NOT DETECTED Final    Comment: CRITICAL RESULT CALLED TO, READ BACK BY AND VERIFIED WITH: CHRISTINE KATSOUDAS AT 3007 07/01/18 SG    Klebsiella oxytoca NOT DETECTED NOT DETECTED Final   Klebsiella pneumoniae NOT DETECTED NOT DETECTED Final   Proteus species NOT DETECTED NOT DETECTED Final   Serratia marcescens NOT DETECTED NOT DETECTED Final   Carbapenem resistance NOT DETECTED NOT DETECTED Final   Haemophilus influenzae NOT DETECTED NOT DETECTED Final   Neisseria meningitidis NOT DETECTED NOT DETECTED Final   Pseudomonas aeruginosa NOT DETECTED NOT DETECTED Final   Candida albicans NOT DETECTED NOT DETECTED Final   Candida glabrata NOT DETECTED NOT DETECTED Final   Candida krusei NOT DETECTED NOT DETECTED Final   Candida parapsilosis NOT DETECTED NOT DETECTED Final   Candida tropicalis NOT DETECTED NOT DETECTED Final    Comment: Performed at [facility], [address] [place], [place] [address]  MRSA PCR Screening     Status: None   Collection Time: 07/01/18  9:53 AM   Specimen: Nasopharyngeal  Result Value Ref Range Status   MRSA by PCR NEGATIVE NEGATIVE Final    Comment:        The GeneXpert MRSA Assay (FDA approved for NASAL specimens only), is one component of a comprehensive MRSA colonization surveillance program. It is not intended to diagnose MRSA infection nor to guide or monitor treatment for MRSA infections. Performed at [facility], [address] [place], [place] [address]   Culture, respiratory (non-expectorated)     Status: None   Collection Time: 07/02/18  6:48 PM   Specimen: Tracheal Aspirate; Respiratory  Result Value Ref Range Status  Specimen Description   Final    TRACHEAL ASPIRATE Performed at [facility], [address] [place], [place] [address]    Special Requests   Final    NONE Performed at [facility], [address] [place], [place] [address]    Gram Stain   Final    RARE WBC  PRESENT, PREDOMINANTLY PMN NO ORGANISMS SEEN    Culture   Final    RARE Consistent with normal respiratory flora. Performed at [facility], [place] [address], [place], [place] [address]    Report Status 07/05/2018 FINAL  Final  Culture, blood (routine x 2)     Status: None (Preliminary result)   Collection Time: 07/03/18 12:51 AM   Specimen: BLOOD  Result Value Ref Range Status   Specimen Description BLOOD RIGHT HAND  Final   Special Requests   Final    BOTTLES DRAWN AEROBIC AND ANAEROBIC Blood Culture adequate volume   Culture   Final    NO GROWTH 3 DAYS Performed at [facility], [address] [place], [place] [address]    Report Status PENDING  Incomplete  Culture, blood (routine x 2)     Status: None (Preliminary result)   Collection Time: 07/03/18 12:58 AM   Specimen: BLOOD  Result Value Ref Range Status   Specimen Description BLOOD LEFT HAND  Final   Special Requests   Final    BOTTLES DRAWN AEROBIC AND ANAEROBIC Blood Culture adequate volume   Culture   Final    NO GROWTH 3 DAYS Performed at [facility], [address] [place], [place] [address]    Report Status PENDING  Incomplete    Coagulation Studies: No results for input(s): LABPROT, INR in the last 72 hours.  Urinalysis: No results for input(s): COLORURINE, LABSPEC, PHURINE, GLUCOSEU, HGBUR, BILIRUBINUR, KETONESUR, PROTEINUR, UROBILINOGEN, NITRITE, LEUKOCYTESUR in the last 72 hours.  Invalid input(s): APPERANCEUR    Imaging: Ct Chest Wo Contrast  Result Date: 07/04/2018 CLINICAL DATA:  48 year old male with a past medical history of tuberculosis and HIV. The patient currently has severe respiratory distress and findings concerning for right lower lobe pneumonia. EXAM: CT CHEST WITHOUT CONTRAST TECHNIQUE: Multidetector CT imaging of the chest was performed following the standard protocol without IV contrast. COMPARISON:  CT chest dated 03/18/2018 FINDINGS: Cardiovascular: The heart  is enlarged. There are mild coronary artery calcifications. There is no significant pericardial effusion. Mediastinum/Nodes: There are mildly prominent mediastinal and hilar lymph nodes. There are some prominent but subcentimeter axillary and supraclavicular lymph nodes. The thyroid gland is unremarkable. The esophagus is unremarkable. Lungs/Pleura: Emphysematous changes are noted bilaterally. Endotracheal tube terminates above the carina. There is debris within the right mainstem bronchus. There is a large area of consolidation involving virtually all of the right lower lobe. There are additional scattered areas of consolidation involving all remaining lobes. There is no pneumothorax. There is no significant pleural effusion. The previously demonstrated cavitary area within the right upper lobe has improved. Upper Abdomen: No acute abnormality. Musculoskeletal: No chest wall mass or suspicious bone lesions identified. IMPRESSION: 1. There is consolidation involving nearly all of the right lower lobe. There are additional smaller areas of consolidation involving all remaining lobes. Findings are consistent with multifocal pneumonia. 2. Interval decrease in size of the previously demonstrated cavitary lesion in the right upper lobe. 3. No pneumothorax or significant pleural effusion. 4. Mild cardiomegaly. 5. Emphysema. Emphysema (ICD10-J43.9). Electronically Signed   By: Jamie Kato.D.  On: 07/04/2018 19:21   Dg Chest Port 1 View  Result Date: 07/06/2018 CLINICAL DATA:  Respiratory failure. Altered mental status. EXAM: PORTABLE CHEST 1 VIEW COMPARISON:  Chest radiograph 07/03/2018 and CT 07/04/2018 FINDINGS: Endotracheal tube terminates at the clavicular heads. Enteric tube has been removed since the prior radiographs were obtained. The cardiomediastinal silhouette is unchanged. There is persistent consolidation throughout the right lower lobe. They are is also patchy right upper lobe and retrocardiac  left lower lobe opacity as seen on CT. No pneumothorax is identified. IMPRESSION: Unchanged pneumonia with extensive right lower lobe involvement. Electronically Signed   By: Logan Bores M.D.   On: 07/06/2018 07:37     Medications:   . sodium chloride Stopped (07/04/18 1446)  .  ceFAZolin (ANCEF) IV 1 g (07/06/18 0517)  . dexmedetomidine (PRECEDEX) IV infusion Stopped (07/06/18 0930)  . fentaNYL infusion INTRAVENOUS Stopped (07/06/18 0930)  . norepinephrine (LEVOPHED) Adult infusion Stopped (07/06/18 0521)  . propofol (DIPRIVAN) infusion Stopped (07/06/18 0930)  . vasopressin (PITRESSIN) infusion - *FOR SHOCK* Stopped (07/04/18 0604)   . B-complex with vitamin C  1 tablet Per Tube QHS  . chlorhexidine gluconate (MEDLINE KIT)  15 mL Mouth Rinse BID  . Chlorhexidine Gluconate Cloth  6 each Topical Q0600  . docusate sodium  100 mg Oral BID  . feeding supplement (VITAL HIGH PROTEIN)  1,000 mL Per Tube Q24H  . heparin injection (subcutaneous)  5,000 Units Subcutaneous Q12H  . hydrocortisone sod succinate (SOLU-CORTEF) inj  50 mg Intravenous Q6H  . insulin aspart  0-15 Units Subcutaneous Q4H  . ipratropium-albuterol  3 mL Nebulization Q4H  . mouth rinse  15 mL Mouth Rinse 10 times per day  . pantoprazole (PROTONIX) IV  40 mg Intravenous Q24H  . senna  1 tablet Oral Daily  . sodium chloride flush  10-40 mL Intracatheter Q12H   sodium chloride, acetaminophen **OR** acetaminophen, alteplase, heparin, ondansetron **OR** ondansetron (ZOFRAN) IV, sodium chloride flush, traZODone  Assessment/ Plan:  48 y.o. male with a PMHx of HIV currently not on treatment, tuberculosis, history of abscess drainage, who was admitted to [facility] on 07/01/2018 for evaluation of altered mental status and unresponsiveness.  Patient admitted with severe right lower lobe pneumonia, acute renal failure, and respiratory failure.  1.  Acute renal failure secondary to acute tubular necrosis/proteinuria.    Originally  suspected ATN however patient did have proteinuria.  HIV-associated nephropathy is also a consideration.  Await further input from infectious disease.  Continue supportive care for now.  We will obtain renal ultrasound to determine kidney size.  Large kidney size would support HIV-associated nephropathy.  For now we will maintain the patient on dialysis treatments.  He did undergo dialysis yesterday therefore we will reassess him for dialysis tomorrow.  Still awaiting full serologic work-up  2.  Septic shock/hypotension.    Patient remains off of pressors.  3.  Metabolic acidosis.    Serum bicarbonate significantly improved and currently 26.  Continue to monitor acid-base status.  4.  Acute respiratory failure secondary to right lower lobe pneumonia.  Patient remains on the ventilator.  Weaning protocol as per pulmonary/critical care.   LOS: 5 Christalyn Goertz 6/14/202012:43 PM

## 2018-07-07 ENCOUNTER — Inpatient Hospital Stay: Payer: Medicaid Other

## 2018-07-07 DIAGNOSIS — Z95828 Presence of other vascular implants and grafts: Secondary | ICD-10-CM

## 2018-07-07 DIAGNOSIS — D72829 Elevated white blood cell count, unspecified: Secondary | ICD-10-CM

## 2018-07-07 LAB — CBC
HCT: 29 % — ABNORMAL LOW (ref 39.0–52.0)
Hemoglobin: 10 g/dL — ABNORMAL LOW (ref 13.0–17.0)
MCH: 33.3 pg (ref 26.0–34.0)
MCHC: 34.5 g/dL (ref 30.0–36.0)
MCV: 96.7 fL (ref 80.0–100.0)
Platelets: 104 10*3/uL — ABNORMAL LOW (ref 150–400)
RBC: 3 MIL/uL — ABNORMAL LOW (ref 4.22–5.81)
RDW: 15.9 % — ABNORMAL HIGH (ref 11.5–15.5)
WBC: 11.2 10*3/uL — ABNORMAL HIGH (ref 4.0–10.5)
nRBC: 0.3 % — ABNORMAL HIGH (ref 0.0–0.2)

## 2018-07-07 LAB — PHOSPHORUS: Phosphorus: 5 mg/dL — ABNORMAL HIGH (ref 2.5–4.6)

## 2018-07-07 LAB — PROTEIN ELECTROPHORESIS, SERUM
A/G Ratio: 0.4 — ABNORMAL LOW (ref 0.7–1.7)
Albumin ELP: 1.6 g/dL — ABNORMAL LOW (ref 2.9–4.4)
Alpha-1-Globulin: 0.6 g/dL — ABNORMAL HIGH (ref 0.0–0.4)
Alpha-2-Globulin: 0.9 g/dL (ref 0.4–1.0)
Beta Globulin: 0.5 g/dL — ABNORMAL LOW (ref 0.7–1.3)
Gamma Globulin: 2 g/dL — ABNORMAL HIGH (ref 0.4–1.8)
Globulin, Total: 4 g/dL — ABNORMAL HIGH (ref 2.2–3.9)
Total Protein ELP: 5.6 g/dL — ABNORMAL LOW (ref 6.0–8.5)

## 2018-07-07 LAB — COMPREHENSIVE METABOLIC PANEL
ALT: 10 U/L (ref 0–44)
AST: 56 U/L — ABNORMAL HIGH (ref 15–41)
Albumin: 1.6 g/dL — ABNORMAL LOW (ref 3.5–5.0)
Alkaline Phosphatase: 105 U/L (ref 38–126)
Anion gap: 8 (ref 5–15)
BUN: 68 mg/dL — ABNORMAL HIGH (ref 6–20)
CO2: 26 mmol/L (ref 22–32)
Calcium: 7.6 mg/dL — ABNORMAL LOW (ref 8.9–10.3)
Chloride: 102 mmol/L (ref 98–111)
Creatinine, Ser: 3.02 mg/dL — ABNORMAL HIGH (ref 0.61–1.24)
GFR calc Af Amer: 27 mL/min — ABNORMAL LOW (ref >60)
GFR calc non Af Amer: 23 mL/min — ABNORMAL LOW (ref >60)
Glucose, Bld: 150 mg/dL — ABNORMAL HIGH (ref 70–99)
Potassium: 5 mmol/L (ref 3.5–5.1)
Sodium: 136 mmol/L (ref 135–145)
Total Bilirubin: 1.9 mg/dL — ABNORMAL HIGH (ref 0.3–1.2)
Total Protein: 5.4 g/dL — ABNORMAL LOW (ref 6.5–8.1)

## 2018-07-07 LAB — GLUCOSE, CAPILLARY
Glucose-Capillary: 127 mg/dL — ABNORMAL HIGH (ref 70–99)
Glucose-Capillary: 131 mg/dL — ABNORMAL HIGH (ref 70–99)
Glucose-Capillary: 138 mg/dL — ABNORMAL HIGH (ref 70–99)
Glucose-Capillary: 138 mg/dL — ABNORMAL HIGH (ref 70–99)
Glucose-Capillary: 144 mg/dL — ABNORMAL HIGH (ref 70–99)
Glucose-Capillary: 148 mg/dL — ABNORMAL HIGH (ref 70–99)

## 2018-07-07 LAB — PROTEIN ELECTRO, RANDOM URINE
Albumin ELP, Urine: 32.6 %
Alpha-1-Globulin, U: 2.6 %
Alpha-2-Globulin, U: 10.4 %
Beta Globulin, U: 11.8 %
Gamma Globulin, U: 42.6 %
M Component, Ur: 8.8 % — ABNORMAL HIGH
Total Protein, Urine: 535.7 mg/dL

## 2018-07-07 LAB — MAGNESIUM: Magnesium: 2.2 mg/dL (ref 1.7–2.4)

## 2018-07-07 LAB — HEPATITIS PANEL, ACUTE
HCV Ab: 0.2 s/co ratio (ref 0.0–0.9)
Hep A IgM: NEGATIVE
Hep B C IgM: NEGATIVE
Hepatitis B Surface Ag: NEGATIVE

## 2018-07-07 LAB — LACTIC ACID, PLASMA: Lactic Acid, Venous: 1.1 mmol/L (ref 0.5–1.9)

## 2018-07-07 LAB — PROCALCITONIN: Procalcitonin: 21.32 ng/mL

## 2018-07-07 LAB — FUNGITELL, SERUM: Fungitell Result: 125 pg/mL — ABNORMAL HIGH (ref <80)

## 2018-07-07 MED ORDER — VITAL AF 1.2 CAL PO LIQD
1000.0000 mL | ORAL | Status: DC
  Administered 2018-07-07: 1000 mL

## 2018-07-07 MED ORDER — PANTOPRAZOLE SODIUM 40 MG PO PACK
40.0000 mg | PACK | Freq: Every day | ORAL | Status: DC
  Administered 2018-07-07: 40 mg
  Filled 2018-07-07: qty 20

## 2018-07-07 MED ORDER — CEFAZOLIN SODIUM-DEXTROSE 1-4 GM/50ML-% IV SOLN
1.0000 g | INTRAVENOUS | Status: DC
  Administered 2018-07-07: 18:00:00 1 g via INTRAVENOUS
  Filled 2018-07-07 (×2): qty 50

## 2018-07-07 NOTE — Progress Notes (Signed)
Follow up - Critical Care Medicine Note  Patient Details:    Joshua Dougherty is an 48 y.o. male with a PMH of Tuberculosis(dx 02/2018 underwent bronchoscopy for sampling on 03/28/2018 by Dr. Mattie Marlin HIV (currently not on treatment). He presented to [facility] ER on 06/9 via EMS withsevere respiratory distress.  Intubated and mechanically ventilated.  Noted to have severe sepsis and septic shock, with severe consolidation of the right lower lobe and polymicrobial bacteremia (staph aureus, E. Coli, GBS and Enterobacter).  Admitted to ICU for management.   Lines, Airways, Drains: Airway 7.5 mm (Active)  Secured at (cm) 23 cm 07/02/2018  8:55 PM  Measured From Lips 07/02/2018  8:55 PM  Secured Location Left 07/02/2018  8:55 PM  Secured By [employer] 07/02/2018  8:55 PM  Tube Holder Repositioned Yes 07/02/2018  8:55 PM  Cuff Pressure (cm H2O) 24 cm H2O 07/02/2018  8:55 PM  Site Condition Cool;Dry 07/02/2018  8:55 PM     CVC Triple Lumen 07/01/18 Right Femoral (Active)  Indication for Insertion or Continuance of Line Vasoactive infusions;Poor Vasculature-patient has had multiple peripheral attempts or PIVs lasting less than 24 hours 07/02/2018  8:00 PM  Site Assessment Clean;Dry;Intact 07/02/2018  8:00 PM  Proximal Lumen Status Infusing;Flushed;Blood return noted 07/02/2018  8:00 PM  Medial Lumen Status Infusing;Flushed;Blood return noted 07/02/2018  8:00 PM  Distal Lumen Status Flushed;Blood return noted;In-line blood sampling system in place 07/02/2018  8:00 PM  Dressing Type Transparent;Occlusive;Securing device 07/02/2018  8:00 PM  Dressing Status Intact;Dry;Antimicrobial disc in place;Clean 07/02/2018  8:00 PM  [place] pulled back;Connections checked and tightened 07/02/2018  8:00 PM  Dressing Change Due 07/07/18 07/02/2018  8:00 AM     NG/OG Tube Orogastric 16 Fr. Right mouth Xray (Active)  Cm Marking at Nare/Corner of Mouth (if applicable) 72 cm 8/56/3149  8:00 PM  Site  Assessment Clean;Dry;Intact 07/02/2018  8:00 PM  Ongoing Placement Verification No change in cm markings or external length of tube from initial placement;No change in respiratory status;No acute changes, not attributed to clinical condition 07/02/2018  8:00 PM  Status Suction-low intermittent 07/02/2018  8:00 PM  Amount of suction 80 mmHg 07/02/2018  8:00 PM  Drainage Appearance Green 07/02/2018  8:00 PM  Intake (mL) 0 mL 07/02/2018  8:00 AM  Output (mL) 50 mL 07/02/2018  8:00 PM     Urethral Catheter Megan RN  Non-latex 14 Fr. (Active)  Indication for Insertion or Continuance of Catheter Unstable critically ill patients first 24-48 hours (See Criteria) 07/02/2018  8:00 PM  Site Assessment Clean;Intact 07/02/2018  8:00 PM  Catheter Maintenance Bag below level of bladder;Catheter secured;Drainage bag/tubing not touching floor;Insertion date on drainage bag;Seal intact;No dependent loops 07/02/2018  8:00 PM  Collection Container Standard drainage bag 07/02/2018  8:00 PM  Securement Method Securing device (Describe) 07/02/2018  8:00 PM  Urinary Catheter Interventions Unclamped 07/02/2018  8:00 PM  Input (mL) 0 mL 07/02/2018  8:00 AM  Output (mL) 0 mL 07/02/2018  6:00 PM    Anti-infectives:  Anti-infectives (From admission, onward)   Start     Dose/Rate Route Frequency Ordered Stop   07/07/18 1800  ceFAZolin (ANCEF) IVPB 1 g/50 mL premix     1 g 100 mL/hr over 30 Minutes Intravenous Every 24 hours 07/07/18 1603     07/03/18 1600  ceFAZolin (ANCEF) IVPB 1 g/50 mL premix  Status:  Discontinued     1 g 100 mL/hr over 30 Minutes Intravenous Every 8 hours 07/03/18  1458 07/07/18 1603   07/02/18 2200  meropenem (MERREM) 1 g in sodium chloride 0.9 % 100 mL IVPB  Status:  Discontinued     1 g 200 mL/hr over 30 Minutes Intravenous Every 8 hours 07/02/18 1546 07/03/18 1458   07/01/18 2200  ceFEPIme (MAXIPIME) 2 g in sodium chloride 0.9 % 100 mL IVPB  Status:  Discontinued     2 g 200 mL/hr over 30 Minutes  Intravenous Every 24 hours 07/01/18 0727 07/01/18 1432   07/01/18 1600  meropenem (MERREM) 500 mg in sodium chloride 0.9 % 100 mL IVPB  Status:  Discontinued     500 mg 200 mL/hr over 30 Minutes Intravenous Every 12 hours 07/01/18 1432 07/02/18 1546   07/01/18 1000  azithromycin (ZITHROMAX) 500 mg in sodium chloride 0.9 % 250 mL IVPB  Status:  Discontinued     500 mg 250 mL/hr over 60 Minutes Intravenous Every 24 hours 07/01/18 0425 07/02/18 1151   07/01/18 1000  vancomycin (VANCOCIN) 500 mg in sodium chloride 0.9 % 100 mL IVPB     500 mg 100 mL/hr over 60 Minutes Intravenous  Once 07/01/18 0728 07/02/18 0812   07/01/18 0600  ceFEPIme (MAXIPIME) 1 g in sodium chloride 0.9 % 100 mL IVPB  Status:  Discontinued     1 g 200 mL/hr over 30 Minutes Intravenous Every 8 hours 07/01/18 0425 07/01/18 0727   07/01/18 0200  clindamycin (CLEOCIN) IVPB 600 mg     600 mg 100 mL/hr over 30 Minutes Intravenous  Once 07/01/18 0145 07/01/18 0443   07/01/18 0145  vancomycin (VANCOCIN) IVPB 1000 mg/200 mL premix     1,000 mg 200 mL/hr over 60 Minutes Intravenous  Once 07/01/18 0131 07/01/18 0443   07/01/18 0145  ceFEPIme (MAXIPIME) 1 g in sodium chloride 0.9 % 100 mL IVPB     1 g 200 mL/hr over 30 Minutes Intravenous  Once 07/01/18 0131 07/01/18 0401      Microbiology: Results for orders placed or performed during the hospital encounter of 07/01/18  SARS Coronavirus 2 (CEPHEID- Performed in [place] hospital lab), Hosp Order     Status: None   Collection Time: 07/01/18  1:19 AM   Specimen: Nasopharyngeal Swab  Result Value Ref Range Status   SARS Coronavirus 2 NEGATIVE NEGATIVE Final    Comment: (NOTE) If result is NEGATIVE SARS-CoV-2 target nucleic acids are NOT DETECTED. The SARS-CoV-2 RNA is generally detectable in upper and lower  respiratory specimens during the acute phase of infection. The lowest  concentration of SARS-CoV-2 viral copies this assay can detect is 250  copies / mL. A negative  result does not preclude SARS-CoV-2 infection  and should not be used as the sole basis for treatment or other  patient management decisions.  A negative result may occur with  improper specimen collection / handling, submission of specimen other  than nasopharyngeal swab, presence of viral mutation(s) within the  areas targeted by this assay, and inadequate number of viral copies  (<250 copies / mL). A negative result must be combined with clinical  observations, patient history, and epidemiological information. If result is POSITIVE SARS-CoV-2 target nucleic acids are DETECTED. The SARS-CoV-2 RNA is generally detectable in upper and lower  respiratory specimens dur ing the acute phase of infection.  Positive  results are indicative of active infection with SARS-CoV-2.  Clinical  correlation with patient history and other diagnostic information is  necessary to determine patient infection status.  Positive results do  not rule out bacterial infection or co-infection with other viruses. If result is PRESUMPTIVE POSTIVE SARS-CoV-2 nucleic acids MAY BE PRESENT.   A presumptive positive result was obtained on the submitted specimen  and confirmed on repeat testing.  While 2019 novel coronavirus  (SARS-CoV-2) nucleic acids may be present in the submitted sample  additional confirmatory testing may be necessary for epidemiological  and / or clinical management purposes  to differentiate between  SARS-CoV-2 and other Sarbecovirus currently known to infect humans.  If clinically indicated additional testing with an alternate test  methodology [phone]) is advised. The SARS-CoV-2 RNA is generally  detectable in upper and lower respiratory sp ecimens during the acute  phase of infection. The expected result is Negative. Fact Sheet for Patients:  StrictlyIdeas.no Fact Sheet for Healthcare Providers: BankingDealers.co.za This test is not yet  approved or cleared by the [place] FDA and has been authorized for detection and/or diagnosis of SARS-CoV-2 by FDA under an Emergency Use Authorization (EUA).  This EUA will remain in effect (meaning this test can be used) for the duration of the COVID-19 declaration under Section 564(b)(1) of the Act, 21 U.S.C. section 360bbb-3(b)(1), unless the authorization is terminated or revoked sooner. Performed at [facility], [address] [place], [place] [address]   Blood culture (routine x 2)     Status: Abnormal   Collection Time: 07/01/18  1:19 AM   Specimen: BLOOD  Result Value Ref Range Status   Specimen Description   Final    BLOOD LEFT ANTECUBITAL Performed at [facility], [place] [address] [place], [place] [address]    Special Requests   Final    BOTTLES DRAWN AEROBIC AND ANAEROBIC Blood Culture results may not be optimal due to an excessive volume of blood received in culture bottles Performed at [facility], [address] [place], [place] [address]    Culture  Setup Time   Final    GRAM NEGATIVE RODS IN BOTH AEROBIC AND ANAEROBIC BOTTLES CRITICAL RESULT CALLED TO, READ BACK BY AND VERIFIED WITH: CHRISTINE KATSOUDAS AT 3474 07/01/18 SG Performed at [facility], [address] [place], [place] [address]    Culture ESCHERICHIA COLI (A)  Final   Report Status 07/03/2018 FINAL  Final   Organism ID, Bacteria ESCHERICHIA COLI  Final      Susceptibility   Escherichia coli - MIC*    AMPICILLIN 4 SENSITIVE Sensitive     CEFAZOLIN <=4 SENSITIVE Sensitive     CEFEPIME <=1 SENSITIVE Sensitive     CEFTAZIDIME <=1 SENSITIVE Sensitive     CEFTRIAXONE <=1 SENSITIVE Sensitive     CIPROFLOXACIN <=0.25 SENSITIVE Sensitive     GENTAMICIN <=1 SENSITIVE Sensitive     IMIPENEM <=0.25 SENSITIVE Sensitive     TRIMETH/SULFA <=20 SENSITIVE Sensitive     AMPICILLIN/SULBACTAM <=2 SENSITIVE Sensitive     PIP/TAZO <=4 SENSITIVE Sensitive     Extended  ESBL NEGATIVE Sensitive     * ESCHERICHIA COLI  Blood culture (routine x 2)     Status: Abnormal   Collection Time: 07/01/18  3:09 AM   Specimen: BLOOD  Result Value Ref Range Status   Specimen Description   Final    BLOOD LEFT ARM Performed at [facility], [address], [place], [place] [address]    Special Requests   Final    BOTTLES DRAWN AEROBIC AND ANAEROBIC Blood Culture adequate volume Performed at [facility], [place], [place]  [address]    Culture  Setup Time   Final    GRAM POSITIVE COCCI IN BOTH AEROBIC AND ANAEROBIC BOTTLES GRAM NEGATIVE RODS IN BOTH AEROBIC AND ANAEROBIC BOTTLES CRITICAL RESULT CALLED TO, READ BACK BY AND VERIFIED WITH: CHRISTINE KATSOUDAS AT 5701 07/01/18 SG Performed at [facility], [place] [address], [place], [place] [address]    Culture (A)  Final    ESCHERICHIA COLI ORGANISM 1 SUSCEPTIBILITIES PERFORMED ON PREVIOUS CULTURE WITHIN THE LAST 5 DAYS. STAPHYLOCOCCUS AUREUS VIRIDANS STREPTOCOCCUS THE SIGNIFICANCE OF ISOLATING THIS ORGANISM FROM A SINGLE SET OF BLOOD CULTURES WHEN MULTIPLE SETS ARE DRAWN IS UNCERTAIN. PLEASE NOTIFY THE MICROBIOLOGY DEPARTMENT WITHIN ONE WEEK IF SPECIATION AND SENSITIVITIES ARE REQUIRED. GROUP B STREP(S.AGALACTIAE)ISOLATED    Report Status 07/04/2018 FINAL  Final   Organism ID, Bacteria STAPHYLOCOCCUS AUREUS  Final   Organism ID, Bacteria GROUP B STREP(S.AGALACTIAE)ISOLATED  Final      Susceptibility   Group b strep(s.agalactiae)isolated - MIC*    CLINDAMYCIN >=1 RESISTANT Resistant     AMPICILLIN <=0.25 SENSITIVE Sensitive     ERYTHROMYCIN >=8 RESISTANT Resistant     VANCOMYCIN 0.5 SENSITIVE Sensitive     CEFTRIAXONE <=0.12 SENSITIVE Sensitive     LEVOFLOXACIN 1 SENSITIVE Sensitive     * GROUP B STREP(S.AGALACTIAE)ISOLATED   Staphylococcus aureus - MIC*    CIPROFLOXACIN 1 SENSITIVE Sensitive     ERYTHROMYCIN >=8 RESISTANT Resistant     GENTAMICIN <=0.5 SENSITIVE Sensitive      OXACILLIN 0.5 SENSITIVE Sensitive     TETRACYCLINE <=1 SENSITIVE Sensitive     VANCOMYCIN 1 SENSITIVE Sensitive     TRIMETH/SULFA <=10 SENSITIVE Sensitive     CLINDAMYCIN RESISTANT Resistant     RIFAMPIN <=0.5 SENSITIVE Sensitive     Inducible Clindamycin POSITIVE Resistant     * STAPHYLOCOCCUS AUREUS  Blood Culture ID Panel (Reflexed)     Status: Abnormal   Collection Time: 07/01/18  3:09 AM  Result Value Ref Range Status   Enterococcus species NOT DETECTED NOT DETECTED Final   Listeria monocytogenes NOT DETECTED NOT DETECTED Final   Staphylococcus species DETECTED (A) NOT DETECTED Final    Comment: CRITICAL RESULT CALLED TO, READ BACK BY AND VERIFIED WITH: CHRISTINE KATSOUDAS AT 1325 07/01/18 SG    Staphylococcus aureus (BCID) DETECTED (A) NOT DETECTED Final    Comment: Methicillin (oxacillin) susceptible Staphylococcus aureus (MSSA). Preferred therapy is anti staphylococcal beta lactam antibiotic (Cefazolin or Nafcillin), unless clinically contraindicated. CRITICAL RESULT CALLED TO, READ BACK BY AND VERIFIED WITH: CHRISTINE KATSOUDAS AT 0300 07/01/18 SG    Methicillin resistance NOT DETECTED NOT DETECTED Final   Streptococcus species DETECTED (A) NOT DETECTED Final    Comment: CRITICAL RESULT CALLED TO, READ BACK BY AND VERIFIED WITH: CHRISTINE KATSOUDAS AT 1325 07/01/18 SG    Streptococcus agalactiae DETECTED (A) NOT DETECTED Final    Comment: CRITICAL RESULT CALLED TO, READ BACK BY AND VERIFIED WITH: CHRISTINE KATSOUDAS AT 9233 07/01/18 SG    Streptococcus pneumoniae NOT DETECTED NOT DETECTED Final   Streptococcus pyogenes NOT DETECTED NOT DETECTED Final   Acinetobacter baumannii NOT DETECTED NOT DETECTED Final   Enterobacteriaceae species DETECTED (A) NOT DETECTED Final    Comment: Enterobacteriaceae represent a large family of gram-negative bacteria, not a single organism. CRITICAL RESULT CALLED TO, READ BACK BY AND VERIFIED WITH: CHRISTINE KATSOUDAS AT 0076 07/01/18 SG     Enterobacter cloacae complex NOT DETECTED NOT DETECTED Final   Escherichia coli DETECTED (A) NOT DETECTED Final  Comment: CRITICAL RESULT CALLED TO, READ BACK BY AND VERIFIED WITH: CHRISTINE KATSOUDAS AT 2637 07/01/18 SG    Klebsiella oxytoca NOT DETECTED NOT DETECTED Final   Klebsiella pneumoniae NOT DETECTED NOT DETECTED Final   Proteus species NOT DETECTED NOT DETECTED Final   Serratia marcescens NOT DETECTED NOT DETECTED Final   Carbapenem resistance NOT DETECTED NOT DETECTED Final   Haemophilus influenzae NOT DETECTED NOT DETECTED Final   Neisseria meningitidis NOT DETECTED NOT DETECTED Final   Pseudomonas aeruginosa NOT DETECTED NOT DETECTED Final   Candida albicans NOT DETECTED NOT DETECTED Final   Candida glabrata NOT DETECTED NOT DETECTED Final   Candida krusei NOT DETECTED NOT DETECTED Final   Candida parapsilosis NOT DETECTED NOT DETECTED Final   Candida tropicalis NOT DETECTED NOT DETECTED Final    Comment: Performed at [facility], [address] [place], [place] [address]  MRSA PCR Screening     Status: None   Collection Time: 07/01/18  9:53 AM   Specimen: Nasopharyngeal  Result Value Ref Range Status   MRSA by PCR NEGATIVE NEGATIVE Final    Comment:        The GeneXpert MRSA Assay (FDA approved for NASAL specimens only), is one component of a comprehensive MRSA colonization surveillance program. It is not intended to diagnose MRSA infection nor to guide or monitor treatment for MRSA infections. Performed at [facility], [address] [place], [place] [address]   Culture, respiratory (non-expectorated)     Status: None   Collection Time: 07/02/18  6:48 PM   Specimen: Tracheal Aspirate; Respiratory  Result Value Ref Range Status   Specimen Description   Final    TRACHEAL ASPIRATE Performed at [facility], [address] [place], [place] [address]    Special Requests   Final    NONE Performed at [facility], [address] [place], [place] [address]    Gram Stain   Final    RARE WBC PRESENT, PREDOMINANTLY PMN NO ORGANISMS SEEN    Culture   Final    RARE Consistent with normal respiratory flora. Performed at [facility], [place] [address] [place], [place] [address]    Report Status 07/05/2018 FINAL  Final  Culture, blood (routine x 2)     Status: None (Preliminary result)   Collection Time: 07/03/18 12:51 AM   Specimen: BLOOD  Result Value Ref Range Status   Specimen Description BLOOD RIGHT HAND  Final   Special Requests   Final    BOTTLES DRAWN AEROBIC AND ANAEROBIC Blood Culture adequate volume   Culture   Final    NO GROWTH 3 DAYS Performed at [facility], [address] [place], [place] [address]    Report Status PENDING  Incomplete  Culture, blood (routine x 2)     Status: None (Preliminary result)   Collection Time: 07/03/18 12:58 AM   Specimen: BLOOD  Result Value Ref Range Status   Specimen Description BLOOD LEFT HAND  Final   Special Requests   Final    BOTTLES DRAWN AEROBIC AND ANAEROBIC Blood Culture adequate volume   Culture   Final    NO GROWTH 3 DAYS Performed at [facility], [address], [place],  [address]    Report Status PENDING  Incomplete    Best Practice/Protocols:  VTE Prophylaxis: Mechanical GI Prophylaxis: Proton Pump Inhibitor Continous Sedation  Events: 6/9 admitted for severe resp failure/SHock, E. 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 EXAM: ABDOMEN  - 1 VIEW COMPARISON:  07/01/2018 abdomen radiograph. 07/06/2018 chest radiograph. FINDINGS: Normal bowel gas pattern. Bones are unremarkable. Nasogastric tube projects over the gastric body with proximal side hole at the gastroesophageal junction. Right lung base opacity again noted. IMPRESSION: Nasogastric tube projects over the gastric body with proximal side hole at the gastroesophageal junction. Electronically Signed   By: Kristine Garbe M.D.   On: 07/06/2018 22:31   Ct Head Wo Contrast  Result Date: 07/07/2018 CLINICAL DATA:  Altered mental status. Respiratory distress. History of HIV and tuberculosis. EXAM: CT HEAD WITHOUT CONTRAST TECHNIQUE: Contiguous axial images were obtained from the base of the skull through the vertex without intravenous contrast. COMPARISON:  None. FINDINGS: Brain: No evidence for acute infarction, hemorrhage, mass lesion, hydrocephalus, or extra-axial fluid. Mild atrophy, premature for age. Hypoattenuation of white matter, likely small vessel disease. Old RIGHT frontal infarct. Vascular: No hyperdense vessel or unexpected calcification. Skull: Normal. Negative for fracture or focal lesion. Sinuses/Orbits: No acute finding. Other: None. IMPRESSION: Chronic changes as described. No acute intracranial findings. Electronically Signed   By: Staci Righter M.D.   On: 07/07/2018 12:10   Ct Chest Wo Contrast  Result Date: 07/04/2018 CLINICAL DATA:  48 year old male with a past medical history of tuberculosis and HIV. The patient currently has severe respiratory distress and findings concerning for right lower lobe pneumonia. EXAM: CT CHEST WITHOUT CONTRAST TECHNIQUE: Multidetector CT imaging of the chest was performed following the standard protocol without IV contrast. COMPARISON:  CT chest dated 03/18/2018 FINDINGS: Cardiovascular: The heart is enlarged. There are mild coronary artery calcifications. There is no significant pericardial effusion. Mediastinum/Nodes: There are  mildly prominent mediastinal and hilar lymph nodes. There are some prominent but subcentimeter axillary and supraclavicular lymph nodes. The thyroid gland is unremarkable. The esophagus is unremarkable. Lungs/Pleura: Emphysematous changes are noted bilaterally. Endotracheal tube terminates above the carina. There is debris within the right mainstem bronchus. There is a large area of consolidation involving virtually all of the right lower lobe. There are additional scattered areas of consolidation involving all remaining lobes. There is no pneumothorax. There is no significant pleural effusion. The previously demonstrated cavitary area within the right upper lobe has improved. Upper Abdomen: No acute abnormality. Musculoskeletal: No chest wall mass or suspicious bone lesions identified. IMPRESSION: 1. There is consolidation involving nearly all of the right lower lobe. There are additional smaller areas of consolidation involving all remaining lobes. Findings are consistent with multifocal pneumonia. 2. Interval decrease in size of the previously demonstrated cavitary lesion in the right upper lobe. 3. No pneumothorax or significant pleural effusion. 4. Mild cardiomegaly. 5. Emphysema. Emphysema (ICD10-J43.9). Electronically Signed   By: Constance Holster M.D.   On: 07/04/2018 19:21   US Renal  Result Date: 07/06/2018 CLINICAL DATA:  Acute renal failure EXAM: RENAL / URINARY TRACT ULTRASOUND COMPLETE COMPARISON:  None. FINDINGS: Right Kidney: Renal measurements: 11.2 x 5.6 x 4.7 cm = volume: 154 mL. Increased echogenicity compatible with medical renal disease. No hydronephrosis or focal abnormality. Left Kidney: Renal measurements: 11.2  x 5.2 x 6.1 cm = volume: 187 mL. Similar increased renal echogenicity compatible with medical renal disease. No focal abnormality or hydronephrosis. Bladder: Appears normal for degree of bladder distention. Small amount of diffuse abdominopelvic ascites noted. IMPRESSION:  Increased renal echogenicity bilaterally compatible with ongoing medical renal disease. No hydronephrosis. Small amount of abdominopelvic ascites Electronically Signed   By: Jerilynn Mages.  Shick M.D.   On: 07/06/2018 14:17   Dg Chest Port 1 View  Result Date: 07/07/2018 CLINICAL DATA:  Respiratory distress. EXAM: PORTABLE CHEST 1 VIEW COMPARISON:  07/06/2018. FINDINGS: Endotracheal tube stable position. NG tube noted with tip below left hemidiaphragm. Heart size stable. Right mid lung and right lower lung infiltrates again noted with slight improvement in aeration. Continued follow-up exams suggested to demonstrate clearing and to exclude underlying mass. Small right pleural effusion again noted. No pneumothorax. Thoracic spine scoliosis. IMPRESSION: 1. Endotracheal tube stable position. NG tube noted with tip below left hemidiaphragm. 2. Right mid lung and right lower lung infiltrates again noted with slight improvement in aeration. Continued follow-up exam suggested to demonstrate clearing and to exclude underlying mass. Electronically Signed   By: Marcello Moores  Register   On: 07/07/2018 06:16   Dg Chest Port 1 View  Result Date: 07/06/2018 CLINICAL DATA:  Respiratory failure. Altered mental status. EXAM: PORTABLE CHEST 1 VIEW COMPARISON:  Chest radiograph 07/03/2018 and CT 07/04/2018 FINDINGS: Endotracheal tube terminates at the clavicular heads. Enteric tube has been removed since the prior radiographs were obtained. The cardiomediastinal silhouette is unchanged. There is persistent consolidation throughout the right lower lobe. They are is also patchy right upper lobe and retrocardiac left lower lobe opacity as seen on CT. No pneumothorax is identified. IMPRESSION: Unchanged pneumonia with extensive right lower lobe involvement. Electronically Signed   By: Logan Bores M.D.   On: 07/06/2018 07:37   Dg Chest Port 1 View  Result Date: 07/03/2018 CLINICAL DATA:  Respiratory failure EXAM: PORTABLE CHEST 1 VIEW  COMPARISON:  Portable exam 0434 hours compared to 07/01/2018 FINDINGS: Nasogastric tube extends into stomach. Tip of endotracheal tube projects 6.1 cm above carina. External pacing leads in EKG leads project over chest. Normal heart size, mediastinal contours, and pulmonary vascularity. Persistent consolidation RIGHT lower lobe. Remaining lungs grossly clear. No definite pleural effusion or pneumothorax. IMPRESSION: Persistent consolidation RIGHT lower lobe. Electronically Signed   By: Lavonia Dana M.D.   On: 07/03/2018 08:07   Dg Chest Portable 1 View  Result Date: 07/01/2018 CLINICAL DATA:  Hypoxia EXAM: PORTABLE CHEST 1 VIEW COMPARISON:  July 01, 2018 study obtained earlier in the day FINDINGS: Note that a portion of the left base is not apparent on either this examination or the abdominal image obtained at this time. Endotracheal tube tip is 5.7 cm above the carina. Nasogastric tube tip and side port extend below the diaphragm. No pneumothorax. Visualized left lung is clear. There is extensive airspace consolidation throughout much of the right lung including essentially all of the right middle and lower lobes as well as a portion of the right upper lobe. There is a right pleural effusion, stable. Heart size and pulmonary vascularity are normal. No adenopathy. No bone lesions. IMPRESSION: Tube positions as described without pneumothorax. Extensive airspace consolidation on the right with right pleural effusion, stable. Visualized left lung clear. Stable cardiac silhouette. Electronically Signed   By: Lowella Grip III M.D.   On: 07/01/2018 08:35   Dg Chest Portable 1 View  Result Date: 07/01/2018 CLINICAL DATA:  Shortness of breath  EXAM: PORTABLE CHEST 1 VIEW COMPARISON:  04/16/2018 FINDINGS: There is a large airspace opacity involving much of the right lower lobe. There is likely a small parapneumonic effusion. There is a density projecting over the upper trachea which is felt to represent the patient's  non-rebreather mass as opposed to a very high endotracheal tube. Involving the left lower lobe, however this is not well evaluated secondary to multiple cardiac leads. There is no definite pneumothorax. There is a linear density coursing across the patient's right mid chest which is felt to represent artifact as opposed to a pneumothorax. There is no acute osseous abnormality. IMPRESSION: 1. Large right lower lobe airspace opacity concerning for pneumonia. 2. Probable small right-sided parapneumonic effusion. 3. Additional small airspace opacity at the left lung base. Attention on follow-up examinations is recommended. Electronically Signed   By: Constance Holster M.D.   On: 07/01/2018 01:51   Dg Abd Portable 1 View  Result Date: 07/01/2018 CLINICAL DATA:  Orogastric tube placement EXAM: PORTABLE ABDOMEN - 1 VIEW COMPARISON:  None. FINDINGS: Orogastric tube tip and side port are in the stomach. There is no bowel dilatation or air-fluid level to suggest bowel obstruction. No free air. There is a right pleural effusion with extensive consolidation throughout the visualized right lung. Visualized left lung clear. IMPRESSION: Orogastric tube tip and side port in stomach. No bowel obstruction or free air demonstrable. Extensive airspace consolidation with pleural effusion on the right. Electronically Signed   By: Lowella Grip III M.D.   On: 07/01/2018 08:36    Consults: Treatment Team:  Tsosie Billing, MD Anthonette Legato, MD   Subjective:    Overnight Issues: No overnight issues.  Sedated, mechanically ventilated.  Objective:  Vital signs for last 24 hours: Temp:  [97.1 F (36.2 C)-97.7 F (36.5 C)] 97.7 F (36.5 C) (06/15 1446) Pulse Rate:  [60-85] 73 (06/15 1500) Resp:  [9-19] 14 (06/15 1600) BP: (89-126)/(66-81) 126/79 (06/15 1600) SpO2:  [93 %-100 %] 99 % (06/15 1615) FiO2 (%):  [30 %-40 %] 30 % (06/15 1615) Weight:  [70.8 kg] 70.8 kg (06/15 0414)  Hemodynamic parameters for  last 24 hours:    Intake/Output from previous day: 06/14 0701 - 06/15 0700 In: 1506.6 [I.V.:1074.7; NG/GT:135.7; IV Piggyback:296.1] Out: -   Intake/Output this shift: No intake/output data recorded.  Vent settings for last 24 hours: Vent Mode: PRVC FiO2 (%):  [30 %-40 %] 30 % Set Rate:  [18 bmp-19 bmp] 18 bmp Vt Set:  [500 mL] 500 mL PEEP:  [5 cmH20] 5 cmH20 Pressure Support:  [10 cmH20] 10 cmH20 Plateau Pressure:  [13 cmH20-15 cmH20] 15 cmH20  Physical Exam:  General: Thin chronically ill-appearing male, unresponsive, intubated mechanically ventilated. Neuro: Unresponsive, that is being held.  Not assess further. HEENT: Bilateral temporal wasting, anicteric sclera.  Conjunctiva pale. Cardiovascular: Tachycardic, regular rate, no rubs murmurs gallops heard. Lungs: Diffuse rhonchi throughout.  Diminished breath sounds on the right base this. Abdomen: Scaphoid, soft, nondistended, normoactive bowel sounds.  Cannot assess tenderness as the patient is unresponsive. Musculoskeletal: Decreased muscle mass throughout.  No deformities noted. Skin: Warm, dry, rashes noted.  Assessment/Plan:  Acute hypoxic respiratory failure secondary to RLL pneumonia and metabolic acidosis  Continue ventilator support.  Every 4 hour bronchodilators for mucociliary clearance Trend WBC and monitor fever curve Trend PCT and lactic acid Follow cultures  Continue antibiotics  CT chest when more stable  Metabolic acidosis-lactic acidosis Trend BMP, ABG and lactic acid Replace electrolytes as indicated  Initiate bicarbonate  drip. 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   LOS: 6 days   Additional comments:Multidisciplinary rounds with ICU team were done  Critical Care Total Time*: 40 Minutes  C. Derrill Kay, MD [facility] 07/07/2018  *Care during the described time interval was provided by me and/or other providers on the critical care team.  I have reviewed this patient's available data, including medical history, events of note, physical examination and test results as part of my evaluation.

## 2018-07-07 NOTE — Progress Notes (Signed)
Date of Admission:  07/01/2018      LDA 07/01/18 -rt femoral triple lumen 07/01/18 ETT 6/10 HD catheter left femoral  Subjective: Remains intubated Anuric On dialysis   Medications:  . B-complex with vitamin C  1 tablet Per Tube QHS  . chlorhexidine gluconate (MEDLINE KIT)  15 mL Mouth Rinse BID  . Chlorhexidine Gluconate Cloth  6 each Topical Q0600  . docusate sodium  100 mg Oral BID  . heparin injection (subcutaneous)  5,000 Units Subcutaneous Q12H  . insulin aspart  0-15 Units Subcutaneous Q4H  . ipratropium-albuterol  3 mL Nebulization Q4H  . mouth rinse  15 mL Mouth Rinse 10 times per day  . pantoprazole (PROTONIX) IV  40 mg Intravenous Q24H  . senna  1 tablet Oral Daily  . sodium chloride flush  10-40 mL Intracatheter Q12H    Objective: Vital signs in last 24 hours: Temp:  [97.1 F (36.2 C)-97.9 F (36.6 C)] 97.5 F (36.4 C) (06/15 0732) Pulse Rate:  [60-85] 85 (06/15 1100) Resp:  [9-18] 11 (06/15 1100) BP: (89-113)/(66-78) 110/78 (06/15 1100) SpO2:  [93 %-100 %] 98 % (06/15 1111) FiO2 (%):  [30 %-40 %] 40 % (06/15 1111) Weight:  [70.8 kg] 70.8 kg (06/15 0414)  PHYSICAL EXAM:  General: sedated ENT ETT, OG Neck: Supple, Back: did not examine Lungs: b/l air entry- decreased on th right Heart: irregular Abdomen: Soft, non-tender,not distended. Bowel sounds normal. No masses Extremities: b/l femoral line Skin: No rashes or lesions. Or bruising Lymph: Cervical, supraclavicular normal. Neurologic: cannot be examined Lab Results Recent Labs    07/06/18 0404 07/07/18 0403  WBC 18.9* 11.2*  HGB 10.7* 10.0*  HCT 30.4* 29.0*  NA 135 136  K 4.5 5.0  CL 100 102  CO2 26 26  BUN 44* 68*  CREATININE 1.97* 3.02*   Liver Panel Recent Labs    07/06/18 0404 07/07/18 0403  PROT 5.9* 5.4*  ALBUMIN 1.8* 1.6*  AST 92* 56*  ALT 21 10  ALKPHOS 116 105  BILITOT 3.0* 1.9*   Sedimentation Rate No results for input(s): ESRSEDRATE in the last 72  hours. C-Reactive Protein No results for input(s): CRP in the last 72 hours.  Microbiology: 6/9 BC -MSSA, GBS, e.coli 6/10 sputum culture NG 6/11- BC NG Studies/Results: Dg Abd 1 View  Result Date: 07/06/2018 CLINICAL DATA:  48 y/o  M; NG tube placement. EXAM: ABDOMEN - 1 VIEW COMPARISON:  07/01/2018 abdomen radiograph. 07/06/2018 chest radiograph. FINDINGS: Normal bowel gas pattern. Bones are unremarkable. Nasogastric tube projects over the gastric body with proximal side hole at the gastroesophageal junction. Right lung base opacity again noted. IMPRESSION: Nasogastric tube projects over the gastric body with proximal side hole at the gastroesophageal junction. Electronically Signed   By: Kristine Garbe M.D.   On: 07/06/2018 22:31   Ct Head Wo Contrast  Result Date: 07/07/2018 CLINICAL DATA:  Altered mental status. Respiratory distress. History of HIV and tuberculosis. EXAM: CT HEAD WITHOUT CONTRAST TECHNIQUE: Contiguous axial images were obtained from the base of the skull through the vertex without intravenous contrast. COMPARISON:  None. FINDINGS: Brain: No evidence for acute infarction, hemorrhage, mass lesion, hydrocephalus, or extra-axial fluid. Mild atrophy, premature for age. Hypoattenuation of white matter, likely small vessel disease. Old RIGHT frontal infarct. Vascular: No hyperdense vessel or unexpected calcification. Skull: Normal. Negative for fracture or focal lesion. Sinuses/Orbits: No acute finding. Other: None. IMPRESSION: Chronic changes as described. No acute intracranial findings. Electronically Signed   By: Jenny Reichmann  Alfonse Flavors M.D.   On: 07/07/2018 12:10   US Renal  Result Date: 07/06/2018 CLINICAL DATA:  Acute renal failure EXAM: RENAL / URINARY TRACT ULTRASOUND COMPLETE COMPARISON:  None. FINDINGS: Right Kidney: Renal measurements: 11.2 x 5.6 x 4.7 cm = volume: 154 mL. Increased echogenicity compatible with medical renal disease. No hydronephrosis or focal  abnormality. Left Kidney: Renal measurements: 11.2 x 5.2 x 6.1 cm = volume: 187 mL. Similar increased renal echogenicity compatible with medical renal disease. No focal abnormality or hydronephrosis. Bladder: Appears normal for degree of bladder distention. Small amount of diffuse abdominopelvic ascites noted. IMPRESSION: Increased renal echogenicity bilaterally compatible with ongoing medical renal disease. No hydronephrosis. Small amount of abdominopelvic ascites Electronically Signed   By: Jerilynn Mages.  Shick M.D.   On: 07/06/2018 14:17   Dg Chest Port 1 View  Result Date: 07/07/2018 CLINICAL DATA:  Respiratory distress. EXAM: PORTABLE CHEST 1 VIEW COMPARISON:  07/06/2018. FINDINGS: Endotracheal tube stable position. NG tube noted with tip below left hemidiaphragm. Heart size stable. Right mid lung and right lower lung infiltrates again noted with slight improvement in aeration. Continued follow-up exams suggested to demonstrate clearing and to exclude underlying mass. Small right pleural effusion again noted. No pneumothorax. Thoracic spine scoliosis. IMPRESSION: 1. Endotracheal tube stable position. NG tube noted with tip below left hemidiaphragm. 2. Right mid lung and right lower lung infiltrates again noted with slight improvement in aeration. Continued follow-up exam suggested to demonstrate clearing and to exclude underlying mass. Electronically Signed   By: Marcello Moores  Register   On: 07/07/2018 06:16   Dg Chest Port 1 View  Result Date: 07/06/2018 CLINICAL DATA:  Respiratory failure. Altered mental status. EXAM: PORTABLE CHEST 1 VIEW COMPARISON:  Chest radiograph 07/03/2018 and CT 07/04/2018 FINDINGS: Endotracheal tube terminates at the clavicular heads. Enteric tube has been removed since the prior radiographs were obtained. The cardiomediastinal silhouette is unchanged. There is persistent consolidation throughout the right lower lobe. They are is also patchy right upper lobe and retrocardiac left lower lobe  opacity as seen on CT. No pneumothorax is identified. IMPRESSION: Unchanged pneumonia with extensive right lower lobe involvement. Electronically Signed   By: Logan Bores M.D.   On: 07/06/2018 07:37     Assessment/Plan: Polymicrobial bacteremia- staph aureus, E.coli and GBS bacteremia- ??source ? Lung, but resp culture neg Will need TEE to look for veegtation Pt currently on cefazolin as all three organisms susceptible to it   Leucocytosis- much improved  Acute hypoxic respiratory failure intubated-   Rt lower lobe pneumonia in a patient with cavitary lesion- Investigated for TB in march with 3 sputum and one bronch specimen- all negative including NAAT  Acute renal failure- Due to ATN from sepsison CRRT, anuric ? HIV- cd4 is 403, VL 29K in Feb 2020- not on HAART as not engaged in care-repeat ;labs cd4 is 45-  sent beta d glucan- once able to take PO can start treatment  Discussed with intensivist

## 2018-07-07 NOTE — Progress Notes (Signed)
[facility]  ROUNDING NOTE   Subjective:   Anuric  Not requiring vasopressors.   Objective:  Vital signs in last 24 hours:  Temp:  [97.1 F (36.2 C)-97.9 F (36.6 C)] 97.5 F (36.4 C) (06/15 0732) Pulse Rate:  [60-80] 76 (06/15 0900) Resp:  [9-18] 9 (06/15 0900) BP: (89-121)/(66-84) 113/75 (06/15 0900) SpO2:  [93 %-100 %] 97 % (06/15 0842) FiO2 (%):  [30 %-40 %] 30 % (06/15 0754) Weight:  [70.8 kg] 70.8 kg (06/15 0414)  Weight change: -1.9 kg Filed Weights   07/05/18 1830 07/06/18 0423 07/07/18 0414  Weight: 71.7 kg 73.9 kg 70.8 kg    Intake/Output: I/O last 3 completed shifts: In: 2321 [I.V.:1889.1; NG/GT:135.7; IV Piggyback:296.1] Out: -    Intake/Output this shift:  No intake/output data recorded.  Physical Exam: General: Critically ill  Head: +temporal wasting  Eyes: Anicteric  Neck:   trachea midline  Lungs:  Basilar rales, PSV 30%  Heart: regular  Abdomen:  Soft, bowel sounds present  Extremities: no peripheral edema.  Neurologic: Sedated, intubated  Skin: No lesions  Access: Left femoral temporary dialysis catheter    Basic Metabolic Panel: Recent Labs  Lab 07/04/18 1949 07/05/18 0003 07/05/18 0324 07/05/18 0746 07/06/18 0404 07/07/18 0403  NA 133* 135 134* 135 135 136  K 4.2 4.4 4.4 4.5 4.5 5.0  CL 101 103 102 103 100 102  CO2 23 24 25 26 26 26   GLUCOSE 184* 129* 124* 122* 178* 150*  BUN 35* 32* 29* 31* 44* 68*  CREATININE 1.65* 1.51* 1.27* 1.35* 1.97* 3.02*  CALCIUM 7.7* 8.1* 7.9* 8.1* 7.7* 7.6*  MG 2.1 2.0 1.9 1.9 2.0 2.2  PHOS 2.6 2.7 2.5 2.4*  --  5.0*    Liver Function Tests: Recent Labs  Lab 07/01/18 0206  07/05/18 0003 07/05/18 0324 07/05/18 0746 07/06/18 0404 07/07/18 0403  AST 83*  --   --   --   --  92* 56*  ALT 18  --   --   --   --  21 10  ALKPHOS 128*  --   --   --   --  116 105  BILITOT QUANTITY NOT SUFFICIENT, UNABLE TO PERFORM TEST  --   --   --   --  3.0* 1.9*  PROT 7.7  --   --   --   --  5.9*  5.4*  ALBUMIN QUANTITY NOT SUFFICIENT, UNABLE TO PERFORM TEST   < > 1.6* 1.6* 1.6* 1.8* 1.6*   < > = values in this interval not displayed.   No results for input(s): LIPASE, AMYLASE in the last 168 hours. No results for input(s): AMMONIA in the last 168 hours.  CBC: Recent Labs  Lab 07/01/18 1400  07/02/18 1151 07/03/18 0345 07/04/18 0340 07/05/18 0447 07/06/18 0404 07/07/18 0403  WBC 5.0   < > 16.0* 26.2* 24.4* 21.8* 18.9* 11.2*  NEUTROABS 4.2  --  14.4* 19.6* 21.0* 18.8*  --   --   HGB 13.2   < > 12.4* 11.4* 9.3* 10.7* 10.7* 10.0*  HCT 39.7   < > 33.7* 31.3* 26.1* 30.3* 30.4* 29.0*  MCV 100.3*   < > 92 92.3 92.6 94.7 95.0 96.7  PLT 236   < > 114* 113* 86* 100* 111* 104*   < > = values in this interval not displayed.    Cardiac Enzymes: Recent Labs  Lab 07/01/18 0206 07/01/18 0431  CKTOTAL  --  220  TROPONINI QUANTITY  NOT SUFFICIENT, UNABLE TO PERFORM TEST 0.05*    BNP: Invalid input(s): POCBNP  CBG: Recent Labs  Lab 07/06/18 1612 07/06/18 1935 07/07/18 0013 07/07/18 0418 07/07/18 0734  GLUCAP 147* 157* 131* 127* 138*    Microbiology: Results for orders placed or performed during the hospital encounter of 07/01/18  SARS Coronavirus 2 (CEPHEID- Performed in [place] hospital lab), Hosp Order     Status: None   Collection Time: 07/01/18  1:19 AM   Specimen: Nasopharyngeal Swab  Result Value Ref Range Status   SARS Coronavirus 2 NEGATIVE NEGATIVE Final    Comment: (NOTE) If result is NEGATIVE SARS-CoV-2 target nucleic acids are NOT DETECTED. The SARS-CoV-2 RNA is generally detectable in upper and lower  respiratory specimens during the acute phase of infection. The lowest  concentration of SARS-CoV-2 viral copies this assay can detect is 250  copies / mL. A negative result does not preclude SARS-CoV-2 infection  and should not be used as the sole basis for treatment or other  patient management decisions.  A negative result may occur with  improper  specimen collection / handling, submission of specimen other  than nasopharyngeal swab, presence of viral mutation(s) within the  areas targeted by this assay, and inadequate number of viral copies  (<250 copies / mL). A negative result must be combined with clinical  observations, patient history, and epidemiological information. If result is POSITIVE SARS-CoV-2 target nucleic acids are DETECTED. The SARS-CoV-2 RNA is generally detectable in upper and lower  respiratory specimens dur ing the acute phase of infection.  Positive  results are indicative of active infection with SARS-CoV-2.  Clinical  correlation with patient history and other diagnostic information is  necessary to determine patient infection status.  Positive results do  not rule out bacterial infection or co-infection with other viruses. If result is PRESUMPTIVE POSTIVE SARS-CoV-2 nucleic acids MAY BE PRESENT.   A presumptive positive result was obtained on the submitted specimen  and confirmed on repeat testing.  While 2019 novel coronavirus  (SARS-CoV-2) nucleic acids may be present in the submitted sample  additional confirmatory testing may be necessary for epidemiological  and / or clinical management purposes  to differentiate between  SARS-CoV-2 and other Sarbecovirus currently known to infect humans.  If clinically indicated additional testing with an alternate test  methodology [phone]) is advised. The SARS-CoV-2 RNA is generally  detectable in upper and lower respiratory sp ecimens during the acute  phase of infection. The expected result is Negative. Fact Sheet for Patients:  StrictlyIdeas.no Fact Sheet for Healthcare Providers: BankingDealers.co.za This test is not yet approved or cleared by the [place] FDA and has been authorized for detection and/or diagnosis of SARS-CoV-2 by FDA under an Emergency Use Authorization (EUA).  This EUA will remain in  effect (meaning this test can be used) for the duration of the COVID-19 declaration under Section 564(b)(1) of the Act, 21 U.S.C. section 360bbb-3(b)(1), unless the authorization is terminated or revoked sooner. Performed at [facility], [address] [place], [place] [address]   Blood culture (routine x 2)     Status: Abnormal   Collection Time: 07/01/18  1:19 AM   Specimen: BLOOD  Result Value Ref Range Status   Specimen Description   Final    BLOOD LEFT ANTECUBITAL Performed at [facility], [place] [address] [place], [place] [address]    Special Requests   Final    BOTTLES DRAWN AEROBIC AND ANAEROBIC Blood Culture results may  not be optimal due to an excessive volume of blood received in culture bottles Performed at [facility], [address] [place], [place] [address]    Culture  Setup Time   Final    GRAM NEGATIVE RODS IN BOTH AEROBIC AND ANAEROBIC BOTTLES CRITICAL RESULT CALLED TO, READ BACK BY AND VERIFIED WITH: CHRISTINE KATSOUDAS AT 4158 07/01/18 SG Performed at [facility], [address] [place], [place] [address]    Culture ESCHERICHIA COLI (A)  Final   Report Status 07/03/2018 FINAL  Final   Organism ID, Bacteria ESCHERICHIA COLI  Final      Susceptibility   Escherichia coli - MIC*    AMPICILLIN 4 SENSITIVE Sensitive     CEFAZOLIN <=4 SENSITIVE Sensitive     CEFEPIME <=1 SENSITIVE Sensitive     CEFTAZIDIME <=1 SENSITIVE Sensitive     CEFTRIAXONE <=1 SENSITIVE Sensitive     CIPROFLOXACIN <=0.25 SENSITIVE Sensitive     GENTAMICIN <=1 SENSITIVE Sensitive     IMIPENEM <=0.25 SENSITIVE Sensitive     TRIMETH/SULFA <=20 SENSITIVE Sensitive     AMPICILLIN/SULBACTAM <=2 SENSITIVE Sensitive     PIP/TAZO <=4 SENSITIVE Sensitive     Extended ESBL NEGATIVE Sensitive     * ESCHERICHIA COLI  Blood culture (routine x 2)     Status: Abnormal   Collection Time: 07/01/18  3:09 AM   Specimen: BLOOD  Result Value Ref Range Status    Specimen Description   Final    BLOOD LEFT ARM Performed at [facility], [address] [place], [place] [address]    Special Requests   Final    BOTTLES DRAWN AEROBIC AND ANAEROBIC Blood Culture adequate volume Performed at [facility], [place], [place] [address]    Culture  Setup Time   Final    GRAM POSITIVE COCCI IN BOTH AEROBIC AND ANAEROBIC BOTTLES GRAM NEGATIVE RODS IN BOTH AEROBIC AND ANAEROBIC BOTTLES CRITICAL RESULT CALLED TO, READ BACK BY AND VERIFIED WITH: CHRISTINE KATSOUDAS AT 1594 07/01/18 SG Performed at [facility], [place] [address], [place], [place] [address]    Culture (A)  Final    ESCHERICHIA COLI ORGANISM 1 SUSCEPTIBILITIES PERFORMED ON PREVIOUS CULTURE WITHIN THE LAST 5 DAYS. STAPHYLOCOCCUS AUREUS VIRIDANS STREPTOCOCCUS THE SIGNIFICANCE OF ISOLATING THIS ORGANISM FROM A SINGLE SET OF BLOOD CULTURES WHEN MULTIPLE SETS ARE DRAWN IS UNCERTAIN. PLEASE NOTIFY THE MICROBIOLOGY DEPARTMENT WITHIN ONE WEEK IF SPECIATION AND SENSITIVITIES ARE REQUIRED. GROUP B STREP(S.AGALACTIAE)ISOLATED    Report Status 07/04/2018 FINAL  Final   Organism ID, Bacteria STAPHYLOCOCCUS AUREUS  Final   Organism ID, Bacteria GROUP B STREP(S.AGALACTIAE)ISOLATED  Final      Susceptibility   Group b strep(s.agalactiae)isolated - MIC*    CLINDAMYCIN >=1 RESISTANT Resistant     AMPICILLIN <=0.25 SENSITIVE Sensitive     ERYTHROMYCIN >=8 RESISTANT Resistant     VANCOMYCIN 0.5 SENSITIVE Sensitive     CEFTRIAXONE <=0.12 SENSITIVE Sensitive     LEVOFLOXACIN 1 SENSITIVE Sensitive     * GROUP B STREP(S.AGALACTIAE)ISOLATED   Staphylococcus aureus - MIC*    CIPROFLOXACIN 1 SENSITIVE Sensitive     ERYTHROMYCIN >=8 RESISTANT Resistant     GENTAMICIN <=0.5 SENSITIVE Sensitive     OXACILLIN 0.5 SENSITIVE Sensitive     TETRACYCLINE <=1 SENSITIVE Sensitive     VANCOMYCIN 1 SENSITIVE Sensitive     TRIMETH/SULFA <=10 SENSITIVE Sensitive     CLINDAMYCIN RESISTANT  Resistant     RIFAMPIN <=0.5  SENSITIVE Sensitive     Inducible Clindamycin POSITIVE Resistant     * STAPHYLOCOCCUS AUREUS  Blood Culture ID Panel (Reflexed)     Status: Abnormal   Collection Time: 07/01/18  3:09 AM  Result Value Ref Range Status   Enterococcus species NOT DETECTED NOT DETECTED Final   Listeria monocytogenes NOT DETECTED NOT DETECTED Final   Staphylococcus species DETECTED (A) NOT DETECTED Final    Comment: CRITICAL RESULT CALLED TO, READ BACK BY AND VERIFIED WITH: CHRISTINE KATSOUDAS AT 1610 07/01/18 SG    Staphylococcus aureus (BCID) DETECTED (A) NOT DETECTED Final    Comment: Methicillin (oxacillin) susceptible Staphylococcus aureus (MSSA). Preferred therapy is anti staphylococcal beta lactam antibiotic (Cefazolin or Nafcillin), unless clinically contraindicated. CRITICAL RESULT CALLED TO, READ BACK BY AND VERIFIED WITH: CHRISTINE KATSOUDAS AT 9604 07/01/18 SG    Methicillin resistance NOT DETECTED NOT DETECTED Final   Streptococcus species DETECTED (A) NOT DETECTED Final    Comment: CRITICAL RESULT CALLED TO, READ BACK BY AND VERIFIED WITH: CHRISTINE KATSOUDAS AT 1325 07/01/18 SG    Streptococcus agalactiae DETECTED (A) NOT DETECTED Final    Comment: CRITICAL RESULT CALLED TO, READ BACK BY AND VERIFIED WITH: CHRISTINE KATSOUDAS AT 5409 07/01/18 SG    Streptococcus pneumoniae NOT DETECTED NOT DETECTED Final   Streptococcus pyogenes NOT DETECTED NOT DETECTED Final   Acinetobacter baumannii NOT DETECTED NOT DETECTED Final   Enterobacteriaceae species DETECTED (A) NOT DETECTED Final    Comment: Enterobacteriaceae represent a large family of gram-negative bacteria, not a single organism. CRITICAL RESULT CALLED TO, READ BACK BY AND VERIFIED WITH: CHRISTINE KATSOUDAS AT 8119 07/01/18 SG    Enterobacter cloacae complex NOT DETECTED NOT DETECTED Final   Escherichia coli DETECTED (A) NOT DETECTED Final    Comment: CRITICAL RESULT CALLED TO, READ BACK BY AND VERIFIED  WITH: CHRISTINE KATSOUDAS AT 1478 07/01/18 SG    Klebsiella oxytoca NOT DETECTED NOT DETECTED Final   Klebsiella pneumoniae NOT DETECTED NOT DETECTED Final   Proteus species NOT DETECTED NOT DETECTED Final   Serratia marcescens NOT DETECTED NOT DETECTED Final   Carbapenem resistance NOT DETECTED NOT DETECTED Final   Haemophilus influenzae NOT DETECTED NOT DETECTED Final   Neisseria meningitidis NOT DETECTED NOT DETECTED Final   Pseudomonas aeruginosa NOT DETECTED NOT DETECTED Final   Candida albicans NOT DETECTED NOT DETECTED Final   Candida glabrata NOT DETECTED NOT DETECTED Final   Candida krusei NOT DETECTED NOT DETECTED Final   Candida parapsilosis NOT DETECTED NOT DETECTED Final   Candida tropicalis NOT DETECTED NOT DETECTED Final    Comment: Performed at [facility], [address] [place], [name] [address]  MRSA PCR Screening     Status: None   Collection Time: 07/01/18  9:53 AM   Specimen: Nasopharyngeal  Result Value Ref Range Status   MRSA by PCR NEGATIVE NEGATIVE Final    Comment:        The GeneXpert MRSA Assay (FDA approved for NASAL specimens only), is one component of a comprehensive MRSA colonization surveillance program. It is not intended to diagnose MRSA infection nor to guide or monitor treatment for MRSA infections. Performed at [facility], [address] [place], [place] [address]   Culture, respiratory (non-expectorated)     Status: None   Collection Time: 07/02/18  6:48 PM   Specimen: Tracheal Aspirate; Respiratory  Result Value Ref Range Status   Specimen Description   Final    TRACHEAL ASPIRATE Performed at [facility], [place]  [address] [place], [place] [address]    Special Requests   Final    NONE Performed at [facility], [address] [place], [place] [address]    Gram Stain   Final    RARE WBC PRESENT, PREDOMINANTLY PMN NO ORGANISMS SEEN    Culture   Final    RARE Consistent with normal  respiratory flora. Performed at [facility], [place] [address], [place], [place] [address]    Report Status 07/05/2018 FINAL  Final  Culture, blood (routine x 2)     Status: None (Preliminary result)   Collection Time: 07/03/18 12:51 AM   Specimen: BLOOD  Result Value Ref Range Status   Specimen Description BLOOD RIGHT HAND  Final   Special Requests   Final    BOTTLES DRAWN AEROBIC AND ANAEROBIC Blood Culture adequate volume   Culture   Final    NO GROWTH 3 DAYS Performed at [facility], [address] [place], [place] [address]    Report Status PENDING  Incomplete  Culture, blood (routine x 2)     Status: None (Preliminary result)   Collection Time: 07/03/18 12:58 AM   Specimen: BLOOD  Result Value Ref Range Status   Specimen Description BLOOD LEFT HAND  Final   Special Requests   Final    BOTTLES DRAWN AEROBIC AND ANAEROBIC Blood Culture adequate volume   Culture   Final    NO GROWTH 3 DAYS Performed at [facility], [address] [place], [place] [address]    Report Status PENDING  Incomplete    Coagulation Studies: No results for input(s): LABPROT, INR in the last 72 hours.  Urinalysis: No results for input(s): COLORURINE, LABSPEC, PHURINE, GLUCOSEU, HGBUR, BILIRUBINUR, KETONESUR, PROTEINUR, UROBILINOGEN, NITRITE, LEUKOCYTESUR in the last 72 hours.  Invalid input(s): APPERANCEUR    Imaging: Dg Abd 1 View  Result Date: 07/06/2018 CLINICAL DATA:  47 y/o  M; NG tube placement. EXAM: ABDOMEN - 1 VIEW COMPARISON:  07/01/2018 abdomen radiograph. 07/06/2018 chest radiograph. FINDINGS: Normal bowel gas pattern. Bones are unremarkable. Nasogastric tube projects over the gastric body with proximal side hole at the gastroesophageal junction. Right lung base opacity again noted. IMPRESSION: Nasogastric tube projects over the gastric body with proximal side hole at the gastroesophageal junction. Electronically Signed   By: Kristine Garbe M.D.    On: 07/06/2018 22:31   US Renal  Result Date: 07/06/2018 CLINICAL DATA:  Acute renal failure EXAM: RENAL / URINARY TRACT ULTRASOUND COMPLETE COMPARISON:  None. FINDINGS: Right Kidney: Renal measurements: 11.2 x 5.6 x 4.7 cm = volume: 154 mL. Increased echogenicity compatible with medical renal disease. No hydronephrosis or focal abnormality. Left Kidney: Renal measurements: 11.2 x 5.2 x 6.1 cm = volume: 187 mL. Similar increased renal echogenicity compatible with medical renal disease. No focal abnormality or hydronephrosis. Bladder: Appears normal for degree of bladder distention. Small amount of diffuse abdominopelvic ascites noted. IMPRESSION: Increased renal echogenicity bilaterally compatible with ongoing medical renal disease. No hydronephrosis. Small amount of abdominopelvic ascites Electronically Signed   By: Jerilynn Mages.  Shick M.D.   On: 07/06/2018 14:17   Dg Chest Port 1 View  Result Date: 07/07/2018 CLINICAL DATA:  Respiratory distress. EXAM: PORTABLE CHEST 1 VIEW COMPARISON:  07/06/2018. FINDINGS: Endotracheal tube stable position. NG tube noted with tip below left hemidiaphragm. Heart size stable. Right mid lung and right lower lung infiltrates again noted with slight improvement in aeration. Continued follow-up exams suggested to demonstrate clearing and to exclude underlying  mass. Small right pleural effusion again noted. No pneumothorax. Thoracic spine scoliosis. IMPRESSION: 1. Endotracheal tube stable position. NG tube noted with tip below left hemidiaphragm. 2. Right mid lung and right lower lung infiltrates again noted with slight improvement in aeration. Continued follow-up exam suggested to demonstrate clearing and to exclude underlying mass. Electronically Signed   By: Marcello Moores  Register   On: 07/07/2018 06:16   Dg Chest Port 1 View  Result Date: 07/06/2018 CLINICAL DATA:  Respiratory failure. Altered mental status. EXAM: PORTABLE CHEST 1 VIEW COMPARISON:  Chest radiograph 07/03/2018 and CT  07/04/2018 FINDINGS: Endotracheal tube terminates at the clavicular heads. Enteric tube has been removed since the prior radiographs were obtained. The cardiomediastinal silhouette is unchanged. There is persistent consolidation throughout the right lower lobe. They are is also patchy right upper lobe and retrocardiac left lower lobe opacity as seen on CT. No pneumothorax is identified. IMPRESSION: Unchanged pneumonia with extensive right lower lobe involvement. Electronically Signed   By: Logan Bores M.D.   On: 07/06/2018 07:37     Medications:   . sodium chloride Stopped (07/04/18 1446)  .  ceFAZolin (ANCEF) IV 1 g (07/07/18 0500)  . dexmedetomidine (PRECEDEX) IV infusion Stopped (07/07/18 0840)  . fentaNYL infusion INTRAVENOUS Stopped (07/07/18 0840)  . norepinephrine (LEVOPHED) Adult infusion Stopped (07/06/18 0521)  . propofol (DIPRIVAN) infusion Stopped (07/07/18 0732)  . vasopressin (PITRESSIN) infusion - *FOR SHOCK* Stopped (07/04/18 0604)   . B-complex with vitamin C  1 tablet Per Tube QHS  . chlorhexidine gluconate (MEDLINE KIT)  15 mL Mouth Rinse BID  . Chlorhexidine Gluconate Cloth  6 each Topical Q0600  . docusate sodium  100 mg Oral BID  . feeding supplement (VITAL HIGH PROTEIN)  1,000 mL Per Tube Q24H  . heparin injection (subcutaneous)  5,000 Units Subcutaneous Q12H  . hydrocortisone sod succinate (SOLU-CORTEF) inj  50 mg Intravenous Q6H  . insulin aspart  0-15 Units Subcutaneous Q4H  . ipratropium-albuterol  3 mL Nebulization Q4H  . mouth rinse  15 mL Mouth Rinse 10 times per day  . pantoprazole (PROTONIX) IV  40 mg Intravenous Q24H  . senna  1 tablet Oral Daily  . sodium chloride flush  10-40 mL Intracatheter Q12H   sodium chloride, acetaminophen **OR** acetaminophen, alteplase, heparin, ondansetron **OR** ondansetron (ZOFRAN) IV, sodium chloride flush, traZODone  Assessment/ Plan:    Joshua Dougherty is a 48 y.o. black male HIV currently not on treatment,  tuberculosis, history of abscess drainage, who was admitted to [facility] on 07/01/2018 for evaluation of altered mental status and unresponsiveness.  Patient admitted with severe right lower lobe pneumonia, acute renal failure, and respiratory failure. On CRRT from 6/10-6/12. Then intermittent hemodialysis on 6/13.   1.  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 Next treatment for tomorrow.   2.  Septic shock/hypotension.    Patient remains off of pressors. - Cefepime  3.  Acute respiratory failure secondary to right lower lobe pneumonia.  Requiring mechanical ventilation   LOS: 6 Joshua Dougherty 6/15/202010:36 AM

## 2018-07-07 NOTE — Progress Notes (Signed)
Nutrition Follow-up  DOCUMENTATION CODES:   Not applicable  INTERVENTION:  If patient does not extubate, switch to Vital AF 1.2 at 40 mL/hr and advance to goal rate of 60 mL/hr (1440 mL goal daily volume) after 8 hours. Goal regimen provides 1728 kcal, 108 grams of protein, 1166 mL H2O daily.  Provide minimum free water flush of 30 mL Q4hrs to maintain tube patency.  New goal regimen will meet 100% RDIs for vitamins/minerals.  Continue B-complex with C QHS to replace losses from HD.  NUTRITION DIAGNOSIS:   Inadequate oral intake related to inability to eat as evidenced by NPO status.  Ongoing - addressing with TF regimen.  GOAL:   Provide needs based on ASPEN/SCCM guidelines  Met with TF regimen.  MONITOR:   Vent status, Labs, Weight trends, TF tolerance, I & O's  REASON FOR ASSESSMENT:   Ventilator, Consult Enteral/tube feeding initiation and management  ASSESSMENT:   48 year old male with PMHx of Tb (dx 02/2018) and HIV (not currently on treatment) admitted with acute hypoxic and hypercapnic respiratory failure requiring intubation on 6/9, AKI, septic/hypovolemic shock, also suffered transient cardiac arrest.  Patient remains intubated and sedated. On PSV/CPAP with FiO2 40%, Pressure Support 10 cmH2O, and PEEP 5 cmH2O. Abdomen soft per RN documentation. Last BM was on 6/9. Patient had HD on 6/13. Per Nephrology note next HD treatment for tomorrow.  Enteral Access: NGT placed 6/14; terminates in stomach per abdominal x-ray 6/14; tube not currently documented in chart so cm marking not documented  MAP: 75-88 mmHg  TF: Vital High Protein at 20 mL/hr  Patient is currently intubated on ventilator support Ve: 10.4 L/min Temp (24hrs), Avg:97.6 F (36.4 C), Min:97.1 F (36.2 C), Max:97.9 F (36.6 C)  Propofol: N/A  Medications reviewed and include: B-complex with C QHS per tube, Colace 100 mg BID, Novolog 0-15 units Q4hrs, pantoprazole, senna 1 tablet daily,  cefazolin, Precedex gtt, fentanyl gtt.  Labs reviewed: CBG 127-138, BUN 68, Creatinine 3.02, Phosphorus 5.  I/O: pt anuric  Weight trend: 70.8 kg on 6/15; -2 kg from 6/11  Plan is to attempt SBT later today. If patient does not extubate today plan is to advance tube feeds towards goal.  Diet Order:   Diet Order            Diet NPO time specified  Diet effective now             EDUCATION NEEDS:   No education needs have been identified at this time  Skin:  Skin Assessment: Reviewed RN Assessment  Last BM:  07/01/2018 per chart  Height:   Ht Readings from Last 1 Encounters:  07/06/18 6' 0.01" (1.829 m)   Weight:   Wt Readings from Last 1 Encounters:  07/07/18 70.8 kg   Ideal Body Weight:  80.9 kg  BMI:  Body mass index is 21.16 kg/m.  Estimated Nutritional Needs:   Kcal:  1740 (PSU 2003b w/ MSJ 1581, Ve 10.4, Tmax 36.6)  Protein:  110-130 grams (1.5-1.8 grams/kg)  Fluid:  2 L/day  Willey Blade, MS, RD, LDN Office: [phone] Pager: [phone] After Hours/Weekend Pager: [phone]

## 2018-07-07 NOTE — Progress Notes (Signed)
[facility] [place] NAME: Joshua Dougherty    MR#:  [id number]  DATE OF BIRTH:  October 01, 1970  SUBJECTIVE:  CHIEF COMPLAINT:   Chief Complaint  Patient presents with  . Altered Mental Status  . Respiratory Distress   No new complaints reported by nursing staff.  Patient remains sedated on the vent.  REVIEW OF SYSTEMS:  ROS Unobtainable due to patient being on the vent. DRUG ALLERGIES:  No Known Allergies VITALS:  Blood pressure 112/76, pulse 77, temperature 97.7 F (36.5 C), temperature source Oral, resp. rate 19, height 6' 0.01" (1.829 m), weight 70.8 kg, SpO2 98 %. PHYSICAL EXAMINATION:  Physical Exam  GENERAL:  48 y.o.-year-old patient lying in the bed critically ill.    Sedated on the vent  EYES: Pupils equal, round, reactive to light . No scleral icterus.   HEENT: Head atraumatic, normocephalic.  Orally intubated.   NECK:  Supple, no jugular venous distention. No thyroid enlargement, no tenderness.  LUNGS: Coarse breath sounds bilaterally.Marland Kitchen  CARDIOVASCULAR: S1, S2 normal. No murmurs, rubs, or gallops.  ABDOMEN: Soft, nontender, nondistended. Bowel sounds present. No organomegaly or mass.  EXTREMITIES: No pedal edema, cyanosis, or clubbing.  NEUROLOGIC: Intubated, sedated   pSYCHIATRIC sedated SKIN: No obvious rash, lesion, or ulcer.   LABORATORY PANEL:  Male CBC Recent Labs  Lab 07/07/18 0403  WBC 11.2*  HGB 10.0*  HCT 29.0*  PLT 104*   ------------------------------------------------------------------------------------------------------------------ Chemistries  Recent Labs  Lab 07/07/18 0403  NA 136  K 5.0  CL 102  CO2 26  GLUCOSE 150*  BUN 68*  CREATININE 3.02*  CALCIUM 7.6*  MG 2.2  AST 56*  ALT 10  ALKPHOS 105  BILITOT 1.9*   RADIOLOGY:  Dg Abd 1 View  Result Date: 07/06/2018 CLINICAL DATA:  48 y/o  M; NG tube placement. EXAM: ABDOMEN - 1 VIEW COMPARISON:  07/01/2018 abdomen radiograph. 07/06/2018  chest radiograph. FINDINGS: Normal bowel gas pattern. Bones are unremarkable. Nasogastric tube projects over the gastric body with proximal side hole at the gastroesophageal junction. Right lung base opacity again noted. IMPRESSION: Nasogastric tube projects over the gastric body with proximal side hole at the gastroesophageal junction. Electronically Signed   By: Kristine Garbe M.D.   On: 07/06/2018 22:31   Ct Head Wo Contrast  Result Date: 07/07/2018 CLINICAL DATA:  Altered mental status. Respiratory distress. History of HIV and tuberculosis. EXAM: CT HEAD WITHOUT CONTRAST TECHNIQUE: Contiguous axial images were obtained from the base of the skull through the vertex without intravenous contrast. COMPARISON:  None. FINDINGS: Brain: No evidence for acute infarction, hemorrhage, mass lesion, hydrocephalus, or extra-axial fluid. Mild atrophy, premature for age. Hypoattenuation of white matter, likely small vessel disease. Old RIGHT frontal infarct. Vascular: No hyperdense vessel or unexpected calcification. Skull: Normal. Negative for fracture or focal lesion. Sinuses/Orbits: No acute finding. Other: None. IMPRESSION: Chronic changes as described. No acute intracranial findings. Electronically Signed   By: Staci Righter M.D.   On: 07/07/2018 12:10   Dg Chest Port 1 View  Result Date: 07/07/2018 CLINICAL DATA:  Respiratory distress. EXAM: PORTABLE CHEST 1 VIEW COMPARISON:  07/06/2018. FINDINGS: Endotracheal tube stable position. NG tube noted with tip below left hemidiaphragm. Heart size stable. Right mid lung and right lower lung infiltrates again noted with slight improvement in aeration. Continued follow-up exams suggested to demonstrate clearing and to exclude underlying mass. Small right pleural effusion again noted. No pneumothorax. Thoracic spine scoliosis. IMPRESSION: 1. Endotracheal tube  stable position. NG tube noted with tip below left hemidiaphragm. 2. Right mid lung and right lower lung  infiltrates again noted with slight improvement in aeration. Continued follow-up exam suggested to demonstrate clearing and to exclude underlying mass. Electronically Signed   By: Marcello Moores  Register   On: 07/07/2018 06:16   ASSESSMENT AND PLAN:   48 year old male with history of AIDS, latent TB admitted for acute respiratory failure and septic shock  Acute respiratory failure due to septic shock, currently on full vent support, restarted the pressors.  Appreciate intensivist, ID following.  #1 .septic shock secondary to bilateral pneumonia, patient has polymicrobial bacteremia with staph aureus, E. coli, GBS source likely lung.,    Patient currently on Ancef.  Continue vasopressors repeat blood cultures no growth.  Leukocytosis gradually improving.  No fever.  2.  Acute respiratory failure, patient is intubated,, sedated.  History of cavitary pneumonia, admitted in February that time post sputum AFB that  were sent, which were negative, patient also had bronc following discharge which were negative for AFB.  3 .  Acute renal failure with ATN secondary to septic shock, ; renal function not improved yet.,  Patient was initially placed on CRRT by nephrology.  This was discontinued previously and patient started on on hemodialysis on 07/05/2018.   Renal ultrasound consistent with HIV-associated nephropathy.  Will need continued hemodialysis. We will dialysis tomorrow.  #4. HIV, recent CD4 count 403, not on Elnoria Howard as not engaged in the care as per ID documentation. Prognosis guarded, high risk for cardiac arrest.   Infectious disease specialist following.  DVT prophylaxis; heparin  All the records are reviewed and case discussed with Care Management/Social Worker. Management plans discussed with the patient, family; wife recently and they are in agreement.  CODE STATUS: DNR  TOTAL TIME TAKING CARE OF THIS PATIENT: 35 minutes.   More than 50% of the time was spent in  counseling/coordination of care: YES  POSSIBLE D/C IN 3 DAYS, DEPENDING ON CLINICAL CONDITION.   Kresha Abelson M.D on 07/07/2018 at 3:48 PM  Between 7am to 6pm - Pager - [phone]  After 6pm go to [email or web address] - Proofreader  [facility]  Office  [phone]  CC: Primary care physician; Patient, No Pcp Per  Note: This dictation was prepared with Dragon dictation along with smaller phrase technology. Any transcriptional errors that result from this process are unintentional.

## 2018-07-08 DIAGNOSIS — I311 Chronic constrictive pericarditis: Secondary | ICD-10-CM

## 2018-07-08 DIAGNOSIS — N08 Glomerular disorders in diseases classified elsewhere: Secondary | ICD-10-CM

## 2018-07-08 DIAGNOSIS — B20 Human immunodeficiency virus [HIV] disease: Secondary | ICD-10-CM | POA: Diagnosis present

## 2018-07-08 LAB — CBC WITH DIFFERENTIAL/PLATELET
Abs Immature Granulocytes: 0.23 10*3/uL — ABNORMAL HIGH (ref 0.00–0.07)
Basophils Absolute: 0 10*3/uL (ref 0.0–0.1)
Basophils Relative: 0 %
Eosinophils Absolute: 0 10*3/uL (ref 0.0–0.5)
Eosinophils Relative: 0 %
HCT: 30.7 % — ABNORMAL LOW (ref 39.0–52.0)
Hemoglobin: 10.5 g/dL — ABNORMAL LOW (ref 13.0–17.0)
Immature Granulocytes: 3 %
Lymphocytes Relative: 11 %
Lymphs Abs: 0.7 10*3/uL (ref 0.7–4.0)
MCH: 33.1 pg (ref 26.0–34.0)
MCHC: 34.2 g/dL (ref 30.0–36.0)
MCV: 96.8 fL (ref 80.0–100.0)
Monocytes Absolute: 0.6 10*3/uL (ref 0.1–1.0)
Monocytes Relative: 8 %
Neutro Abs: 5.2 10*3/uL (ref 1.7–7.7)
Neutrophils Relative %: 78 %
Platelets: 97 10*3/uL — ABNORMAL LOW (ref 150–400)
RBC: 3.17 MIL/uL — ABNORMAL LOW (ref 4.22–5.81)
RDW: 15.8 % — ABNORMAL HIGH (ref 11.5–15.5)
WBC: 6.8 10*3/uL (ref 4.0–10.5)
nRBC: 0.3 % — ABNORMAL HIGH (ref 0.0–0.2)

## 2018-07-08 LAB — CULTURE, BLOOD (ROUTINE X 2)
Culture: NO GROWTH
Culture: NO GROWTH
Special Requests: ADEQUATE
Special Requests: ADEQUATE

## 2018-07-08 LAB — GLUCOSE, CAPILLARY
Glucose-Capillary: 113 mg/dL — ABNORMAL HIGH (ref 70–99)
Glucose-Capillary: 119 mg/dL — ABNORMAL HIGH (ref 70–99)
Glucose-Capillary: 120 mg/dL — ABNORMAL HIGH (ref 70–99)
Glucose-Capillary: 130 mg/dL — ABNORMAL HIGH (ref 70–99)
Glucose-Capillary: 131 mg/dL — ABNORMAL HIGH (ref 70–99)
Glucose-Capillary: 137 mg/dL — ABNORMAL HIGH (ref 70–99)
Glucose-Capillary: 95 mg/dL (ref 70–99)

## 2018-07-08 LAB — BASIC METABOLIC PANEL
Anion gap: 10 (ref 5–15)
BUN: 96 mg/dL — ABNORMAL HIGH (ref 6–20)
CO2: 24 mmol/L (ref 22–32)
Calcium: 7.4 mg/dL — ABNORMAL LOW (ref 8.9–10.3)
Chloride: 102 mmol/L (ref 98–111)
Creatinine, Ser: 3.94 mg/dL — ABNORMAL HIGH (ref 0.61–1.24)
GFR calc Af Amer: 20 mL/min — ABNORMAL LOW (ref >60)
GFR calc non Af Amer: 17 mL/min — ABNORMAL LOW (ref >60)
Glucose, Bld: 142 mg/dL — ABNORMAL HIGH (ref 70–99)
Potassium: 4.3 mmol/L (ref 3.5–5.1)
Sodium: 136 mmol/L (ref 135–145)

## 2018-07-08 LAB — PROCALCITONIN: Procalcitonin: 13.37 ng/mL

## 2018-07-08 MED ORDER — IPRATROPIUM-ALBUTEROL 0.5-2.5 (3) MG/3ML IN SOLN: 3.0000 mL | RESPIRATORY_TRACT | Status: DC | PRN

## 2018-07-08 MED ORDER — MORPHINE SULFATE (PF) 2 MG/ML IV SOLN: 1.0000 mg | INTRAVENOUS | Status: DC | PRN

## 2018-07-08 NOTE — Progress Notes (Signed)
Pt extubated without complications, no stridor noted, placed on 4lpm [place],sats 97%, respiratory rate 18/min. Will continue to monitor.

## 2018-07-08 NOTE — Progress Notes (Signed)
Post HD Assessment    07/08/18 1245  Neurological  Level of Consciousness Alert  Orientation Level Oriented to person  Respiratory  Respiratory Pattern Regular  Chest Assessment Chest expansion symmetrical  Bilateral Breath Sounds Diminished  Cough None  Cardiac  Pulse Regular  Heart Sounds S1, S2  ECG Monitor Yes  Cardiac Rhythm NSR  Heart Block Type 1st degree AVB  Vascular  R Radial Pulse +2  L Radial Pulse +2  Edema Generalized  Generalized Edema +1  Psychosocial  Psychosocial (WDL) WDL  Patient Behaviors Calm;Cooperative  Emotional support given Given to patient

## 2018-07-08 NOTE — Progress Notes (Signed)
Pt pulled off tele leads and is refusing to let RN put them back on. Pt is to be transferred to the floor without tele monitoring. CCMD notified and tele DC.

## 2018-07-08 NOTE — Progress Notes (Signed)
During HD pt stated that he wants to die and he wants to speak with the doctor about stopping his treatment. Nephrology MD at the bedside and explained the importance of continuing treatment. Pt adamantly refusing care. HD discontinued. Will continue to monitor.

## 2018-07-08 NOTE — Progress Notes (Signed)
[facility]  ROUNDING NOTE   Subjective:   Anuric  Not requiring vasopressors.   Extubated this morning.   CT head negative  Objective:  Vital signs in last 24 hours:  Temp:  [97.6 F (36.4 C)-97.7 F (36.5 C)] 97.7 F (36.5 C) (06/16 0000) Pulse Rate:  [51-85] 52 (06/16 0500) Resp:  [10-19] 18 (06/16 0700) BP: (97-126)/(67-81) 99/67 (06/16 0700) SpO2:  [96 %-100 %] 98 % (06/16 0857) FiO2 (%):  [30 %-40 %] 40 % (06/16 0733)  Weight change:  Filed Weights   07/05/18 1830 07/06/18 0423 07/07/18 0414  Weight: 71.7 kg 73.9 kg 70.8 kg    Intake/Output: I/O last 3 completed shifts: In: 2452.7 [I.V.:1970.9; NG/GT:135.7; IV Piggyback:346.1] Out: -    Intake/Output this shift:  No intake/output data recorded.  Physical Exam: General: Critically ill  Head: +temporal wasting  Eyes: Anicteric  Neck:   trachea midline  Lungs:  Basilar rales   Heart: regular  Abdomen:  Soft, bowel sounds present  Extremities: no peripheral edema.  Neurologic: Sedated, intubated  Skin: No lesions  Access: Left femoral temporary dialysis catheter    Basic Metabolic Panel: Recent Labs  Lab 07/04/18 1949 07/05/18 0003 07/05/18 0324 07/05/18 0746 07/06/18 0404 07/07/18 0403 07/08/18 0600  NA 133* 135 134* 135 135 136 136  K 4.2 4.4 4.4 4.5 4.5 5.0 4.3  CL 101 103 102 103 100 102 102  CO2 23 24 25 26 26 26 24   GLUCOSE 184* 129* 124* 122* 178* 150* 142*  BUN 35* 32* 29* 31* 44* 68* 96*  CREATININE 1.65* 1.51* 1.27* 1.35* 1.97* 3.02* 3.94*  CALCIUM 7.7* 8.1* 7.9* 8.1* 7.7* 7.6* 7.4*  MG 2.1 2.0 1.9 1.9 2.0 2.2  --   PHOS 2.6 2.7 2.5 2.4*  --  5.0*  --     Liver Function Tests: Recent Labs  Lab 07/05/18 0003 07/05/18 0324 07/05/18 0746 07/06/18 0404 07/07/18 0403  AST  --   --   --  92* 56*  ALT  --   --   --  21 10  ALKPHOS  --   --   --  116 105  BILITOT  --   --   --  3.0* 1.9*  PROT  --   --   --  5.9* 5.4*  ALBUMIN 1.6* 1.6* 1.6* 1.8* 1.6*   No  results for input(s): LIPASE, AMYLASE in the last 168 hours. No results for input(s): AMMONIA in the last 168 hours.  CBC: Recent Labs  Lab 07/02/18 1151 07/03/18 0345 07/04/18 0340 07/05/18 0447 07/06/18 0404 07/07/18 0403 07/08/18 0600  WBC 16.0* 26.2* 24.4* 21.8* 18.9* 11.2* 6.8  NEUTROABS 14.4* 19.6* 21.0* 18.8*  --   --  5.2  HGB 12.4* 11.4* 9.3* 10.7* 10.7* 10.0* 10.5*  HCT 33.7* 31.3* 26.1* 30.3* 30.4* 29.0* 30.7*  MCV 92 92.3 92.6 94.7 95.0 96.7 96.8  PLT 114* 113* 86* 100* 111* 104* 97*    Cardiac Enzymes: No results for input(s): CKTOTAL, CKMB, CKMBINDEX, TROPONINI in the last 168 hours.  BNP: Invalid input(s): POCBNP  CBG: Recent Labs  Lab 07/07/18 1948 07/07/18 2333 07/08/18 0040 07/08/18 0335 07/08/18 0754  GLUCAP 138* 130* 131* 120* 137*    Microbiology: Results for orders placed or performed during the hospital encounter of 07/01/18  SARS Coronavirus 2 (CEPHEID- Performed in [place] hospital lab), Hosp Order     Status: None   Collection Time: 07/01/18  1:19 AM   Specimen: Nasopharyngeal  Swab  Result Value Ref Range Status   SARS Coronavirus 2 NEGATIVE NEGATIVE Final    Comment: (NOTE) If result is NEGATIVE SARS-CoV-2 target nucleic acids are NOT DETECTED. The SARS-CoV-2 RNA is generally detectable in upper and lower  respiratory specimens during the acute phase of infection. The lowest  concentration of SARS-CoV-2 viral copies this assay can detect is 250  copies / mL. A negative result does not preclude SARS-CoV-2 infection  and should not be used as the sole basis for treatment or other  patient management decisions.  A negative result may occur with  improper specimen collection / handling, submission of specimen other  than nasopharyngeal swab, presence of viral mutation(s) within the  areas targeted by this assay, and inadequate number of viral copies  (<250 copies / mL). A negative result must be combined with clinical  observations,  patient history, and epidemiological information. If result is POSITIVE SARS-CoV-2 target nucleic acids are DETECTED. The SARS-CoV-2 RNA is generally detectable in upper and lower  respiratory specimens dur ing the acute phase of infection.  Positive  results are indicative of active infection with SARS-CoV-2.  Clinical  correlation with patient history and other diagnostic information is  necessary to determine patient infection status.  Positive results do  not rule out bacterial infection or co-infection with other viruses. If result is PRESUMPTIVE POSTIVE SARS-CoV-2 nucleic acids MAY BE PRESENT.   A presumptive positive result was obtained on the submitted specimen  and confirmed on repeat testing.  While 2019 novel coronavirus  (SARS-CoV-2) nucleic acids may be present in the submitted sample  additional confirmatory testing may be necessary for epidemiological  and / or clinical management purposes  to differentiate between  SARS-CoV-2 and other Sarbecovirus currently known to infect humans.  If clinically indicated additional testing with an alternate test  methodology [id number]) is advised. The SARS-CoV-2 RNA is generally  detectable in upper and lower respiratory sp ecimens during the acute  phase of infection. The expected result is Negative. Fact Sheet for Patients:  StrictlyIdeas.no Fact Sheet for Healthcare Providers: BankingDealers.co.za This test is not yet approved or cleared by the [place] FDA and has been authorized for detection and/or diagnosis of SARS-CoV-2 by FDA under an Emergency Use Authorization (EUA).  This EUA will remain in effect (meaning this test can be used) for the duration of the COVID-19 declaration under Section 564(b)(1) of the Act, 21 U.S.C. section 360bbb-3(b)(1), unless the authorization is terminated or revoked sooner. Performed at [facility], [address]  [place], [place] [address]   Blood culture (routine x 2)     Status: Abnormal   Collection Time: 07/01/18  1:19 AM   Specimen: BLOOD  Result Value Ref Range Status   Specimen Description   Final    BLOOD LEFT ANTECUBITAL Performed at [facility], [place] [address], [place], [place] [address]    Special Requests   Final    BOTTLES DRAWN AEROBIC AND ANAEROBIC Blood Culture results may not be optimal due to an excessive volume of blood received in culture bottles Performed at [facility], [address] [place], [place] [address]    Culture  Setup Time   Final    GRAM NEGATIVE RODS IN BOTH AEROBIC AND ANAEROBIC BOTTLES CRITICAL RESULT CALLED TO, READ BACK BY AND VERIFIED WITH: CHRISTINE KATSOUDAS AT 6160 07/01/18 SG Performed at [facility], [address], [place], [place] [address]    Culture ESCHERICHIA COLI (A)  Final   Report Status 07/03/2018 FINAL  Final   Organism ID, Bacteria ESCHERICHIA COLI  Final      Susceptibility   Escherichia coli - MIC*    AMPICILLIN 4 SENSITIVE Sensitive     CEFAZOLIN <=4 SENSITIVE Sensitive     CEFEPIME <=1 SENSITIVE Sensitive     CEFTAZIDIME <=1 SENSITIVE Sensitive     CEFTRIAXONE <=1 SENSITIVE Sensitive     CIPROFLOXACIN <=0.25 SENSITIVE Sensitive     GENTAMICIN <=1 SENSITIVE Sensitive     IMIPENEM <=0.25 SENSITIVE Sensitive     TRIMETH/SULFA <=20 SENSITIVE Sensitive     AMPICILLIN/SULBACTAM <=2 SENSITIVE Sensitive     PIP/TAZO <=4 SENSITIVE Sensitive     Extended ESBL NEGATIVE Sensitive     * ESCHERICHIA COLI  Blood culture (routine x 2)     Status: Abnormal   Collection Time: 07/01/18  3:09 AM   Specimen: BLOOD  Result Value Ref Range Status   Specimen Description   Final    BLOOD LEFT ARM Performed at [facility], [address], [place], [place] [address]    Special Requests   Final    BOTTLES DRAWN AEROBIC AND ANAEROBIC Blood Culture adequate volume Performed at [facility], [place], [place] [address]    Culture  Setup Time   Final    GRAM POSITIVE COCCI IN BOTH AEROBIC AND ANAEROBIC BOTTLES GRAM NEGATIVE RODS IN BOTH AEROBIC AND ANAEROBIC BOTTLES CRITICAL RESULT CALLED TO, READ BACK BY AND VERIFIED WITH: CHRISTINE KATSOUDAS AT 1325 07/01/18 SG Performed at [facility], [place] [address] [place], [place] [address]    Culture (A)  Final    ESCHERICHIA COLI ORGANISM 1 SUSCEPTIBILITIES PERFORMED ON PREVIOUS CULTURE WITHIN THE LAST 5 DAYS. STAPHYLOCOCCUS AUREUS VIRIDANS STREPTOCOCCUS THE SIGNIFICANCE OF ISOLATING THIS ORGANISM FROM A SINGLE SET OF BLOOD CULTURES WHEN MULTIPLE SETS ARE DRAWN IS UNCERTAIN. PLEASE NOTIFY THE MICROBIOLOGY DEPARTMENT WITHIN ONE WEEK IF SPECIATION AND SENSITIVITIES ARE REQUIRED. GROUP B STREP(S.AGALACTIAE)ISOLATED    Report Status 07/04/2018 FINAL  Final   Organism ID, Bacteria STAPHYLOCOCCUS AUREUS  Final   Organism ID, Bacteria GROUP B STREP(S.AGALACTIAE)ISOLATED  Final      Susceptibility   Group b strep(s.agalactiae)isolated - MIC*    CLINDAMYCIN >=1 RESISTANT Resistant     AMPICILLIN <=0.25 SENSITIVE Sensitive     ERYTHROMYCIN >=8 RESISTANT Resistant     VANCOMYCIN 0.5 SENSITIVE Sensitive     CEFTRIAXONE <=0.12 SENSITIVE Sensitive     LEVOFLOXACIN 1 SENSITIVE Sensitive     * GROUP B STREP(S.AGALACTIAE)ISOLATED   Staphylococcus aureus - MIC*    CIPROFLOXACIN 1 SENSITIVE Sensitive     ERYTHROMYCIN >=8 RESISTANT Resistant     GENTAMICIN <=0.5 SENSITIVE Sensitive     OXACILLIN 0.5 SENSITIVE Sensitive     TETRACYCLINE <=1 SENSITIVE Sensitive     VANCOMYCIN 1 SENSITIVE Sensitive     TRIMETH/SULFA <=10 SENSITIVE Sensitive     CLINDAMYCIN RESISTANT Resistant     RIFAMPIN <=0.5 SENSITIVE Sensitive     Inducible Clindamycin POSITIVE Resistant     * STAPHYLOCOCCUS AUREUS  Blood Culture ID Panel (Reflexed)     Status: Abnormal   Collection Time: 07/01/18  3:09 AM  Result Value Ref Range Status   Enterococcus species NOT  DETECTED NOT DETECTED Final   Listeria monocytogenes NOT DETECTED NOT DETECTED Final   Staphylococcus species DETECTED (A) NOT DETECTED Final    Comment: CRITICAL RESULT CALLED TO, READ BACK BY AND VERIFIED WITH:  CHRISTINE KATSOUDAS AT 2956 07/01/18 SG    Staphylococcus aureus (BCID) DETECTED (A) NOT DETECTED Final    Comment: Methicillin (oxacillin) susceptible Staphylococcus aureus (MSSA). Preferred therapy is anti staphylococcal beta lactam antibiotic (Cefazolin or Nafcillin), unless clinically contraindicated. CRITICAL RESULT CALLED TO, READ BACK BY AND VERIFIED WITH: CHRISTINE KATSOUDAS AT 2130 07/01/18 SG    Methicillin resistance NOT DETECTED NOT DETECTED Final   Streptococcus species DETECTED (A) NOT DETECTED Final    Comment: CRITICAL RESULT CALLED TO, READ BACK BY AND VERIFIED WITH: CHRISTINE KATSOUDAS AT 1325 07/01/18 SG    Streptococcus agalactiae DETECTED (A) NOT DETECTED Final    Comment: CRITICAL RESULT CALLED TO, READ BACK BY AND VERIFIED WITH: CHRISTINE KATSOUDAS AT 8657 07/01/18 SG    Streptococcus pneumoniae NOT DETECTED NOT DETECTED Final   Streptococcus pyogenes NOT DETECTED NOT DETECTED Final   Acinetobacter baumannii NOT DETECTED NOT DETECTED Final   Enterobacteriaceae species DETECTED (A) NOT DETECTED Final    Comment: Enterobacteriaceae represent a large family of gram-negative bacteria, not a single organism. CRITICAL RESULT CALLED TO, READ BACK BY AND VERIFIED WITH: CHRISTINE KATSOUDAS AT 8469 07/01/18 SG    Enterobacter cloacae complex NOT DETECTED NOT DETECTED Final   Escherichia coli DETECTED (A) NOT DETECTED Final    Comment: CRITICAL RESULT CALLED TO, READ BACK BY AND VERIFIED WITH: CHRISTINE KATSOUDAS AT 6295 07/01/18 SG    Klebsiella oxytoca NOT DETECTED NOT DETECTED Final   Klebsiella pneumoniae NOT DETECTED NOT DETECTED Final   Proteus species NOT DETECTED NOT DETECTED Final   Serratia marcescens NOT DETECTED NOT DETECTED Final   Carbapenem resistance NOT  DETECTED NOT DETECTED Final   Haemophilus influenzae NOT DETECTED NOT DETECTED Final   Neisseria meningitidis NOT DETECTED NOT DETECTED Final   Pseudomonas aeruginosa NOT DETECTED NOT DETECTED Final   Candida albicans NOT DETECTED NOT DETECTED Final   Candida glabrata NOT DETECTED NOT DETECTED Final   Candida krusei NOT DETECTED NOT DETECTED Final   Candida parapsilosis NOT DETECTED NOT DETECTED Final   Candida tropicalis NOT DETECTED NOT DETECTED Final    Comment: Performed at [facility], [address] [place], [place] [address]  MRSA PCR Screening     Status: None   Collection Time: 07/01/18  9:53 AM   Specimen: Nasopharyngeal  Result Value Ref Range Status   MRSA by PCR NEGATIVE NEGATIVE Final    Comment:        The GeneXpert MRSA Assay (FDA approved for NASAL specimens only), is one component of a comprehensive MRSA colonization surveillance program. It is not intended to diagnose MRSA infection nor to guide or monitor treatment for MRSA infections. Performed at [facility], [address] [place], [place] [address]   Culture, respiratory (non-expectorated)     Status: None   Collection Time: 07/02/18  6:48 PM   Specimen: Tracheal Aspirate; Respiratory  Result Value Ref Range Status   Specimen Description   Final    TRACHEAL ASPIRATE Performed at [facility], [address] [place], [place] [address]    Special Requests   Final    NONE Performed at [facility], [address] [place], [place] [address]    Gram Stain   Final    RARE WBC PRESENT, PREDOMINANTLY PMN NO ORGANISMS SEEN    Culture   Final    RARE Consistent with normal respiratory flora. Performed at [facility], [place] [address], [place], [place] [address]    Report Status 07/05/2018 FINAL  Final  Culture, blood (routine x 2)     Status: None   Collection Time: 07/03/18 12:51 AM   Specimen: BLOOD  Result Value Ref Range Status   Specimen  Description BLOOD RIGHT HAND  Final   Special Requests   Final    BOTTLES DRAWN AEROBIC AND ANAEROBIC Blood Culture adequate volume   Culture   Final    NO GROWTH 5 DAYS Performed at [facility], [address], [place], [place] [address]    Report Status 07/08/2018 FINAL  Final  Culture, blood (routine x 2)     Status: None   Collection Time: 07/03/18 12:58 AM   Specimen: BLOOD  Result Value Ref Range Status   Specimen Description BLOOD LEFT HAND  Final   Special Requests   Final    BOTTLES DRAWN AEROBIC AND ANAEROBIC Blood Culture adequate volume   Culture   Final    NO GROWTH 5 DAYS Performed at [facility], [address] [place], [place] [address]    Report Status 07/08/2018 FINAL  Final    Coagulation Studies: No results for input(s): LABPROT, INR in the last 72 hours.  Urinalysis: No results for input(s): COLORURINE, LABSPEC, PHURINE, GLUCOSEU, HGBUR, BILIRUBINUR, KETONESUR, PROTEINUR, UROBILINOGEN, NITRITE, LEUKOCYTESUR in the last 72 hours.  Invalid input(s): APPERANCEUR    Imaging: Dg Abd 1 View  Result Date: 07/06/2018 CLINICAL DATA:  48 y/o  M; NG tube placement. EXAM: ABDOMEN - 1 VIEW COMPARISON:  07/01/2018 abdomen radiograph. 07/06/2018 chest radiograph. FINDINGS: Normal bowel gas pattern. Bones are unremarkable. Nasogastric tube projects over the gastric body with proximal side hole at the gastroesophageal junction. Right lung base opacity again noted. IMPRESSION: Nasogastric tube projects over the gastric body with proximal side hole at the gastroesophageal junction. Electronically Signed   By: Kristine Garbe M.D.   On: 07/06/2018 22:31   Ct Head Wo Contrast  Result Date: 07/07/2018 CLINICAL DATA:  Altered mental status. Respiratory distress. History of HIV and tuberculosis. EXAM: CT HEAD WITHOUT CONTRAST TECHNIQUE: Contiguous axial images were obtained from the base of the skull through the vertex without intravenous contrast.  COMPARISON:  None. FINDINGS: Brain: No evidence for acute infarction, hemorrhage, mass lesion, hydrocephalus, or extra-axial fluid. Mild atrophy, premature for age. Hypoattenuation of white matter, likely small vessel disease. Old RIGHT frontal infarct. Vascular: No hyperdense vessel or unexpected calcification. Skull: Normal. Negative for fracture or focal lesion. Sinuses/Orbits: No acute finding. Other: None. IMPRESSION: Chronic changes as described. No acute intracranial findings. Electronically Signed   By: Staci Righter M.D.   On: 07/07/2018 12:10   US Renal  Result Date: 07/06/2018 CLINICAL DATA:  Acute renal failure EXAM: RENAL / URINARY TRACT ULTRASOUND COMPLETE COMPARISON:  None. FINDINGS: Right Kidney: Renal measurements: 11.2 x 5.6 x 4.7 cm = volume: 154 mL. Increased echogenicity compatible with medical renal disease. No hydronephrosis or focal abnormality. Left Kidney: Renal measurements: 11.2 x 5.2 x 6.1 cm = volume: 187 mL. Similar increased renal echogenicity compatible with medical renal disease. No focal abnormality or hydronephrosis. Bladder: Appears normal for degree of bladder distention. Small amount of diffuse abdominopelvic ascites noted. IMPRESSION: Increased renal echogenicity bilaterally compatible with ongoing medical renal disease. No hydronephrosis. Small amount of abdominopelvic ascites Electronically Signed   By: Jerilynn Mages.  Shick M.D.   On: 07/06/2018 14:17   Dg Chest Port 1 View  Result Date: 07/07/2018 CLINICAL DATA:  Respiratory distress. EXAM: PORTABLE CHEST 1 VIEW COMPARISON:  07/06/2018. FINDINGS: Endotracheal tube stable position.  NG tube noted with tip below left hemidiaphragm. Heart size stable. Right mid lung and right lower lung infiltrates again noted with slight improvement in aeration. Continued follow-up exams suggested to demonstrate clearing and to exclude underlying mass. Small right pleural effusion again noted. No pneumothorax. Thoracic spine scoliosis.  IMPRESSION: 1. Endotracheal tube stable position. NG tube noted with tip below left hemidiaphragm. 2. Right mid lung and right lower lung infiltrates again noted with slight improvement in aeration. Continued follow-up exam suggested to demonstrate clearing and to exclude underlying mass. Electronically Signed   By: Marcello Moores  Register   On: 07/07/2018 06:16     Medications:   . sodium chloride Stopped (07/04/18 1446)  .  ceFAZolin (ANCEF) IV Stopped (07/07/18 1832)  . dexmedetomidine (PRECEDEX) IV infusion 0.8 mcg/kg/hr (07/08/18 0851)  . feeding supplement (VITAL AF 1.2 CAL) 1,000 mL (07/07/18 1635)   . B-complex with vitamin C  1 tablet Per Tube QHS  . chlorhexidine gluconate (MEDLINE KIT)  15 mL Mouth Rinse BID  . Chlorhexidine Gluconate Cloth  6 each Topical Q0600  . docusate sodium  100 mg Oral BID  . heparin injection (subcutaneous)  5,000 Units Subcutaneous Q12H  . insulin aspart  0-15 Units Subcutaneous Q4H  . ipratropium-albuterol  3 mL Nebulization Q4H  . mouth rinse  15 mL Mouth Rinse 10 times per day  . pantoprazole sodium  40 mg Per Tube q1800  . senna  1 tablet Oral Daily  . sodium chloride flush  10-40 mL Intracatheter Q12H   sodium chloride, acetaminophen **OR** acetaminophen, alteplase, heparin, ondansetron **OR** ondansetron (ZOFRAN) IV, sodium chloride flush, traZODone  Assessment/ Plan:  [name] is a 48 y.o. black male HIV currently not on treatment, tuberculosis, history of abscess drainage, who was admitted to [facility] on 07/01/2018 for evaluation of altered mental status and unresponsiveness.  Patient admitted with severe right lower lobe pneumonia, acute renal failure, and respiratory failure. On CRRT from 6/10-6/12. Then intermittent hemodialysis on 6/13.   1.  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  Hemodialysis treatment for today. Orders prepared.   2.  Septic shock/hypotension.    Patient remains off of pressors. Cephazolin ordered.   3.  Acute respiratory failure secondary to right lower lobe pneumonia.  Repeat quantiferon gold   LOS: 7 Joshua Dougherty 6/16/20209:28 AM

## 2018-07-08 NOTE — Progress Notes (Signed)
Pt is requesting that he does not want to continue with any treatment measures (meds, IVF, HD, blood draws, etc.). Pt is requesting that he wants to discharge to a nursing facility with hospice. Psychiatry saw and evaluated pt, and he is deemed competent to make own his medical decisions.   Therefore will d/c all med and treatment orders and place orders for comfort care until arrangements are made tomorrow for discharge with hospice. Orders placed for prn Morphine if pt were to need.   Darel Hong, AGACNP-BC [facility] Pager: [phone] Cell: [phone]

## 2018-07-08 NOTE — Progress Notes (Signed)
ID Pt has been extubated Awake , says he does not want anything, no checking, no intervention, no labs-  Discussed with his nurse Psychiatrist consult may be hepful

## 2018-07-08 NOTE — Progress Notes (Signed)
Pt verbalized that he wanted to die, consulted MD, Pt stated he no longer wanted to continue treatment. MD arrived to clarify with pt who decided not to continue with tratment.

## 2018-07-08 NOTE — Consult Note (Signed)
[facility] Consult   Reason for Consult:  Refusing treatment, evaluation for capacity Referring Physician: Dr. 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  Patient is seen, chart is reviewed, collaborative care with ICU intensivist. Total Time spent with patient: 45 minutes  Subjective: "I am ready to die."  HPI:   Joshua Dougherty is a 48 y.o. male patient with a known history of HIV and TB, who presented to the emergency room with a concern of acute respiratory distress with associated cough and dyspnea without wheezing.  He denied any fever or chills.  No nausea vomiting or abdominal pain.  No chest pain or palpitations.  No dysuria, oliguria or hematuria or flank pain.Per EMS, patient's family called the sheriff's department because they had not heard from him in several days. Fire department found patient confused, lying supine on the ground with difficulty breathing. He was placed on nonrebreather and transferred to the ER. Upon arrival here his heart rate was 141 with a respiratory 52 and blood pressure one 104/73.  His pulse was 65 pulse extremity was 95% on nonrebreather.  He was then placed on BiPAP.  His venous blood gas showed a bicarbonate of 14.7 and his CMP was remarkable for anion gap of 17 and AST of 83.  His COVID-19 test came back negative.  Chest x-ray showed large right lower lobe pneumonia and possibly small right parapneumonic effusion with small airspace disease in the left base. He had blood cultures drawn and was given IV cefepime, vancomycin and clindamycin.  He will be admitted to a stepdown unit for further evaluation and management.  Psychiatry consult is requested for assessment of  patient's capacity for medical decision making.  On evaluation, patient is calm and cooperative.  Patient is alert and oriented x4.  He describes that he has an ex-wife and a 22 year old daughter.  He has a close relationship with his mother, but does not want to worry her and request that we not contact her at this time.  He states that his family is aware of his wishes.  He clearly states he does not want to continue medical treatment.  He states he wishes to be DO NOT RESUSCITATE, no CPR, no ventilator, no tube feedings, no dialysis, no medications.  Patient states that without treatment he knows that he will die.  Patient reports that he believes in [name], and feels that he is ready to go.  Patient is agreeable to palliative care consult as well as speaking with the chaplain to create a living will and power of attorney if needed.  Patient is requesting discharge to a nursing home with hospice in place.  He states he would be willing to take medications from hospice in order to keep him comfortable.  Past Psychiatric History: None  Risk to Self:  Denies.  Patient denies any active suicidal ideation, plan or intent.  He reports that should he be discharged from the hospital, he would not take any measures to complete a suicide.  He reports that he expects that he will die peacefully with hospice. Risk to Others:  Denies Prior Inpatient Therapy:  None Prior Outpatient Therapy:  None  Past Medical History:  Past Medical History:  Diagnosis Date  . HIV (human immunodeficiency virus infection) (  [place])   . Tuberculosis     Past Surgical History:  Procedure Laterality Date  . ABSCESS DRAINAGE  2013   chest wall; patient states it was not contiguous with his lung however this was an operation under general anesthesia  . FLEXIBLE BRONCHOSCOPY Right 03/28/2018   Procedure: FLEXIBLE BRONCHOSCOPY, RIGHT;  Surgeon: Laverle Hobby, MD;  Location: [facility] ORS;  Service: Pulmonary;  Laterality: Right;    Family History: History reviewed. No pertinent family history.   Family Psychiatric  History: none  Social History:  Social History   Substance and Sexual Activity  Alcohol Use Yes  . Alcohol/week: 2.0 - 3.0 standard drinks  . Types: 2 - 3 Standard drinks or equivalent per week  . Frequency: Never     Social History   Substance and Sexual Activity  Drug Use No    Social History   Socioeconomic History  . Marital status: Single    Spouse name: Not on file  . Number of children: Not on file  . Years of education: Not on file  . Highest education level: Not on file  Occupational History  . Not on file  Social Needs  . Financial resource strain: Not on file  . Food insecurity    Worry: Not on file    Inability: Not on file  . Transportation needs    Medical: Not on file    Non-medical: Not on file  Tobacco Use  . Smoking status: Current Every Day Smoker    Packs/day: 0.25  . Smokeless tobacco: Current User  Substance and Sexual Activity  . Alcohol use: Yes    Alcohol/week: 2.0 - 3.0 standard drinks    Types: 2 - 3 Standard drinks or equivalent per week    Frequency: Never  . Drug use: No  . Sexual activity: Not on file  Lifestyle  . Physical activity    Days per week: Not on file    Minutes per session: Not on file  . Stress: Not on file  Relationships  . Social Herbalist on phone: Not on file    Gets together: Not on file    Attends religious service: Not on file    Active member of club or organization: Not on file    Attends meetings of clubs or organizations: Not on file    Relationship status: Not on file  Other Topics Concern  . Not on file  Social History Narrative  . Not on file   Additional Social History:    Has an ex-wife and a daughter.  Patient states he lives alone.  He reports that he last worked in 2017.  Allergies:  No Known Allergies  Labs:  Results for orders placed or performed during the hospital encounter of  07/01/18 (from the past 48 hour(s))  Glucose, capillary     Status: Abnormal   Collection Time: 07/06/18  7:35 PM  Result Value Ref Range   Glucose-Capillary 157 (H) 70 - 99 mg/dL   Comment 1 Document in Chart   Glucose, capillary     Status: Abnormal   Collection Time: 07/07/18 12:13 AM  Result Value Ref Range   Glucose-Capillary 131 (H) 70 - 99 mg/dL   Comment 1 Document in Chart   CBC     Status: Abnormal   Collection Time: 07/07/18  4:03 AM  Result Value Ref Range   WBC 11.2 (H) 4.0 - 10.5 K/uL   RBC 3.00 (L) 4.22 - 5.81 MIL/uL  Hemoglobin 10.0 (L) 13.0 - 17.0 g/dL   HCT 29.0 (L) 39.0 - 52.0 %   MCV 96.7 80.0 - 100.0 fL   MCH 33.3 26.0 - 34.0 pg   MCHC 34.5 30.0 - 36.0 g/dL   RDW 15.9 (H) 11.5 - 15.5 %   Platelets 104 (L) 150 - 400 K/uL    Comment: Immature Platelet Fraction may be clinically indicated, consider ordering this additional test [id number]    nRBC 0.3 (H) 0.0 - 0.2 %    Comment: Performed at [facility], [address] [place], [place] [address]  Comprehensive metabolic panel     Status: Abnormal   Collection Time: 07/07/18  4:03 AM  Result Value Ref Range   Sodium 136 135 - 145 mmol/L   Potassium 5.0 3.5 - 5.1 mmol/L   Chloride 102 98 - 111 mmol/L   CO2 26 22 - 32 mmol/L   Glucose, Bld 150 (H) 70 - 99 mg/dL   BUN 68 (H) 6 - 20 mg/dL   Creatinine, Ser 3.02 (H) 0.61 - 1.24 mg/dL   Calcium 7.6 (L) 8.9 - 10.3 mg/dL   Total Protein 5.4 (L) 6.5 - 8.1 g/dL   Albumin 1.6 (L) 3.5 - 5.0 g/dL   AST 56 (H) 15 - 41 U/L   ALT 10 0 - 44 U/L   Alkaline Phosphatase 105 38 - 126 U/L   Total Bilirubin 1.9 (H) 0.3 - 1.2 mg/dL   GFR calc non Af Amer 23 (L) >60 mL/min   GFR calc Af Amer 27 (L) >60 mL/min   Anion gap 8 5 - 15    Comment: Performed at [facility], [address], [place], [place] [address]  Magnesium     Status: None   Collection Time: 07/07/18  4:03 AM  Result Value Ref Range   Magnesium 2.2 1.7 - 2.4 mg/dL    Comment: Performed  at [facility], [address] [place], [place] [address]  Phosphorus     Status: Abnormal   Collection Time: 07/07/18  4:03 AM  Result Value Ref Range   Phosphorus 5.0 (H) 2.5 - 4.6 mg/dL    Comment: Performed at [facility], [address] [place], [place] [address]  Lactic acid, plasma     Status: None   Collection Time: 07/07/18  4:03 AM  Result Value Ref Range   Lactic Acid, Venous 1.1 0.5 - 1.9 mmol/L    Comment: Performed at [facility], [place], [place] [address]  Procalcitonin - Baseline     Status: None   Collection Time: 07/07/18  4:03 AM  Result Value Ref Range   Procalcitonin 21.32 ng/mL    Comment:        Interpretation: PCT >= 10 ng/mL: Important systemic inflammatory response, almost exclusively due to severe bacterial sepsis or septic shock. (NOTE)       Sepsis PCT Algorithm           Lower Respiratory Tract                                      Infection PCT Algorithm    ----------------------------     ----------------------------         PCT < 0.25 ng/mL                PCT < 0.10 ng/mL  Strongly encourage             Strongly discourage   discontinuation of antibiotics    initiation of antibiotics    ----------------------------     -----------------------------       PCT 0.25 - 0.50 ng/mL            PCT 0.10 - 0.25 ng/mL               OR       >80% decrease in PCT            Discourage initiation of                                            antibiotics      Encourage discontinuation           of antibiotics    ----------------------------     -----------------------------         PCT >= 0.50 ng/mL              PCT 0.26 - 0.50 ng/mL                AND       <80% decrease in PCT             Encourage initiation of                                             antibiotics       Encourage continuation           of antibiotics    ----------------------------     -----------------------------        PCT  >= 0.50 ng/mL                  PCT > 0.50 ng/mL               AND         increase in PCT                  Strongly encourage                                      initiation of antibiotics    Strongly encourage escalation           of antibiotics                                     -----------------------------                                           PCT <= 0.25 ng/mL                                                 OR                                        >  80% decrease in PCT                                     Discontinue / Do not initiate                                             antibiotics Performed at [facility], [address] [place], [place] [address]   Glucose, capillary     Status: Abnormal   Collection Time: 07/07/18  4:18 AM  Result Value Ref Range   Glucose-Capillary 127 (H) 70 - 99 mg/dL   Comment 1 Document in Chart   Glucose, capillary     Status: Abnormal   Collection Time: 07/07/18  7:34 AM  Result Value Ref Range   Glucose-Capillary 138 (H) 70 - 99 mg/dL  Glucose, capillary     Status: Abnormal   Collection Time: 07/07/18 11:50 AM  Result Value Ref Range   Glucose-Capillary 148 (H) 70 - 99 mg/dL  Glucose, capillary     Status: Abnormal   Collection Time: 07/07/18  4:28 PM  Result Value Ref Range   Glucose-Capillary 144 (H) 70 - 99 mg/dL  Glucose, capillary     Status: Abnormal   Collection Time: 07/07/18  7:48 PM  Result Value Ref Range   Glucose-Capillary 138 (H) 70 - 99 mg/dL  Glucose, capillary     Status: Abnormal   Collection Time: 07/07/18 11:33 PM  Result Value Ref Range   Glucose-Capillary 130 (H) 70 - 99 mg/dL  Glucose, capillary     Status: Abnormal   Collection Time: 07/08/18 12:40 AM  Result Value Ref Range   Glucose-Capillary 131 (H) 70 - 99 mg/dL  Glucose, capillary     Status: Abnormal   Collection Time: 07/08/18  3:35 AM  Result Value Ref Range   Glucose-Capillary 120 (H) 70 - 99 mg/dL  Procalcitonin     Status:  None   Collection Time: 07/08/18  6:00 AM  Result Value Ref Range   Procalcitonin 13.37 ng/mL    Comment:        Interpretation: PCT >= 10 ng/mL: Important systemic inflammatory response, almost exclusively due to severe bacterial sepsis or septic shock. (NOTE)       Sepsis PCT Algorithm           Lower Respiratory Tract                                      Infection PCT Algorithm    ----------------------------     ----------------------------         PCT < 0.25 ng/mL                PCT < 0.10 ng/mL         Strongly encourage             Strongly discourage   discontinuation of antibiotics    initiation of antibiotics    ----------------------------     -----------------------------       PCT 0.25 - 0.50 ng/mL            PCT 0.10 - 0.25 ng/mL  OR       >80% decrease in PCT            Discourage initiation of                                            antibiotics      Encourage discontinuation           of antibiotics    ----------------------------     -----------------------------         PCT >= 0.50 ng/mL              PCT 0.26 - 0.50 ng/mL                AND       <80% decrease in PCT             Encourage initiation of                                             antibiotics       Encourage continuation           of antibiotics    ----------------------------     -----------------------------        PCT >= 0.50 ng/mL                  PCT > 0.50 ng/mL               AND         increase in PCT                  Strongly encourage                                      initiation of antibiotics    Strongly encourage escalation           of antibiotics                                     -----------------------------                                           PCT <= 0.25 ng/mL                                                 OR                                        > 80% decrease in PCT                                     Discontinue / Do not initiate  antibiotics Performed at [facility], [address] [place], [place] [address]   CBC with Differential/Platelet     Status: Abnormal   Collection Time: 07/08/18  6:00 AM  Result Value Ref Range   WBC 6.8 4.0 - 10.5 K/uL   RBC 3.17 (L) 4.22 - 5.81 MIL/uL   Hemoglobin 10.5 (L) 13.0 - 17.0 g/dL   HCT 30.7 (L) 39.0 - 52.0 %   MCV 96.8 80.0 - 100.0 fL   MCH 33.1 26.0 - 34.0 pg   MCHC 34.2 30.0 - 36.0 g/dL   RDW 15.8 (H) 11.5 - 15.5 %   Platelets 97 (L) 150 - 400 K/uL    Comment: Immature Platelet Fraction may be clinically indicated, consider ordering this additional test [id number]    nRBC 0.3 (H) 0.0 - 0.2 %   Neutrophils Relative % 78 %   Neutro Abs 5.2 1.7 - 7.7 K/uL   Lymphocytes Relative 11 %   Lymphs Abs 0.7 0.7 - 4.0 K/uL   Monocytes Relative 8 %   Monocytes Absolute 0.6 0.1 - 1.0 K/uL   Eosinophils Relative 0 %   Eosinophils Absolute 0.0 0.0 - 0.5 K/uL   Basophils Relative 0 %   Basophils Absolute 0.0 0.0 - 0.1 K/uL   Immature Granulocytes 3 %   Abs Immature Granulocytes 0.23 (H) 0.00 - 0.07 K/uL    Comment: Performed at [facility], [address] [place], [place] [address]  Basic metabolic panel     Status: Abnormal   Collection Time: 07/08/18  6:00 AM  Result Value Ref Range   Sodium 136 135 - 145 mmol/L   Potassium 4.3 3.5 - 5.1 mmol/L   Chloride 102 98 - 111 mmol/L   CO2 24 22 - 32 mmol/L   Glucose, Bld 142 (H) 70 - 99 mg/dL   BUN 96 (H) 6 - 20 mg/dL   Creatinine, Ser 3.94 (H) 0.61 - 1.24 mg/dL   Calcium 7.4 (L) 8.9 - 10.3 mg/dL   GFR calc non Af Amer 17 (L) >60 mL/min   GFR calc Af Amer 20 (L) >60 mL/min   Anion gap 10 5 - 15    Comment: Performed at [facility], [address] [place], [place] [address]  Glucose, capillary     Status: Abnormal   Collection Time: 07/08/18  7:54 AM  Result Value Ref Range   Glucose-Capillary 137 (H) 70 - 99 mg/dL  Glucose, capillary     Status: Abnormal    Collection Time: 07/08/18 12:04 PM  Result Value Ref Range   Glucose-Capillary 119 (H) 70 - 99 mg/dL    Current Facility-Administered Medications  Medication Dose Route Frequency Provider Last Rate Last Dose  . 0.9 %  sodium chloride infusion   Intravenous PRN Tyler Pita, MD   Stopped at 07/04/18 1446  . acetaminophen (TYLENOL) tablet 650 mg  650 mg Oral Q6H PRN Mansy, Jan A, MD   650 mg at 07/01/18 1524   Or  . acetaminophen (TYLENOL) suppository 650 mg  650 mg Rectal Q6H PRN Mansy, Jan A, MD      . alteplase (CATHFLO ACTIVASE) injection 2 mg  2 mg Intracatheter Once PRN Holley Raring, Munsoor, MD      . B-complex with vitamin C tablet 1 tablet  1 tablet Per Tube QHS Flora Lipps, MD   1 tablet at 07/03/18 2124  . ceFAZolin (ANCEF) IVPB 1 g/50 mL premix  1 g Intravenous Q24H Charlett Nose, [facility]  Stopped at 07/07/18 1832  . chlorhexidine gluconate (MEDLINE KIT) (PERIDEX) 0.12 % solution 15 mL  15 mL Mouth Rinse BID Tyler Pita, MD   15 mL at 07/08/18 0749  . Chlorhexidine Gluconate Cloth 2 % PADS 6 each  6 each Topical Q0600 Holley Raring, Munsoor, MD   6 each at 07/08/18 0630  . dexmedetomidine (PRECEDEX) 400 MCG/100ML (4 mcg/mL) infusion  0.4-1.2 mcg/kg/hr Intravenous Titrated Awilda Bill, NP   Stopped at 07/08/18 1515  . docusate sodium (COLACE) capsule 100 mg  100 mg Oral BID Dallie Piles, [facility]      . feeding supplement (VITAL AF 1.2 CAL) liquid 1,000 mL  1,000 mL Per Tube Continuous Tyler Pita, MD 60 mL/hr at 07/07/18 1635 1,000 mL at 07/07/18 1635  . heparin injection 1,000 Units  1,000 Units Dialysis PRN Lateef, Munsoor, MD      . heparin injection 5,000 Units  5,000 Units Subcutaneous Q12H Tyler Pita, MD   Stopped at 07/08/18 1052  . insulin aspart (novoLOG) injection 0-15 Units  0-15 Units Subcutaneous Q4H Darel Hong D, NP   2 Units at 07/08/18 [phone]  . ipratropium-albuterol (DUONEB) 0.5-2.5 (3) MG/3ML nebulizer solution 3 mL  3 mL Nebulization Q4H PRN  Flora Lipps, MD      . MEDLINE mouth rinse  15 mL Mouth Rinse 10 times per day Tyler Pita, MD   15 mL at 07/08/18 1659  . ondansetron (ZOFRAN) tablet 4 mg  4 mg Oral Q6H PRN Mansy, Jan A, MD       Or  . ondansetron [facility]) injection 4 mg  4 mg Intravenous Q6H PRN Mansy, Jan A, MD      . pantoprazole sodium (PROTONIX) 40 mg/20 mL oral suspension 40 mg  40 mg Per Tube q1800 Charlett Nose, RPH   40 mg at 07/07/18 1647  . senna (SENOKOT) tablet 8.6 mg  1 tablet Oral Daily Dallie Piles, [facility]   Stopped at 07/08/18 1052  . sodium chloride flush (NS) 0.9 % injection 10-40 mL  10-40 mL Intracatheter Q12H Tukov-Yual, Magdalene S, NP   20 mL at 07/08/18 1002  . sodium chloride flush (NS) 0.9 % injection 10-40 mL  10-40 mL Intracatheter PRN Tukov-Yual, Magdalene S, NP      . traZODone (DESYREL) tablet 25 mg  25 mg Oral QHS PRN Mansy, Arvella Merles, MD        Musculoskeletal: Strength & Muscle Tone: within normal limits Gait & Station: normal Patient leans: N/A  Psychiatric Specialty Exam: Physical Exam  ROS  Blood pressure (!) 147/87, pulse 76, temperature 97.6 F (36.4 C), temperature source Oral, resp. rate (!) 22, height 6' 0.01" (1.829 m), weight 70.8 kg, SpO2 97 %.Body mass index is 21.16 kg/m.  General Appearance: Fairly Groomed and Neat  Eye Contact:  Good  Speech:  Garbled and with clear  Volume:  Decreased  Mood:  Euthymic  Affect:  Congruent  Thought Process:  Goal Directed, Linear and Descriptions of Associations: Intact  Orientation:  Full (Time, Place, and Person)  Thought Content:  Logical and Hallucinations: None  Suicidal Thoughts:  No  Homicidal Thoughts:  No  Memory:  good  Judgement:  Fair  Insight:  Good  Psychomotor Activity:  Normal  Concentration:  Concentration: Good and Attention Span: Good  Recall:  Good  Fund of Knowledge:  Good  Language:  Good  Akathisia:  No  Handed:  Right  AIMS (if indicated):  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  Request Palliative care and Chaplain consult to complete Advanced Directive and POA.   Disposition: No evidence of imminent risk to self or others at present.   Patient does not meet criteria for psychiatric inpatient admission. Supportive therapy provided about ongoing stressors. Patient desires to be discharged to SNF with hospice.  Lavella Hammock, MD 07/08/2018 5:27 PM

## 2018-07-08 NOTE — Progress Notes (Signed)
Post HD Tx    07/08/18 1230  Vital Signs  Temp 97.6 F (36.4 C)  Temp Source Oral  Pulse Rate 76  Pulse Rate Source Monitor  Resp (!) 22  BP (!) 147/87  BP Location Left Arm  BP Method Automatic  Patient Position (if appropriate) Lying  Oxygen Therapy  SpO2 97 %  O2 Device Room Air  Pain Assessment  Pain Scale 0-10  Pain Score 0  Post-Hemodialysis Assessment  Rinseback Volume (mL) 250 mL  KECN 30.9 V  Dialyzer Clearance Lightly streaked  Duration of HD Treatment -hour(s) 1.5 hour(s)  Hemodialysis Intake (mL) 500 mL  UF Total -Machine (mL) 598 mL  Net UF (mL) 98 mL  Tolerated HD Treatment Yes  Hemodialysis Catheter Left Femoral vein Triple lumen Temporary  Placement Date/Time: 07/02/18 1633   Placed prior to admission: No  Person Inserting Catheter: Hinton Dyer, NP  Orientation: Left  Access Location: Femoral vein  Hemodialysis Catheter Type: Triple lumen Temporary  Site Condition No complications  Post treatment catheter status Capped and Clamped

## 2018-07-08 NOTE — Progress Notes (Signed)
Pt refusing all treatment and assessments. He is insisting that he wants to die and has asked RN and nurse tech to leave room. Has had a urinary incontinence episode but is refusing assistance to clean himself. NP is aware and is going to discuss comfort care measures with pt.

## 2018-07-08 NOTE — Progress Notes (Signed)
[facility] [place] NAME: Joshua Dougherty    MR#:  [id number]  DATE OF BIRTH:  1970-07-09  SUBJECTIVE:  CHIEF COMPLAINT:   Chief Complaint  Patient presents with  . Altered Mental Status  . Respiratory Distress   No new complaints reported by nursing staff.  Patient extubated this morning.  REVIEW OF SYSTEMS:  ROS Unobtainable since patient not fully awake at this time.  Extubated this morning  DRUG ALLERGIES:  No Known Allergies VITALS:  Blood pressure (!) 147/87, pulse 76, temperature 97.6 F (36.4 C), temperature source Oral, resp. rate (!) 22, height 6' 0.01" (1.829 m), weight 70.8 kg, SpO2 97 %. PHYSICAL EXAMINATION:  Physical Exam  GENERAL:  48 y.o.-year-old patient lying in the bed critically ill.   EYES: Pupils equal, round, reactive to light . No scleral icterus.   HEENT: Head atraumatic, normocephalic.  Orally intubated.   NECK:  Supple, no jugular venous distention. No thyroid enlargement, no tenderness.  LUNGS: Coarse breath sounds bilaterally.Marland Kitchen  CARDIOVASCULAR: S1, S2 normal. No murmurs, rubs, or gallops.  ABDOMEN: Soft, nontender, nondistended. Bowel sounds present. No organomegaly or mass.  EXTREMITIES: No pedal edema, cyanosis, or clubbing.  NEUROLOGIC: Extubated this morning.  Still somewhat drowsy.  Full neuro exam not done at this time. PSYCHIATRIC sedated SKIN: No obvious rash, lesion, or ulcer.   LABORATORY PANEL:  Male CBC Recent Labs  Lab 07/08/18 0600  WBC 6.8  HGB 10.5*  HCT 30.7*  PLT 97*   ------------------------------------------------------------------------------------------------------------------ Chemistries  Recent Labs  Lab 07/07/18 0403 07/08/18 0600  NA 136 136  K 5.0 4.3  CL 102 102  CO2 26 24  GLUCOSE 150* 142*  BUN 68* 96*  CREATININE 3.02* 3.94*  CALCIUM 7.6* 7.4*  MG 2.2  --   AST 56*  --   ALT 10  --   ALKPHOS 105  --   BILITOT 1.9*  --    RADIOLOGY:  No results  found. ASSESSMENT AND PLAN:   48 year old male with history of AIDS, latent TB admitted for acute respiratory failure and septic shock  Acute respiratory failure due to septic shock, requiring intubation.  Patient was extubated on 07/08/2018  #1 .septic shock secondary to bilateral pneumonia, patient has polymicrobial bacteremia with staph aureus, E. coli, GBS source likely lung.,    Patient currently on Ancef.  Continue vasopressors repeat blood cultures no growth.  Leukocytosis gradually improving.  No fever. Infectious disease specialist recommended TEE.  Patient documented to be refusing further procedures at this time.  2.  Acute respiratory failure; secondary to right lower lobe pneumonia Patient extubated on 07/08/2018. Marland Kitchen  History of cavitary pneumonia, admitted in February that time post sputum AFB that  were sent, which were negative, patient also had bronc following discharge which were negative for AFB.  3 .  Acute renal failure with ATN secondary to septic shock, ; renal function not improved yet.,  Patient was initially placed on CRRT by nephrology.  This was discontinued previously and patient started on on hemodialysis on 07/05/2018.   Renal ultrasound consistent with HIV-associated nephropathy.  Will need continued hemodialysis. We will dialysis tomorrow.  #4. HIV, recent CD4 count 403, not on Elnoria Howard as not engaged in the care as per ID documentation. Prognosis guarded, high risk for cardiac arrest.   Infectious disease specialist following.  DVT prophylaxis; heparin  All the records are reviewed and case discussed with Care Management/Social Worker. Management plans discussed with  the patient, family; wife recently and they are in agreement.  CODE STATUS: DNR  TOTAL TIME TAKING CARE OF THIS PATIENT: 36 minutes.   More than 50% of the time was spent in counseling/coordination of care: YES  POSSIBLE D/C IN 2 DAYS, DEPENDING ON CLINICAL CONDITION.   Joshua Dougherty  M.D on 07/08/2018 at 4:48 PM  Between 7am to 6pm - Pager - [phone]  After 6pm go to [email or web address] - Proofreader  [facility]  Office  [phone]  CC: Primary care physician; Patient, No Pcp Per  Note: This dictation was prepared with Dragon dictation along with smaller phrase technology. Any transcriptional errors that result from this process are unintentional.

## 2018-07-08 NOTE — Progress Notes (Signed)
   07/08/18 1810  Clinical Encounter Type  Visited With Patient  Visit Type Spiritual support  Referral From Nurse  Consult/Referral To Chaplain  Spiritual Encounters  Spiritual Needs Emotional  Patient was sleeping upon arrival. Woke up to verbal stimuli. Patient's head turned to right side throughout most of visit. Joshua Dougherty sat on patient's right side and introduced self. Patient shared feelings with this author. CH provided empathic responses. Pt seemed to get agitated throughout visit and asked [place] to leave. Shared details of visit with pt's nurse. RN stated that patient has kicked everyone who visits out of room. No further visits are necessary unless requested.

## 2018-07-08 NOTE — Progress Notes (Addendum)
Follow up - Critical Care Medicine Note  Patient Details:    Joshua Dougherty is an 48 y.o. male.with a PMH of Tuberculosis (dx 02/2018 underwent bronchoscopy for sampling on 03/28/2018 by Dr. Ashby Dawes) and HIV (currently not on treatment).  He presented to [facility] ER on 06/9 via EMS with severe respiratory distress.  Intubated and mechanically ventilated.  Noted to have severe sepsis and septic shock, with severe consolidation of the right lower lobe and polymicrobial bacteremia (staph aureus, E. Coli, GBS and Enterobacter).  Admitted to ICU for management.  Lines, Airways, Drains: Airway 7.5 mm (Active)  Secured at (cm) 23 cm 07/02/2018  8:55 PM  Measured From Lips 07/02/2018  8:55 PM  Secured Location Left 07/02/2018  8:55 PM  Secured By [employer] 07/02/2018  8:55 PM  Tube Holder Repositioned Yes 07/02/2018  8:55 PM  Cuff Pressure (cm H2O) 24 cm H2O 07/02/2018  8:55 PM  Site Condition Cool;Dry 07/02/2018  8:55 PM     CVC Triple Lumen 07/01/18 Right Femoral (Active)  Indication for Insertion or Continuance of Line Vasoactive infusions;Poor Vasculature-patient has had multiple peripheral attempts or PIVs lasting less than 24 hours 07/02/2018  8:00 PM  Site Assessment Clean;Dry;Intact 07/02/2018  8:00 PM  Proximal Lumen Status Infusing;Flushed;Blood return noted 07/02/2018  8:00 PM  Medial Lumen Status Infusing;Flushed;Blood return noted 07/02/2018  8:00 PM  Distal Lumen Status Flushed;Blood return noted;In-line blood sampling system in place 07/02/2018  8:00 PM  Dressing Type Transparent;Occlusive;Securing device 07/02/2018  8:00 PM  Dressing Status Intact;Dry;Antimicrobial disc in place;Clean 07/02/2018  8:00 PM  [place] pulled back;Connections checked and tightened 07/02/2018  8:00 PM  Dressing Change Due 07/07/18 07/02/2018  8:00 AM     NG/OG Tube Orogastric 16 Fr. Right mouth Xray (Active)  Cm Marking at Nare/Corner of Mouth (if applicable) 72 cm 7/34/2876  8:00 PM  Site Assessment  Clean;Dry;Intact 07/02/2018  8:00 PM  Ongoing Placement Verification No change in cm markings or external length of tube from initial placement;No change in respiratory status;No acute changes, not attributed to clinical condition 07/02/2018  8:00 PM  Status Suction-low intermittent 07/02/2018  8:00 PM  Amount of suction 80 mmHg 07/02/2018  8:00 PM  Drainage Appearance Green 07/02/2018  8:00 PM  Intake (mL) 0 mL 07/02/2018  8:00 AM  Output (mL) 50 mL 07/02/2018  8:00 PM     Urethral Catheter Megan RN  Non-latex 14 Fr. (Active)  Indication for Insertion or Continuance of Catheter Unstable critically ill patients first 24-48 hours (See Criteria) 07/02/2018  8:00 PM  Site Assessment Clean;Intact 07/02/2018  8:00 PM  Catheter Maintenance Bag below level of bladder;Catheter secured;Drainage bag/tubing not touching floor;Insertion date on drainage bag;Seal intact;No dependent loops 07/02/2018  8:00 PM  Collection Container Standard drainage bag 07/02/2018  8:00 PM  Securement Method Securing device (Describe) 07/02/2018  8:00 PM  Urinary Catheter Interventions Unclamped 07/02/2018  8:00 PM  Input (mL) 0 mL 07/02/2018  8:00 AM  Output (mL) 0 mL 07/02/2018  6:00 PM    Anti-infectives:  Anti-infectives (From admission, onward)   Start     Dose/Rate Route Frequency Ordered Stop   07/07/18 1800  ceFAZolin (ANCEF) IVPB 1 g/50 mL premix     1 g 100 mL/hr over 30 Minutes Intravenous Every 24 hours 07/07/18 1603     07/03/18 1600  ceFAZolin (ANCEF) IVPB 1 g/50 mL premix  Status:  Discontinued     1 g 100 mL/hr over 30 Minutes Intravenous Every 8  hours 07/03/18 1458 07/07/18 1603   07/02/18 2200  meropenem (MERREM) 1 g in sodium chloride 0.9 % 100 mL IVPB  Status:  Discontinued     1 g 200 mL/hr over 30 Minutes Intravenous Every 8 hours 07/02/18 1546 07/03/18 1458   07/01/18 2200  ceFEPIme (MAXIPIME) 2 g in sodium chloride 0.9 % 100 mL IVPB  Status:  Discontinued     2 g 200 mL/hr over 30 Minutes Intravenous Every  24 hours 07/01/18 0727 07/01/18 1432   07/01/18 1600  meropenem (MERREM) 500 mg in sodium chloride 0.9 % 100 mL IVPB  Status:  Discontinued     500 mg 200 mL/hr over 30 Minutes Intravenous Every 12 hours 07/01/18 1432 07/02/18 1546   07/01/18 1000  azithromycin (ZITHROMAX) 500 mg in sodium chloride 0.9 % 250 mL IVPB  Status:  Discontinued     500 mg 250 mL/hr over 60 Minutes Intravenous Every 24 hours 07/01/18 0425 07/02/18 1151   07/01/18 1000  vancomycin (VANCOCIN) 500 mg in sodium chloride 0.9 % 100 mL IVPB     500 mg 100 mL/hr over 60 Minutes Intravenous  Once 07/01/18 0728 07/02/18 0812   07/01/18 0600  ceFEPIme (MAXIPIME) 1 g in sodium chloride 0.9 % 100 mL IVPB  Status:  Discontinued     1 g 200 mL/hr over 30 Minutes Intravenous Every 8 hours 07/01/18 0425 07/01/18 0727   07/01/18 0200  clindamycin (CLEOCIN) IVPB 600 mg     600 mg 100 mL/hr over 30 Minutes Intravenous  Once 07/01/18 0145 07/01/18 0443   07/01/18 0145  vancomycin (VANCOCIN) IVPB 1000 mg/200 mL premix     1,000 mg 200 mL/hr over 60 Minutes Intravenous  Once 07/01/18 0131 07/01/18 0443   07/01/18 0145  ceFEPIme (MAXIPIME) 1 g in sodium chloride 0.9 % 100 mL IVPB     1 g 200 mL/hr over 30 Minutes Intravenous  Once 07/01/18 0131 07/01/18 0401      Microbiology: Results for orders placed or performed during the hospital encounter of 07/01/18  SARS Coronavirus 2 (CEPHEID- Performed in [place] hospital lab), Hosp Order     Status: None   Collection Time: 07/01/18  1:19 AM   Specimen: Nasopharyngeal Swab  Result Value Ref Range Status   SARS Coronavirus 2 NEGATIVE NEGATIVE Final    Comment: (NOTE) If result is NEGATIVE SARS-CoV-2 target nucleic acids are NOT DETECTED. The SARS-CoV-2 RNA is generally detectable in upper and lower  respiratory specimens during the acute phase of infection. The lowest  concentration of SARS-CoV-2 viral copies this assay can detect is 250  copies / mL. A negative result does not  preclude SARS-CoV-2 infection  and should not be used as the sole basis for treatment or other  patient management decisions.  A negative result may occur with  improper specimen collection / handling, submission of specimen other  than nasopharyngeal swab, presence of viral mutation(s) within the  areas targeted by this assay, and inadequate number of viral copies  (<250 copies / mL). A negative result must be combined with clinical  observations, patient history, and epidemiological information. If result is POSITIVE SARS-CoV-2 target nucleic acids are DETECTED. The SARS-CoV-2 RNA is generally detectable in upper and lower  respiratory specimens dur ing the acute phase of infection.  Positive  results are indicative of active infection with SARS-CoV-2.  Clinical  correlation with patient history and other diagnostic information is  necessary to determine patient infection status.  Positive results  do  not rule out bacterial infection or co-infection with other viruses. If result is PRESUMPTIVE POSTIVE SARS-CoV-2 nucleic acids MAY BE PRESENT.   A presumptive positive result was obtained on the submitted specimen  and confirmed on repeat testing.  While 2019 novel coronavirus  (SARS-CoV-2) nucleic acids may be present in the submitted sample  additional confirmatory testing may be necessary for epidemiological  and / or clinical management purposes  to differentiate between  SARS-CoV-2 and other Sarbecovirus currently known to infect humans.  If clinically indicated additional testing with an alternate test  methodology [phone]) is advised. The SARS-CoV-2 RNA is generally  detectable in upper and lower respiratory sp ecimens during the acute  phase of infection. The expected result is Negative. Fact Sheet for Patients:  StrictlyIdeas.no Fact Sheet for Healthcare Providers: BankingDealers.co.za This test is not yet approved or cleared by  the [place] FDA and has been authorized for detection and/or diagnosis of SARS-CoV-2 by FDA under an Emergency Use Authorization (EUA).  This EUA will remain in effect (meaning this test can be used) for the duration of the COVID-19 declaration under Section 564(b)(1) of the Act, 21 U.S.C. section 360bbb-3(b)(1), unless the authorization is terminated or revoked sooner. Performed at [facility], [address] [place], [place] [address]   Blood culture (routine x 2)     Status: Abnormal   Collection Time: 07/01/18  1:19 AM   Specimen: BLOOD  Result Value Ref Range Status   Specimen Description   Final    BLOOD LEFT ANTECUBITAL Performed at [facility], [place] [address], [place], [place] [address]    Special Requests   Final    BOTTLES DRAWN AEROBIC AND ANAEROBIC Blood Culture results may not be optimal due to an excessive volume of blood received in culture bottles Performed at [facility], [address] [place], [place] [address]    Culture  Setup Time   Final    GRAM NEGATIVE RODS IN BOTH AEROBIC AND ANAEROBIC BOTTLES CRITICAL RESULT CALLED TO, READ BACK BY AND VERIFIED WITH: CHRISTINE KATSOUDAS AT 5643 07/01/18 SG Performed at [facility], [address] [place], [place] [address]    Culture ESCHERICHIA COLI (A)  Final   Report Status 07/03/2018 FINAL  Final   Organism ID, Bacteria ESCHERICHIA COLI  Final      Susceptibility   Escherichia coli - MIC*    AMPICILLIN 4 SENSITIVE Sensitive     CEFAZOLIN <=4 SENSITIVE Sensitive     CEFEPIME <=1 SENSITIVE Sensitive     CEFTAZIDIME <=1 SENSITIVE Sensitive     CEFTRIAXONE <=1 SENSITIVE Sensitive     CIPROFLOXACIN <=0.25 SENSITIVE Sensitive     GENTAMICIN <=1 SENSITIVE Sensitive     IMIPENEM <=0.25 SENSITIVE Sensitive     TRIMETH/SULFA <=20 SENSITIVE Sensitive     AMPICILLIN/SULBACTAM <=2 SENSITIVE Sensitive     PIP/TAZO <=4 SENSITIVE Sensitive     Extended ESBL NEGATIVE Sensitive      * ESCHERICHIA COLI  Blood culture (routine x 2)     Status: Abnormal   Collection Time: 07/01/18  3:09 AM   Specimen: BLOOD  Result Value Ref Range Status   Specimen Description   Final    BLOOD LEFT ARM Performed at [facility], [address], [place], [place] [address]    Special Requests   Final    BOTTLES DRAWN AEROBIC AND ANAEROBIC Blood Culture adequate volume Performed at [facility], [address],  [place], [place] [address]    Culture  Setup Time   Final    GRAM POSITIVE COCCI IN BOTH AEROBIC AND ANAEROBIC BOTTLES GRAM NEGATIVE RODS IN BOTH AEROBIC AND ANAEROBIC BOTTLES CRITICAL RESULT CALLED TO, READ BACK BY AND VERIFIED WITH: CHRISTINE KATSOUDAS AT 5397 07/01/18 SG Performed at [facility], [place] [address] [place],  [address]    Culture (A)  Final    ESCHERICHIA COLI ORGANISM 1 SUSCEPTIBILITIES PERFORMED ON PREVIOUS CULTURE WITHIN THE LAST 5 DAYS. STAPHYLOCOCCUS AUREUS VIRIDANS STREPTOCOCCUS THE SIGNIFICANCE OF ISOLATING THIS ORGANISM FROM A SINGLE SET OF BLOOD CULTURES WHEN MULTIPLE SETS ARE DRAWN IS UNCERTAIN. PLEASE NOTIFY THE MICROBIOLOGY DEPARTMENT WITHIN ONE WEEK IF SPECIATION AND SENSITIVITIES ARE REQUIRED. GROUP B STREP(S.AGALACTIAE)ISOLATED    Report Status 07/04/2018 FINAL  Final   Organism ID, Bacteria STAPHYLOCOCCUS AUREUS  Final   Organism ID, Bacteria GROUP B STREP(S.AGALACTIAE)ISOLATED  Final      Susceptibility   Group b strep(s.agalactiae)isolated - MIC*    CLINDAMYCIN >=1 RESISTANT Resistant     AMPICILLIN <=0.25 SENSITIVE Sensitive     ERYTHROMYCIN >=8 RESISTANT Resistant     VANCOMYCIN 0.5 SENSITIVE Sensitive     CEFTRIAXONE <=0.12 SENSITIVE Sensitive     LEVOFLOXACIN 1 SENSITIVE Sensitive     * GROUP B STREP(S.AGALACTIAE)ISOLATED   Staphylococcus aureus - MIC*    CIPROFLOXACIN 1 SENSITIVE Sensitive     ERYTHROMYCIN >=8 RESISTANT Resistant     GENTAMICIN <=0.5 SENSITIVE Sensitive     OXACILLIN 0.5  SENSITIVE Sensitive     TETRACYCLINE <=1 SENSITIVE Sensitive     VANCOMYCIN 1 SENSITIVE Sensitive     TRIMETH/SULFA <=10 SENSITIVE Sensitive     CLINDAMYCIN RESISTANT Resistant     RIFAMPIN <=0.5 SENSITIVE Sensitive     Inducible Clindamycin POSITIVE Resistant     * STAPHYLOCOCCUS AUREUS  Blood Culture ID Panel (Reflexed)     Status: Abnormal   Collection Time: 07/01/18  3:09 AM  Result Value Ref Range Status   Enterococcus species NOT DETECTED NOT DETECTED Final   Listeria monocytogenes NOT DETECTED NOT DETECTED Final   Staphylococcus species DETECTED (A) NOT DETECTED Final    Comment: CRITICAL RESULT CALLED TO, READ BACK BY AND VERIFIED WITH: CHRISTINE KATSOUDAS AT 1325 07/01/18 SG    Staphylococcus aureus (BCID) DETECTED (A) NOT DETECTED Final    Comment: Methicillin (oxacillin) susceptible Staphylococcus aureus (MSSA). Preferred therapy is anti staphylococcal beta lactam antibiotic (Cefazolin or Nafcillin), unless clinically contraindicated. CRITICAL RESULT CALLED TO, READ BACK BY AND VERIFIED WITH: CHRISTINE KATSOUDAS AT 9379 07/01/18 SG    Methicillin resistance NOT DETECTED NOT DETECTED Final   Streptococcus species DETECTED (A) NOT DETECTED Final    Comment: CRITICAL RESULT CALLED TO, READ BACK BY AND VERIFIED WITH: CHRISTINE KATSOUDAS AT 1325 07/01/18 SG    Streptococcus agalactiae DETECTED (A) NOT DETECTED Final    Comment: CRITICAL RESULT CALLED TO, READ BACK BY AND VERIFIED WITH: CHRISTINE KATSOUDAS AT 0240 07/01/18 SG    Streptococcus pneumoniae NOT DETECTED NOT DETECTED Final   Streptococcus pyogenes NOT DETECTED NOT DETECTED Final   Acinetobacter baumannii NOT DETECTED NOT DETECTED Final   Enterobacteriaceae species DETECTED (A) NOT DETECTED Final    Comment: Enterobacteriaceae represent a large family of gram-negative bacteria, not a single organism. CRITICAL RESULT CALLED TO, READ BACK BY AND VERIFIED WITH: CHRISTINE KATSOUDAS AT 9735 07/01/18 SG    Enterobacter cloacae  complex NOT DETECTED NOT DETECTED Final   Escherichia coli DETECTED (A) NOT DETECTED Final  Comment: CRITICAL RESULT CALLED TO, READ BACK BY AND VERIFIED WITH: CHRISTINE KATSOUDAS AT 1751 07/01/18 SG    Klebsiella oxytoca NOT DETECTED NOT DETECTED Final   Klebsiella pneumoniae NOT DETECTED NOT DETECTED Final   Proteus species NOT DETECTED NOT DETECTED Final   Serratia marcescens NOT DETECTED NOT DETECTED Final   Carbapenem resistance NOT DETECTED NOT DETECTED Final   Haemophilus influenzae NOT DETECTED NOT DETECTED Final   Neisseria meningitidis NOT DETECTED NOT DETECTED Final   Pseudomonas aeruginosa NOT DETECTED NOT DETECTED Final   Candida albicans NOT DETECTED NOT DETECTED Final   Candida glabrata NOT DETECTED NOT DETECTED Final   Candida krusei NOT DETECTED NOT DETECTED Final   Candida parapsilosis NOT DETECTED NOT DETECTED Final   Candida tropicalis NOT DETECTED NOT DETECTED Final    Comment: Performed at [facility], [address] [place], [place] [address]  MRSA PCR Screening     Status: None   Collection Time: 07/01/18  9:53 AM   Specimen: Nasopharyngeal  Result Value Ref Range Status   MRSA by PCR NEGATIVE NEGATIVE Final    Comment:        The GeneXpert MRSA Assay (FDA approved for NASAL specimens only), is one component of a comprehensive MRSA colonization surveillance program. It is not intended to diagnose MRSA infection nor to guide or monitor treatment for MRSA infections. Performed at [facility], [address] [place], [place] [address]   Culture, respiratory (non-expectorated)     Status: None   Collection Time: 07/02/18  6:48 PM   Specimen: Tracheal Aspirate; Respiratory  Result Value Ref Range Status   Specimen Description   Final    TRACHEAL ASPIRATE Performed at [facility], [address] [place], [place] [address]    Special Requests   Final    NONE Performed at [facility], [address]  [place], [place] [address]    Gram Stain   Final    RARE WBC PRESENT, PREDOMINANTLY PMN NO ORGANISMS SEEN    Culture   Final    RARE Consistent with normal respiratory flora. Performed at [facility], [place] [address], [place], [place] [address]    Report Status 07/05/2018 FINAL  Final  Culture, blood (routine x 2)     Status: None   Collection Time: 07/03/18 12:51 AM   Specimen: BLOOD  Result Value Ref Range Status   Specimen Description BLOOD RIGHT HAND  Final   Special Requests   Final    BOTTLES DRAWN AEROBIC AND ANAEROBIC Blood Culture adequate volume   Culture   Final    NO GROWTH 5 DAYS Performed at [facility], [address], [place],  [address]    Report Status 07/08/2018 FINAL  Final  Culture, blood (routine x 2)     Status: None   Collection Time: 07/03/18 12:58 AM   Specimen: BLOOD  Result Value Ref Range Status   Specimen Description BLOOD LEFT HAND  Final   Special Requests   Final    BOTTLES DRAWN AEROBIC AND ANAEROBIC Blood Culture adequate volume   Culture   Final    NO GROWTH 5 DAYS Performed at [facility], [address], [place],  [address]    Report Status 07/08/2018 FINAL  Final    Best Practice/Protocols:  VTE Prophylaxis: Mechanical GI Prophylaxis: Proton Pump Inhibitor Continous Sedation  Events: 6/9 admitted for severe resp failure/SHock, E. 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 EXAM: ABDOMEN - 1 VIEW  COMPARISON:  07/01/2018 abdomen radiograph. 07/06/2018 chest radiograph. FINDINGS: Normal bowel gas pattern. Bones are unremarkable. Nasogastric tube projects over the gastric body with proximal side hole at the gastroesophageal junction. Right lung base opacity again noted. IMPRESSION: Nasogastric tube projects over the gastric body with proximal side hole at the gastroesophageal junction. Electronically Signed   By: Kristine Garbe M.D.   On: 07/06/2018 22:31   Ct Head Wo Contrast  Result Date: 07/07/2018 CLINICAL DATA:  Altered mental status. Respiratory distress. History of HIV and tuberculosis. EXAM: CT HEAD WITHOUT CONTRAST TECHNIQUE: Contiguous axial images were obtained from the base of the skull through the vertex without intravenous contrast. COMPARISON:  None. FINDINGS: Brain: No evidence for acute infarction, hemorrhage, mass lesion, hydrocephalus, or extra-axial fluid. Mild atrophy, premature for age. Hypoattenuation of white matter, likely small vessel disease. Old RIGHT frontal infarct. Vascular: No hyperdense vessel or unexpected calcification. Skull: Normal. Negative for fracture or focal lesion. Sinuses/Orbits: No acute finding. Other: None. IMPRESSION: Chronic changes as described. No acute intracranial findings. Electronically Signed   By: Staci Righter M.D.   On: 07/07/2018 12:10   Ct Chest Wo Contrast  Result Date: 07/04/2018 CLINICAL DATA:  48 year old male with a past medical history of tuberculosis and HIV. The patient currently has severe respiratory distress and findings concerning for right lower lobe pneumonia. EXAM: CT CHEST WITHOUT CONTRAST TECHNIQUE: Multidetector CT imaging of the chest was performed following the standard protocol without IV contrast. COMPARISON:  CT chest dated 03/18/2018 FINDINGS: Cardiovascular: The heart is enlarged. There are mild coronary artery calcifications. There is no significant pericardial effusion. Mediastinum/Nodes: There are mildly  prominent mediastinal and hilar lymph nodes. There are some prominent but subcentimeter axillary and supraclavicular lymph nodes. The thyroid gland is unremarkable. The esophagus is unremarkable. Lungs/Pleura: Emphysematous changes are noted bilaterally. Endotracheal tube terminates above the carina. There is debris within the right mainstem bronchus. There is a large area of consolidation involving virtually all of the right lower lobe. There are additional scattered areas of consolidation involving all remaining lobes. There is no pneumothorax. There is no significant pleural effusion. The previously demonstrated cavitary area within the right upper lobe has improved. Upper Abdomen: No acute abnormality. Musculoskeletal: No chest wall mass or suspicious bone lesions identified. IMPRESSION: 1. There is consolidation involving nearly all of the right lower lobe. There are additional smaller areas of consolidation involving all remaining lobes. Findings are consistent with multifocal pneumonia. 2. Interval decrease in size of the previously demonstrated cavitary lesion in the right upper lobe. 3. No pneumothorax or significant pleural effusion. 4. Mild cardiomegaly. 5. Emphysema. Emphysema (ICD10-J43.9). Electronically Signed   By: Constance Holster M.D.   On: 07/04/2018 19:21   US Renal  Result Date: 07/06/2018 CLINICAL DATA:  Acute renal failure EXAM: RENAL / URINARY TRACT ULTRASOUND COMPLETE COMPARISON:  None. FINDINGS: Right Kidney: Renal measurements: 11.2 x 5.6 x 4.7 cm = volume: 154 mL. Increased echogenicity compatible with medical renal disease. No hydronephrosis or focal abnormality. Left Kidney:  Renal measurements: 11.2 x 5.2 x 6.1 cm = volume: 187 mL. Similar increased renal echogenicity compatible with medical renal disease. No focal abnormality or hydronephrosis. Bladder: Appears normal for degree of bladder distention. Small amount of diffuse abdominopelvic ascites noted. IMPRESSION: Increased  renal echogenicity bilaterally compatible with ongoing medical renal disease. No hydronephrosis. Small amount of abdominopelvic ascites Electronically Signed   By: Jerilynn Mages.  Shick M.D.   On: 07/06/2018 14:17   Dg Chest Port 1 View  Result Date: 07/07/2018 CLINICAL DATA:  Respiratory distress. EXAM: PORTABLE CHEST 1 VIEW COMPARISON:  07/06/2018. FINDINGS: Endotracheal tube stable position. NG tube noted with tip below left hemidiaphragm. Heart size stable. Right mid lung and right lower lung infiltrates again noted with slight improvement in aeration. Continued follow-up exams suggested to demonstrate clearing and to exclude underlying mass. Small right pleural effusion again noted. No pneumothorax. Thoracic spine scoliosis. IMPRESSION: 1. Endotracheal tube stable position. NG tube noted with tip below left hemidiaphragm. 2. Right mid lung and right lower lung infiltrates again noted with slight improvement in aeration. Continued follow-up exam suggested to demonstrate clearing and to exclude underlying mass. Electronically Signed   By: Marcello Moores  Register   On: 07/07/2018 06:16   Dg Chest Port 1 View  Result Date: 07/06/2018 CLINICAL DATA:  Respiratory failure. Altered mental status. EXAM: PORTABLE CHEST 1 VIEW COMPARISON:  Chest radiograph 07/03/2018 and CT 07/04/2018 FINDINGS: Endotracheal tube terminates at the clavicular heads. Enteric tube has been removed since the prior radiographs were obtained. The cardiomediastinal silhouette is unchanged. There is persistent consolidation throughout the right lower lobe. They are is also patchy right upper lobe and retrocardiac left lower lobe opacity as seen on CT. No pneumothorax is identified. IMPRESSION: Unchanged pneumonia with extensive right lower lobe involvement. Electronically Signed   By: Logan Bores M.D.   On: 07/06/2018 07:37   Dg Chest Port 1 View  Result Date: 07/03/2018 CLINICAL DATA:  Respiratory failure EXAM: PORTABLE CHEST 1 VIEW COMPARISON:   Portable exam 0434 hours compared to 07/01/2018 FINDINGS: Nasogastric tube extends into stomach. Tip of endotracheal tube projects 6.1 cm above carina. External pacing leads in EKG leads project over chest. Normal heart size, mediastinal contours, and pulmonary vascularity. Persistent consolidation RIGHT lower lobe. Remaining lungs grossly clear. No definite pleural effusion or pneumothorax. IMPRESSION: Persistent consolidation RIGHT lower lobe. Electronically Signed   By: Lavonia Dana M.D.   On: 07/03/2018 08:07   Dg Chest Portable 1 View  Result Date: 07/01/2018 CLINICAL DATA:  Hypoxia EXAM: PORTABLE CHEST 1 VIEW COMPARISON:  July 01, 2018 study obtained earlier in the day FINDINGS: Note that a portion of the left base is not apparent on either this examination or the abdominal image obtained at this time. Endotracheal tube tip is 5.7 cm above the carina. Nasogastric tube tip and side port extend below the diaphragm. No pneumothorax. Visualized left lung is clear. There is extensive airspace consolidation throughout much of the right lung including essentially all of the right middle and lower lobes as well as a portion of the right upper lobe. There is a right pleural effusion, stable. Heart size and pulmonary vascularity are normal. No adenopathy. No bone lesions. IMPRESSION: Tube positions as described without pneumothorax. Extensive airspace consolidation on the right with right pleural effusion, stable. Visualized left lung clear. Stable cardiac silhouette. Electronically Signed   By: Lowella Grip III M.D.   On: 07/01/2018 08:35   Dg Chest Portable 1 View  Result Date: 07/01/2018 CLINICAL DATA:  Shortness of breath EXAM: PORTABLE CHEST 1 VIEW COMPARISON:  04/16/2018 FINDINGS: There is a large airspace opacity involving much of the right lower lobe. There is likely a small parapneumonic effusion. There is a density projecting over the upper trachea which is felt to represent the patient's  non-rebreather mass as opposed to a very high endotracheal tube. Involving the left lower lobe, however this is not well evaluated secondary to multiple cardiac leads. There is no definite pneumothorax. There is a linear density coursing across the patient's right mid chest which is felt to represent artifact as opposed to a pneumothorax. There is no acute osseous abnormality. IMPRESSION: 1. Large right lower lobe airspace opacity concerning for pneumonia. 2. Probable small right-sided parapneumonic effusion. 3. Additional small airspace opacity at the left lung base. Attention on follow-up examinations is recommended. Electronically Signed   By: Constance Holster M.D.   On: 07/01/2018 01:51   Dg Abd Portable 1 View  Result Date: 07/01/2018 CLINICAL DATA:  Orogastric tube placement EXAM: PORTABLE ABDOMEN - 1 VIEW COMPARISON:  None. FINDINGS: Orogastric tube tip and side port are in the stomach. There is no bowel dilatation or air-fluid level to suggest bowel obstruction. No free air. There is a right pleural effusion with extensive consolidation throughout the visualized right lung. Visualized left lung clear. IMPRESSION: Orogastric tube tip and side port in stomach. No bowel obstruction or free air demonstrable. Extensive airspace consolidation with pleural effusion on the right. Electronically Signed   By: Lowella Grip III M.D.   On: 07/01/2018 08:36    Consults: Treatment Team:  Tsosie Billing, MD Anthonette Legato, MD   Subjective:    Overnight Issues: No overnight issues.  Awakens easily today.  Off of all sedatives except Precedex which is being weaned off.  Follows commands.  Tolerating spontaneous breathing trial.  Objective:  Vital signs for last 24 hours: Temp:  [96.1 F (35.6 C)-97.7 F (36.5 C)] 97.6 F (36.4 C) (06/16 1230) Pulse Rate:  [51-86] 76 (06/16 1230) Resp:  [12-25] 22 (06/16 1230) BP: (97-148)/(67-94) 147/87 (06/16 1230) SpO2:  [96 %-100 %] 97 % (06/16  1230) FiO2 (%):  [30 %-40 %] 40 % (06/16 0733)  Hemodynamic parameters for last 24 hours:    Intake/Output from previous day: 06/15 0701 - 06/16 0700 In: 946.1 [I.V.:896.1; IV Piggyback:50] Out: -   Intake/Output this shift: Total I/O In: -  Out: 98 [Other:98]  Vent settings for last 24 hours: Vent Mode: Spontaneous FiO2 (%):  [30 %-40 %] 40 % Set Rate:  [18 bmp] 18 bmp Vt Set:  [500 mL] 500 mL PEEP:  [5 cmH20] 5 cmH20 Pressure Support:  [5 cmH20] 5 cmH20  Physical Exam:  General: Thin chronically ill-appearing male, awakens easily, intubated mechanically ventilated. Neuro:  Awakens easily, follows commands. HEENT: Bilateral temporal wasting, anicteric sclera.  Conjunctiva pale. Cardiovascular: Tachycardic, regular rate, no rubs murmurs gallops heard. Lungs: Coarse breath sounds throughout.    Increased variation on the right base Abdomen: Scaphoid, soft, nondistended, normoactive bowel sounds.  No tenderness Musculoskeletal: Decreased muscle mass throughout.  No deformities noted. Skin: Warm, dry, no rashes noted.  Assessment/Plan:  Acute hypoxic respiratory failure secondary to RLL pneumonia and metabolic acidosis  Acidosis has corrected Tolerating spontaneous breathing trials and is meeting criteria for extubation. Continue bronchodilators for mucociliary clearance Continue cefazolin as directed by ID Ventilator management: Extubate today  Metabolic acidosis-lactic acidosis This has corrected, monitor  Acute renal failure Due to multiorgan failure from severe sepsis Oliguric  but did make urine today Dialysis today, patient actually requested that dialysis be terminated Element of ATN Supportive care avoid nephrotoxins Nephrology following, appreciate input  HIV positive/immune compromised state This issue adds complexity to his management Infectious disease following Ideally patient should have TEE however he is refusing further procedures.  Severe sepsis  with septic shock He has corrected this issue.  Constrictive pericarditis with constrictive physiology Patient should have evaluation by cardiology as an outpatient  Toxic metabolic encephalopathy: This issue has cleared. Continue to wean off Precedex.  Prophylaxis: subcu heparin for DVT and PPI for GI.     LOS: 7 days   Additional comments:Multidisciplinary rounds were performed with ICU team  Critical Care Total Time*: 35 Minutes  C. Derrill Kay, MD [facility]  07/08/2018  *Care during the described time interval was provided by me and/or other providers on the critical care team.  I have reviewed this patient's available data, including medical history, events of note, physical examination and test results as part of my evaluation.

## 2018-07-08 NOTE — Progress Notes (Signed)
HD Tx started w/o complication    37/09/64 1045  Vital Signs  Resp (!) 22  BP 134/87  BP Location Left Arm  BP Method Automatic  Patient Position (if appropriate) Lying  Oxygen Therapy  SpO2 98 %  O2 Device Nasal Cannula  Pulse Oximetry Type Continuous  During Hemodialysis Assessment  Blood Flow Rate (mL/min) 400 mL/min  Arterial Pressure (mmHg) -190 mmHg  Venous Pressure (mmHg) 250 mmHg  Transmembrane Pressure (mmHg) 50 mmHg  Ultrafiltration Rate (mL/min) 400 mL/min  Dialysate Flow Rate (mL/min) 600 ml/min  Conductivity: Machine  14  HD Safety Checks Performed Yes  Dialysis Fluid Bolus Normal Saline  Bolus Amount (mL) 250 mL  Intra-Hemodialysis Comments Tx initiated

## 2018-07-08 NOTE — Progress Notes (Signed)
Pre HD Tx   07/08/18 1030  Hand-Off documentation  Report given to (Full Name) Beatris Ship, RN   Vital Signs  Pulse Rate 72  Pulse Rate Source Monitor  Resp 18  BP 138/83  BP Location Left Arm  BP Method Automatic  Patient Position (if appropriate) Lying  Oxygen Therapy  SpO2 98 %  O2 Device Nasal Cannula  Pulse Oximetry Type Continuous  Pain Assessment  Pain Scale 0-10  Pain Score 0  Time-Out for Hemodialysis  What Procedure? HD  Pt Identifiers(min of two) First/Last Name;MRN/Account#  Correct Site? Yes  Correct Side? Yes  Correct Procedure? Yes  Consents Verified? Yes  Rad Studies Available? N/A  Safety Precautions Reviewed? Yes  Engineer, civil (consulting) Number 3  Station Number  (Bedside ICU 20 )  UF/Alarm Test Passed  Conductivity: Meter 14  Conductivity: Machine  14  pH 7.2  Reverse Osmosis Main  Normal Saline Lot Number [id number]  Dialyzer Lot Number [id number]  Disposable Set Lot Number [id number]  Machine Temperature 98.6 F (37 C)  Musician and Audible Yes  Blood Lines Intact and Secured Yes  Pre Treatment Patient Checks  Vascular access used during treatment Catheter  HD catheter dressing before treatment WDL  Hepatitis B Surface Antigen Results Negative  Date Hepatitis B Surface Antigen Drawn 07/05/18  Hepatitis B Surface Antibody  (<10)  Date Hepatitis B Surface Antibody Drawn 07/05/18  Hemodialysis Consent Verified Yes  Hemodialysis Standing Orders Initiated Yes  ECG (Telemetry) Monitor On Yes  Prime Ordered Normal Saline  Length of  DialysisTreatment -hour(s) 3 Hour(s)  Dialysis Treatment Comments Na 140  Dialyzer Elisio 17H NR  Dialysate 3K;2.5 Ca  Dialysis Anticoagulant None  Dialysate Flow Ordered 600  Blood Flow Rate Ordered 400 mL/min  Ultrafiltration Goal 0.5 Liters  Dialysis Blood Pressure Support Ordered Normal Saline  Education / Care Plan  Dialysis Education Provided No (Comment) (Pt is confused )  Documented Education in  Care Plan Yes  Hemodialysis Catheter Left Femoral vein Triple lumen Temporary  Placement Date/Time: 07/02/18 1633   Placed prior to admission: No  Person Inserting Catheter: Hinton Dyer, NP  Orientation: Left  Access Location: Femoral vein  Hemodialysis Catheter Type: Triple lumen Temporary  Site Condition No complications  Blue Lumen Status Blood return noted  Red Lumen Status Blood return noted  Purple Lumen Status Capped (Central line)  Dressing Type Biopatch;Occlusive  Dressing Status Clean;Dry;Intact  Drainage Description None  Dressing Change Due 07/09/18

## 2018-07-08 NOTE — Progress Notes (Signed)
Pre HD assessment    07/08/18 1030  Neurological  Level of Consciousness Responds to Voice  Orientation Level Oriented to person  Respiratory  Respiratory Pattern Regular  Chest Assessment Chest expansion symmetrical  Bilateral Breath Sounds Diminished  Cough None  Cardiac  Pulse Regular  Heart Sounds S1, S2  ECG Monitor Yes  Cardiac Rhythm NSR  Heart Block Type 1st degree AVB  Vascular  R Radial Pulse +2  L Radial Pulse +2  Edema Generalized  Generalized Edema +1  Psychosocial  Psychosocial (WDL) WDL  Patient Behaviors Calm;Cooperative  Emotional support given Given to patient

## 2018-07-09 DIAGNOSIS — Z7189 Other specified counseling: Secondary | ICD-10-CM

## 2018-07-09 DIAGNOSIS — Z515 Encounter for palliative care: Secondary | ICD-10-CM

## 2018-07-09 LAB — HEPATITIS B SURFACE ANTIBODY,QUALITATIVE: Hep B S Ab: REACTIVE

## 2018-07-09 NOTE — Progress Notes (Signed)
Nutrition Brief Follow-Up Note RD working remotely.  Chart reviewed. Patient has transitioned to comfort care.  No further nutrition interventions warranted at this time. Please consult RD as needed.   Willey Blade, MS, [place], LDN Office: [phone] Pager: [phone] After Hours/Weekend Pager: [phone]

## 2018-07-09 NOTE — Consult Note (Addendum)
Consultation Note Date: 07/09/2018   Patient Name: Joshua Dougherty  DOB: July 15, 1970  MRN: [id number]  Age / Sex: 48 y.o., male  PCP: Patient, No Pcp Per Referring Physician: Otila Back, MD  Reason for Consultation: Establishing goals of care  HPI/Patient Profile: Joshua Dougherty  is a 48 y.o. African-American male with a known history of HIV and TB, who presented to the emergency room with a concern of acute respiratory distress with associated cough and dyspnea without wheezing.  He denied any fever or chills.  No nausea vomiting or abdominal pain.  No chest pain or palpitations.  No dysuria, oliguria or hematuria or flank pain.Per EMS, patient's family called the sheriff's department because they had not heard from him in several days. Fire department found patient confused, lying supine on the ground with difficulty breathing. He was placed on nonrebreather and transferred to the ER. He was treated for PNA. He was intubated and extubated when medically appropriate.    Clinical Assessment and Goals of Care: Patient is resting in bed. He does not talk above a whisper. He states he has not had a voice in 11 months. He states he has an exwife, a daughter, and a mother.   He states he has been on dialysis for years, he will not state how long. Outside of this, he only states "I'm ready to die." Attempted unsuccessfully to engage him in conversation about functional status, oral intake, and QOL over the past 6 months, as he states " I just want to die." He states "no" when asked if he wanted to focus on comfort with preparation for death 1 month ago. When asked what has changed over the past month, he states " I can't. Everybody must go. It's time to go. I want to die."    He does give me permission to speak to his mother, however no answer at the number in the chart. Attempted to call a second time and his mother answers.  She states "my nerves are shot and your going to have to talk with my daughter." Explained palliative is an extra layer of support and that we do not wish to cause extra stress or burden. She states thank you and hung up.    Patient has been cleared by psychiatry yesterday.  Per notes, diagnosed with HIV in February, but no follow up noted. CD4 count  < 200.     SUMMARY OF RECOMMENDATIONS   Recommend hospice facility if able.    Code Status/Advance Care Planning:  DNR   Prognosis:   Poor. < 2 weeks without dialysis. Stopped abx. CD4 count < 200. Tx for PNA, cavitary lesion noted in March, and TB testing was negative. Per nursing ,he has not tried to get out of bed. He is only drinking sips.   Discharge Planning: D/C with hospice.      Primary Diagnoses: Present on Admission: . CAP (community acquired pneumonia) . 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 The following aspects are pertinent.  Past Medical History:  Diagnosis Date  . HIV (human immunodeficiency virus infection) ([place])   . Tuberculosis    Social History   Socioeconomic History  . Marital status: Single    Spouse name: Not on file  . Number of children: Not on file  . Years of education: Not on file  . Highest education level: Not on file  Occupational History  . Not on file  Social Needs  . Financial resource strain: Not on file  . Food insecurity    Worry: Not on file    Inability: Not on file  . Transportation needs    Medical: Not on file    Non-medical: Not on file  Tobacco Use  . Smoking status: Current Every Day Smoker    Packs/day: 0.25  . Smokeless tobacco: Current User  Substance and Sexual Activity  . Alcohol use: Yes    Alcohol/week: 2.0 - 3.0 standard drinks    Types: 2 - 3 Standard drinks or equivalent per week    Frequency: Never  . Drug use: No  . Sexual activity: Not on file   Lifestyle  . Physical activity    Days per week: Not on file    Minutes per session: Not on file  . Stress: Not on file  Relationships  . Social Herbalist on phone: Not on file    Gets together: Not on file    Attends religious service: Not on file    Active member of club or organization: Not on file    Attends meetings of clubs or organizations: Not on file    Relationship status: Not on file  Other Topics Concern  . Not on file  Social History Narrative  . Not on file   History reviewed. No pertinent family history. Scheduled Meds: . sodium chloride flush  10-40 mL Intracatheter Q12H   Continuous Infusions: . sodium chloride Stopped (07/04/18 1446)   PRN Meds:.sodium chloride, acetaminophen **OR** acetaminophen, ipratropium-albuterol, morphine injection, ondansetron **OR** ondansetron (ZOFRAN) IV, sodium chloride flush, traZODone Medications Prior to Admission:  Prior to Admission medications   Not on File   No Known Allergies Review of Systems  Unable to perform ROS: Other    Physical Exam HENT:     Head:     Comments: Lips words and at times whispers.  Pulmonary:     Effort: Pulmonary effort is normal.  Neurological:     Mental Status: He is alert.     Vital Signs: BP (!) 162/101 (BP Location: Left Arm)   Pulse 79   Temp (!) 97.4 F (36.3 C)   Resp 16   Ht 6\' 4"  (1.93 m)   Wt 71.3 kg   SpO2 100%   BMI 19.12 kg/m  Pain Scale: 0-10 POSS *See Group Information*: 2-Acceptable,Slightly drowsy, easily aroused Pain Score: 0-No pain   SpO2: SpO2: 100 % O2 Device:SpO2: 100 % O2 Flow Rate: .   IO: Intake/output summary:   Intake/Output Summary (Last 24 hours) at 07/09/2018 1052 Last data filed at 07/09/2018 0400 Gross per 24 hour  Intake 137.21 ml  Output 98 ml  Net 39.21 ml    LBM: Last BM Date: (last BM unknown) Baseline Weight: Weight: 67 kg Most recent weight: Weight: 71.3 kg     Palliative Assessment/Data:     Time In: 10:50  Time Out: 11:30 Time Total: 40 min  Greater than 50%  of this time  was spent counseling and coordinating care related to the above assessment and plan.  Signed by: Asencion Gowda, NP   Please contact Palliative Medicine Team phone at [phone] for questions and concerns.  For individual provider: See Shea Evans

## 2018-07-09 NOTE — Plan of Care (Signed)
  Problem: Education: Goal: Knowledge of the prescribed therapeutic regimen will improve Outcome: Progressing   Problem: Coping: Goal: Ability to identify and develop effective coping behavior will improve Outcome: Progressing   Problem: Clinical Measurements: Goal: Quality of life will improve Outcome: Progressing   Problem: Respiratory: Goal: Verbalizations of increased ease of respirations will increase Outcome: Progressing   Problem: Pain Management: Goal: Satisfaction with pain management regimen will improve Outcome: Progressing

## 2018-07-09 NOTE — Progress Notes (Signed)
New referral for [facility] hospice home received from [facility]. Writer attempted to speak with patient as he was alert, but very weak. He gave permission for writer to speak with his sister Aaban Griep Foust who lives in [place], he also gave Probation officer permission to speak with his Mother Audelia Acton who lives in [place] conversation had with Phineas Semen, she relates that she and her ex sister-in-law Darl Householder as well as patient's mother are all aware of his diagnosis and poor prognosis. They are all supportive of patient's previously expressed choices not to continue dialysis or any further life prolonging interventions. Writer initiated education regarding hospice home services, philosophy and visitation guidelines with understanding voiced. Plan is for hospice social worker to contact Carylon Perches and Rada Hay tomorrow morning via conference call.  ]Consents to be sighed using [employer]. Patient to transfer to the hospice home on 6/18 after consents are completed. CMRN Jeanna updated. Patient in formation faxed to referral. Will continue to follow through discharge. Thank you. Flo Shanks BSN, RN, [place] [phone]

## 2018-07-09 NOTE — Progress Notes (Signed)
[place] [facility] [place] NAME: Lorry Anastasi    MR#:  [id number]  DATE OF BIRTH:  Aug 06, 1970  SUBJECTIVE:  CHIEF COMPLAINT:   Chief Complaint  Patient presents with  . Altered Mental Status  . Respiratory Distress   Patient was extubated yesterday.  Patient has made decision to be placed on comfort care with plans for discharge to nursing home with hospice.  Palliative care will be consulted.  Patient seen by psychiatrist yesterday as well.  Patient does not want any further treatment of blood draws.  REVIEW OF SYSTEMS:  Review of Systems  Constitutional: Negative for chills and fever.  HENT: Negative for hearing loss and tinnitus.   Eyes: Negative for blurred vision and double vision.  Respiratory: Negative for cough and shortness of breath.   Cardiovascular: Negative for chest pain and palpitations.  Gastrointestinal: Negative for abdominal pain, heartburn, nausea and vomiting.  Genitourinary: Negative for dysuria and urgency.  Musculoskeletal: Negative for back pain and myalgias.  Skin: Negative for itching and rash.  Neurological: Negative for dizziness and headaches.  Psychiatric/Behavioral: Negative for depression and hallucinations.     DRUG ALLERGIES:  No Known Allergies VITALS:  Blood pressure (!) 162/101, pulse 79, temperature (!) 97.4 F (36.3 C), resp. rate 16, height 6\' 4"  (1.93 m), weight 71.3 kg, SpO2 100 %. PHYSICAL EXAMINATION:  Physical Exam  GENERAL:  48 y.o.-year-old patient lying in the bed critically ill.   EYES: Pupils equal, round, reactive to light . No scleral icterus.   HEENT: Head atraumatic, normocephalic.  Orally intubated.   NECK:  Supple, no jugular venous distention. No thyroid enlargement, no tenderness.  LUNGS: Coarse breath sounds bilaterally.Marland Kitchen  CARDIOVASCULAR: S1, S2 normal. No murmurs, rubs, or gallops.  ABDOMEN: Soft, nontender, nondistended. Bowel sounds present. No organomegaly or mass.   EXTREMITIES: No pedal edema, cyanosis, or clubbing.  NEUROLOGIC: Extubated this morning.  Still somewhat drowsy.  Full neuro exam not done at this time. PSYCHIATRIC sedated SKIN: No obvious rash, lesion, or ulcer.   LABORATORY PANEL:  Male CBC Recent Labs  Lab 07/08/18 0600  WBC 6.8  HGB 10.5*  HCT 30.7*  PLT 97*   ------------------------------------------------------------------------------------------------------------------ Chemistries  Recent Labs  Lab 07/07/18 0403 07/08/18 0600  NA 136 136  K 5.0 4.3  CL 102 102  CO2 26 24  GLUCOSE 150* 142*  BUN 68* 96*  CREATININE 3.02* 3.94*  CALCIUM 7.6* 7.4*  MG 2.2  --   AST 56*  --   ALT 10  --   ALKPHOS 105  --   BILITOT 1.9*  --    RADIOLOGY:  No results found. ASSESSMENT AND PLAN:   48 year old male with history of AIDS, latent TB admitted for acute respiratory failure and septic shock  Acute respiratory failure due to septic shock, requiring intubation.  Patient was extubated on 07/08/2018  #1 .septic shock secondary to bilateral pneumonia, patient has polymicrobial bacteremia with staph aureus, E. coli, GBS source likely lung.,     Patient was initially on antibiotics with Ancef.  Previously required pressors.  Infectious disease specialist recently recommended TEE.   Patient however has decided against any further treatment or procedures or labs going forward.  Has been evaluated by psychiatrist.  Started on comfort care measures in accordance with patient's wishes.   Palliative care team already consulted with ultimate plan of discharge to nursing facility with hospice.    2.  Acute respiratory failure; secondary to right  lower lobe pneumonia Patient extubated on 07/08/2018.  Recently treated with antibiotics.  Plans for comfort measures going forward  History of cavitary pneumonia, admitted in February that time post sputum AFB that  were sent, which were negative, patient also had bronc following  discharge which were negative for AFB.  3 .  Acute renal failure with ATN secondary to septic shock, ; renal function not improved yet.,  Patient was initially placed on CRRT by nephrology.  This was discontinued previously and patient started on on hemodialysis on 07/05/2018.   Renal ultrasound consistent with HIV-associated nephropathy.  No further hemodialysis going forward since patient wants to be on comfort care measures only going forward.  4. HIV, recent CD4 count 403, not on Leon as not engaged in the care as per ID documentation. Prognosis guarded, high risk for cardiac arrest.   Infectious disease specialist following.  DVT prophylaxis; heparin noted to have been discontinued since patient is currently on comfort care measures only in accordance with patient's wishes.  All the records are reviewed and case discussed with Care Management/Social Worker. Management plans discussed with the patient, family; wife recently and they are in agreement.  CODE STATUS: DNR  TOTAL TIME TAKING CARE OF THIS PATIENT: 35 minutes.   More than 50% of the time was spent in counseling/coordination of care: YES  POSSIBLE D/C IN 2 DAYS, DEPENDING ON CLINICAL CONDITION.   Hanh Kertesz M.D on 07/09/2018 at 9:53 AM  Between 7am to 6pm - Pager - [phone]  After 6pm go to [email or web address] - Proofreader  [facility]  Office  [phone]  CC: Primary care physician; Patient, No Pcp Per  Note: This dictation was prepared with Dragon dictation along with smaller phrase technology. Any transcriptional errors that result from this process are unintentional.

## 2018-07-10 LAB — QUANTIFERON-TB GOLD PLUS (RQFGPL)
QuantiFERON Mitogen Value: 0.04 IU/mL
QuantiFERON Nil Value: 0.02 IU/mL
QuantiFERON TB1 Ag Value: 0.02 IU/mL
QuantiFERON TB2 Ag Value: 0.02 IU/mL

## 2018-07-10 LAB — QUANTIFERON-TB GOLD PLUS: QuantiFERON-TB Gold Plus: UNDETERMINED — AB

## 2018-07-10 NOTE — Progress Notes (Signed)
EMS here at this time for transport, pt with no complaints

## 2018-07-10 NOTE — TOC Transition Note (Signed)
Transition of Care [facility]) - CM/SW Discharge Note   Patient Details  Name: Joshua Dougherty MRN: [id number] Date of Birth: 10/19/1970  Transition of Care [facility]) CM/SW Contact:  Shelbie Hutching, RN Phone Number: 07/10/2018, 10:42 AM   Clinical Narrative:    Patient will discharge to [place] today.  Patient's mother and sister are aware.     Final next level of care: [place] Barriers to Discharge: Barriers Resolved   Patient Goals and CMS Choice Patient states their goals for this hospitalization and ongoing recovery are:: Patient wants to go to the hospice home- reports he is ready to die   Choice offered to / list presented to : Patient  Discharge Placement              Patient chooses bed at: Other - please specify in the comment section below:([facility]) Patient to be transferred to facility by: [facility] Name of family member notified: Patient's mother and Sister Patient and family notified of of transfer: 07/10/18  Discharge Plan and Services                                     Social Determinants of Health (SDOH) Interventions     Readmission Risk Interventions No flowsheet data found.

## 2018-07-10 NOTE — Progress Notes (Signed)
Daily Progress Note   Patient Name: Joshua Dougherty       Date: 07/10/2018 DOB: 11/09/1970  Age: 48 y.o. MRN#: [id number] Attending Physician: Otila Back, MD Primary Care Physician: Patient, No Pcp Per Admit Date: 07/01/2018  Reason for Consultation/Follow-up: Psychosocial/spiritual support  Subjective: Patient is resting in bed with his eyes closed. He opens his eyes upon my entry. He denies pain. He nods that he is being discharged to hospice home today.  Voice is inaudible.   Length of Stay: 9  Current Medications: Scheduled Meds:  . sodium chloride flush  10-40 mL Intracatheter Q12H    Continuous Infusions: . sodium chloride Stopped (07/04/18 1446)    PRN Meds: sodium chloride, acetaminophen **OR** acetaminophen, ipratropium-albuterol, morphine injection, ondansetron **OR** ondansetron (ZOFRAN) IV, sodium chloride flush, traZODone  Physical Exam Pulmonary:     Effort: Pulmonary effort is normal.  Neurological:     Mental Status: He is alert.             Vital Signs: BP 130/89 (BP Location: Left Arm)   Pulse (!) 106   Temp 97.7 F (36.5 C) (Oral)   Resp 16   Ht 6\' 4"  (1.93 m)   Wt 71.3 kg   SpO2 99%   BMI 19.12 kg/m  SpO2: SpO2: 99 % O2 Device: O2 Device: Room Air O2 Flow Rate:    Intake/output summary:   Intake/Output Summary (Last 24 hours) at 07/10/2018 1130 Last data filed at 07/10/2018 1112 Gross per 24 hour  Intake -  Output 750 ml  Net -750 ml   LBM: Last BM Date: (last BM unknown) Baseline Weight: Weight: 67 kg Most recent weight: Weight: 71.3 kg       Palliative Assessment/Data:       Patient Active Problem List   Diagnosis Date Noted  . 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 CAP (community acquired pneumonia) 07/01/2018  .  Protein-calorie malnutrition, severe 03/19/2018  . Hemoptysis 03/18/2018  . Tobacco abuse 05/20/2014  . Asymptomatic HIV infection ([place]) 05/20/2014    Palliative Care Assessment & Plan   Recommendations/Plan:  Hospice facility  Code Status:    Code Status Orders  (From admission, onward)         Start     Ordered   07/01/18 1635  Do not attempt resuscitation (DNR)  Continuous    Question Answer Comment  In the event of cardiac or respiratory ARREST Do not call a "code blue"   In the event of cardiac or respiratory ARREST Do not perform Intubation, CPR, defibrillation or ACLS   In the event of cardiac or respiratory ARREST Use medication by any route, position, wound care, and other measures to relive pain and suffering. May use oxygen, suction and manual treatment of airway obstruction as needed for comfort.      07/01/18 1634        Code Status History    Date Active Date Inactive Code Status Order ID Comments User Context   07/01/2018 0425 07/01/2018 1634 Full Code [id number]  Sidney Ace Arvella Merles, MD ED   03/18/2018 0510 03/21/2018 1525 Full Code [id number]  Harrie Foreman, MD Inpatient   Advance Care Planning Activity       Prognosis:   < 2 weeks  Discharge Planning:  To Be Determined  Care plan was discussed with hospice liason  Thank you for allowing the Palliative Medicine Team to assist in the care of this patient.   Total Time 15 min Prolonged Time Billed  no      Greater than 50%  of this time was spent counseling and coordinating care related to the above assessment and plan.  Asencion Gowda, NP  Please contact Palliative Medicine Team phone at [phone] for questions and concerns.

## 2018-07-10 NOTE — Progress Notes (Signed)
Pt being discharged to hospice home, Joshua Dougherty with hospice has given report and will arrange EMS for transport, pt resting comfortably in bed, denies any needs

## 2018-07-10 NOTE — Discharge Summary (Signed)
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 African-American male with a known history of HIV and TB, who presented to the emergency room with a concern of acute respiratory distress with associated cough and dyspnea without wheezing.  He denied any fever or chills.  No nausea vomiting or abdominal pain.  No chest pain or palpitations.  No dysuria, oliguria or hematuria or flank pain.Per EMS, patient's family called the sheriff's department because they had not heard from him in several days. Fire department found patient confused, lying supine on the ground with difficulty breathing. He was placed on nonrebreather and transferred to the ER.Upon arrival here his heart rate was 141 with a respiratory 52 and blood pressure one 104/73.  His pulse was 65 pulse extremity was 95% on nonrebreather.  He was then placed on BiPAP.  His venous blood gas showed a bicarbonate of 14.7 and his CMP was remarkable for anion gap of 17 and AST of 83.  His COVID-19 test came back negative.  Chest x-ray showed large right lower lobe pneumonia and possibly small right  parapneumonic effusion with small airspace disease in the left base. He had blood cultures drawn and was given IV cefepime, vancomycin and clindamycin.    Patient was admitted to a stepdown unit for further evaluation and management.   Hospital course;  1 .septic shock secondary to bilateral pneumonia, patient has polymicrobial bacteremia with staph aureus, E. coli, GBS source likely lung.,    Patient was initially on antibiotics with Ancef.  Previously required pressors.  Infectious disease specialist recently recommended TEE.  Patient however has decided against any further treatment or procedures or labs going forward.  Has been evaluated by psychiatrist.  Started on comfort care measures in accordance with patient's wishes.  Palliative care team consulted and patient seen by hospice team.  With patient and family has decided on having patient discharged to hospice home today.  Appreciate input from hospice team.   2. Acute respiratory failure; secondary to right lower lobe pneumonia Patient extubated on 07/08/2018.  Recently treated with antibiotics.  Plans for comfort measures going forward at the hospice home  History of cavitary pneumonia, admitted in February that time post sputum AFB that were sent, which were negative, patient also had bronc following discharge which were negative for AFB.  3 . Acute renal failure with ATN secondary to septic shock, ; renal function not improved yet., Patient was initially placed on CRRT by nephrology.  This was discontinued previously and patient started on on hemodialysis on 07/05/2018.   Renal ultrasound consistent with HIV-associated nephropathy.  No further hemodialysis going forward since patient wants to be on comfort care measures only going forward.  4. HIV, recent CD4 count 403, not on [place] as not engaged in the care as per ID documentation. Prognosis guarded, high risk for cardiac arrest. Appreciate input from infectious disease  specialist.  DISCHARGE CONDITIONS:  Stable CONSULTS OBTAINED:  Treatment Team:  Tsosie Billing, MD Anthonette Legato, MD Lavella Hammock, MD DRUG ALLERGIES:  No Known Allergies DISCHARGE MEDICATIONS:   Allergies as of 07/10/2018   No Known Allergies     Medication List    You have not been prescribed any medications.      DISCHARGE INSTRUCTIONS:  Patient with very poor prognosis.  Being discharged to hospice home. DIET:  Mechanical soft diet as tolerated for pleasure. DISCHARGE CONDITION:  Serious ACTIVITY:  Activity as tolerated OXYGEN:  Home Oxygen: No.  Oxygen Delivery: room air DISCHARGE LOCATION:  Hospice home  If you experience worsening of your admission symptoms, develop shortness of breath, life threatening emergency, suicidal or homicidal thoughts you must seek medical attention immediately by calling 911 or calling your MD immediately  if symptoms less severe.  You Must read complete instructions/literature along with all the possible adverse reactions/side effects for all the Medicines you take and that have been prescribed to you. Take any new Medicines after you have completely understood and accpet all the possible adverse reactions/side effects.   Please note  You were cared for by a hospitalist during your hospital stay. If you have any questions about your discharge medications or the care you received while you were in the hospital after you are discharged, you can call the unit and asked to speak with the hospitalist on call if the hospitalist that took care of you is not available. Once you are discharged, your primary care physician will handle any further medical issues. Please note that NO REFILLS for any discharge medications will be authorized once you are discharged, as it is imperative that you return to your primary care physician (or establish a relationship with a primary care physician if you do not have one) for your aftercare needs  so that they can reassess your need for medications and monitor your lab values.    On the day of Discharge:  VITAL SIGNS:  Blood pressure 130/89, pulse (!) 106, temperature 97.7 F (36.5 C), temperature source Oral, resp. rate 16, height 6\' 4"  (1.93 m), weight 71.3 kg, SpO2 99 %. PHYSICAL EXAMINATION:  GENERAL:  48 y.o.-year-old patient lying in the bed with no acute distress.  EYES: Pupils equal, round, reactive to light and accommodation. No scleral icterus. Extraocular muscles intact.  HEENT: Head atraumatic, normocephalic. Oropharynx and nasopharynx clear.  NECK:  Supple, no jugular venous distention. No thyroid enlargement, no tenderness.  LUNGS: Normal breath sounds bilaterally, no wheezing, rales,rhonchi or crepitation. No use of accessory muscles of respiration.  CARDIOVASCULAR: S1, S2 normal. No murmurs, rubs, or gallops.  ABDOMEN: Soft, non-tender, non-distended. Bowel sounds present. No organomegaly or mass.  EXTREMITIES: No pedal edema, cyanosis, or clubbing.  NEUROLOGIC: Cranial nerves II through XII are intact. Muscle strength 5/5 in all extremities. Sensation intact. Gait not checked.  PSYCHIATRIC: The patient is alert and oriented x 3.  SKIN: No obvious rash, lesion, or ulcer.  DATA REVIEW:   CBC Recent Labs  Lab 07/08/18 0600  WBC 6.8  HGB 10.5*  HCT 30.7*  PLT 97*    Chemistries  Recent Labs  Lab 07/07/18 0403 07/08/18 0600  NA 136 136  K 5.0 4.3  CL 102 102  CO2 26 24  GLUCOSE  150* 142*  BUN 68* 96*  CREATININE 3.02* 3.94*  CALCIUM 7.6* 7.4*  MG 2.2  --   AST 56*  --   ALT 10  --   ALKPHOS 105  --   BILITOT 1.9*  --      Microbiology Results  Results for orders placed or performed during the hospital encounter of 07/01/18  SARS Coronavirus 2 (CEPHEID- Performed in [facility] hospital lab), Hosp Order     Status: None   Collection Time: 07/01/18  1:19 AM   Specimen: Nasopharyngeal Swab  Result Value Ref Range Status   SARS Coronavirus 2  NEGATIVE NEGATIVE Final    Comment: (NOTE) If result is NEGATIVE SARS-CoV-2 target nucleic acids are NOT DETECTED. The SARS-CoV-2 RNA is generally detectable in upper and lower  respiratory specimens during the acute phase of infection. The lowest  concentration of SARS-CoV-2 viral copies this assay can detect is 250  copies / mL. A negative result does not preclude SARS-CoV-2 infection  and should not be used as the sole basis for treatment or other  patient management decisions.  A negative result may occur with  improper specimen collection / handling, submission of specimen other  than nasopharyngeal swab, presence of viral mutation(s) within the  areas targeted by this assay, and inadequate number of viral copies  (<250 copies / mL). A negative result must be combined with clinical  observations, patient history, and epidemiological information. If result is POSITIVE SARS-CoV-2 target nucleic acids are DETECTED. The SARS-CoV-2 RNA is generally detectable in upper and lower  respiratory specimens dur ing the acute phase of infection.  Positive  results are indicative of active infection with SARS-CoV-2.  Clinical  correlation with patient history and other diagnostic information is  necessary to determine patient infection status.  Positive results do  not rule out bacterial infection or co-infection with other viruses. If result is PRESUMPTIVE POSTIVE SARS-CoV-2 nucleic acids MAY BE PRESENT.   A presumptive positive result was obtained on the submitted specimen  and confirmed on repeat testing.  While 2019 novel coronavirus  (SARS-CoV-2) nucleic acids may be present in the submitted sample  additional confirmatory testing may be necessary for epidemiological  and / or clinical management purposes  to differentiate between  SARS-CoV-2 and other Sarbecovirus currently known to infect humans.  If clinically indicated additional testing with an alternate test  methodology [id number]  is advised. The SARS-CoV-2 RNA is generally  detectable in upper and lower respiratory sp ecimens during the acute  phase of infection. The expected result is Negative. Fact Sheet for Patients:  StrictlyIdeas.no Fact Sheet for Healthcare Providers: BankingDealers.co.za This test is not yet approved or cleared by the [place] FDA and has been authorized for detection and/or diagnosis of SARS-CoV-2 by FDA under an Emergency Use Authorization (EUA).  This EUA will remain in effect (meaning this test can be used) for the duration of the COVID-19 declaration under Section 564(b)(1) of the Act, 21 U.S.C. section 360bbb-3(b)(1), unless the authorization is terminated or revoked sooner. Performed at [facility], [address] [place], [place] [address]   Blood culture (routine x 2)     Status: Abnormal   Collection Time: 07/01/18  1:19 AM   Specimen: BLOOD  Result Value Ref Range Status   Specimen Description   Final    BLOOD LEFT ANTECUBITAL Performed at [facility], [place] [address] [place], [place] [address]    Special Requests   Final  BOTTLES DRAWN AEROBIC AND ANAEROBIC Blood Culture results may not be optimal due to an excessive volume of blood received in culture bottles Performed at [facility], [address] [place], [place] [address]    Culture  Setup Time   Final    GRAM NEGATIVE RODS IN BOTH AEROBIC AND ANAEROBIC BOTTLES CRITICAL RESULT CALLED TO, READ BACK BY AND VERIFIED WITH: CHRISTINE KATSOUDAS AT 7124 07/01/18 SG Performed at [facility], [address] [place], [place] [address]    Culture ESCHERICHIA COLI (A)  Final   Report Status 07/03/2018 FINAL  Final   Organism ID, Bacteria ESCHERICHIA COLI  Final      Susceptibility   Escherichia coli - MIC*    AMPICILLIN 4 SENSITIVE Sensitive     CEFAZOLIN <=4 SENSITIVE Sensitive     CEFEPIME <=1 SENSITIVE Sensitive      CEFTAZIDIME <=1 SENSITIVE Sensitive     CEFTRIAXONE <=1 SENSITIVE Sensitive     CIPROFLOXACIN <=0.25 SENSITIVE Sensitive     GENTAMICIN <=1 SENSITIVE Sensitive     IMIPENEM <=0.25 SENSITIVE Sensitive     TRIMETH/SULFA <=20 SENSITIVE Sensitive     AMPICILLIN/SULBACTAM <=2 SENSITIVE Sensitive     PIP/TAZO <=4 SENSITIVE Sensitive     Extended ESBL NEGATIVE Sensitive     * ESCHERICHIA COLI  Blood culture (routine x 2)     Status: Abnormal   Collection Time: 07/01/18  3:09 AM   Specimen: BLOOD  Result Value Ref Range Status   Specimen Description   Final    BLOOD LEFT ARM Performed at [facility], [address] [place], [place] [address]    Special Requests   Final    BOTTLES DRAWN AEROBIC AND ANAEROBIC Blood Culture adequate volume Performed at [facility], [place], [place] [address]    Culture  Setup Time   Final    GRAM POSITIVE COCCI IN BOTH AEROBIC AND ANAEROBIC BOTTLES GRAM NEGATIVE RODS IN BOTH AEROBIC AND ANAEROBIC BOTTLES CRITICAL RESULT CALLED TO, READ BACK BY AND VERIFIED WITH: CHRISTINE KATSOUDAS AT 7673 07/01/18 SG Performed at [facility], [place] [address] [place], [place] [address]    Culture (A)  Final    ESCHERICHIA COLI ORGANISM 1 SUSCEPTIBILITIES PERFORMED ON PREVIOUS CULTURE WITHIN THE LAST 5 DAYS. STAPHYLOCOCCUS AUREUS VIRIDANS STREPTOCOCCUS THE SIGNIFICANCE OF ISOLATING THIS ORGANISM FROM A SINGLE SET OF BLOOD CULTURES WHEN MULTIPLE SETS ARE DRAWN IS UNCERTAIN. PLEASE NOTIFY THE MICROBIOLOGY DEPARTMENT WITHIN ONE WEEK IF SPECIATION AND SENSITIVITIES ARE REQUIRED. GROUP B STREP(S.AGALACTIAE)ISOLATED    Report Status 07/04/2018 FINAL  Final   Organism ID, Bacteria STAPHYLOCOCCUS AUREUS  Final   Organism ID, Bacteria GROUP B STREP(S.AGALACTIAE)ISOLATED  Final      Susceptibility   Group b strep(s.agalactiae)isolated - MIC*    CLINDAMYCIN >=1 RESISTANT Resistant     AMPICILLIN <=0.25 SENSITIVE Sensitive      ERYTHROMYCIN >=8 RESISTANT Resistant     VANCOMYCIN 0.5 SENSITIVE Sensitive     CEFTRIAXONE <=0.12 SENSITIVE Sensitive     LEVOFLOXACIN 1 SENSITIVE Sensitive     * GROUP B STREP(S.AGALACTIAE)ISOLATED   Staphylococcus aureus - MIC*    CIPROFLOXACIN 1 SENSITIVE Sensitive     ERYTHROMYCIN >=8 RESISTANT Resistant     GENTAMICIN <=0.5 SENSITIVE Sensitive     OXACILLIN 0.5 SENSITIVE Sensitive     TETRACYCLINE <=1 SENSITIVE Sensitive     VANCOMYCIN 1 SENSITIVE Sensitive     TRIMETH/SULFA <=10 SENSITIVE Sensitive  CLINDAMYCIN RESISTANT Resistant     RIFAMPIN <=0.5 SENSITIVE Sensitive     Inducible Clindamycin POSITIVE Resistant     * STAPHYLOCOCCUS AUREUS  Blood Culture ID Panel (Reflexed)     Status: Abnormal   Collection Time: 07/01/18  3:09 AM  Result Value Ref Range Status   Enterococcus species NOT DETECTED NOT DETECTED Final   Listeria monocytogenes NOT DETECTED NOT DETECTED Final   Staphylococcus species DETECTED (A) NOT DETECTED Final    Comment: CRITICAL RESULT CALLED TO, READ BACK BY AND VERIFIED WITH: CHRISTINE KATSOUDAS AT 3419 07/01/18 SG    Staphylococcus aureus (BCID) DETECTED (A) NOT DETECTED Final    Comment: Methicillin (oxacillin) susceptible Staphylococcus aureus (MSSA). Preferred therapy is anti staphylococcal beta lactam antibiotic (Cefazolin or Nafcillin), unless clinically contraindicated. CRITICAL RESULT CALLED TO, READ BACK BY AND VERIFIED WITH: CHRISTINE KATSOUDAS AT 3790 07/01/18 SG    Methicillin resistance NOT DETECTED NOT DETECTED Final   Streptococcus species DETECTED (A) NOT DETECTED Final    Comment: CRITICAL RESULT CALLED TO, READ BACK BY AND VERIFIED WITH: CHRISTINE KATSOUDAS AT 1325 07/01/18 SG    Streptococcus agalactiae DETECTED (A) NOT DETECTED Final    Comment: CRITICAL RESULT CALLED TO, READ BACK BY AND VERIFIED WITH: CHRISTINE KATSOUDAS AT 2409 07/01/18 SG    Streptococcus pneumoniae NOT DETECTED NOT DETECTED Final   Streptococcus pyogenes NOT  DETECTED NOT DETECTED Final   Acinetobacter baumannii NOT DETECTED NOT DETECTED Final   Enterobacteriaceae species DETECTED (A) NOT DETECTED Final    Comment: Enterobacteriaceae represent a large family of gram-negative bacteria, not a single organism. CRITICAL RESULT CALLED TO, READ BACK BY AND VERIFIED WITH: CHRISTINE KATSOUDAS AT 7353 07/01/18 SG    Enterobacter cloacae complex NOT DETECTED NOT DETECTED Final   Escherichia coli DETECTED (A) NOT DETECTED Final    Comment: CRITICAL RESULT CALLED TO, READ BACK BY AND VERIFIED WITH: CHRISTINE KATSOUDAS AT 2992 07/01/18 SG    Klebsiella oxytoca NOT DETECTED NOT DETECTED Final   Klebsiella pneumoniae NOT DETECTED NOT DETECTED Final   Proteus species NOT DETECTED NOT DETECTED Final   Serratia marcescens NOT DETECTED NOT DETECTED Final   Carbapenem resistance NOT DETECTED NOT DETECTED Final   Haemophilus influenzae NOT DETECTED NOT DETECTED Final   Neisseria meningitidis NOT DETECTED NOT DETECTED Final   Pseudomonas aeruginosa NOT DETECTED NOT DETECTED Final   Candida albicans NOT DETECTED NOT DETECTED Final   Candida glabrata NOT DETECTED NOT DETECTED Final   Candida krusei NOT DETECTED NOT DETECTED Final   Candida parapsilosis NOT DETECTED NOT DETECTED Final   Candida tropicalis NOT DETECTED NOT DETECTED Final    Comment: Performed at [facility], [address] [place], [place] [address]  MRSA PCR Screening     Status: None   Collection Time: 07/01/18  9:53 AM   Specimen: Nasopharyngeal  Result Value Ref Range Status   MRSA by PCR NEGATIVE NEGATIVE Final    Comment:        The GeneXpert MRSA Assay (FDA approved for NASAL specimens only), is one component of a comprehensive MRSA colonization surveillance program. It is not intended to diagnose MRSA infection nor to guide or monitor treatment for MRSA infections. Performed at [facility], [address] [place], [place] [address]   Culture, respiratory  (non-expectorated)     Status: None   Collection Time: 07/02/18  6:48 PM   Specimen: Tracheal Aspirate; Respiratory  Result Value Ref Range Status   Specimen Description   Final  TRACHEAL ASPIRATE Performed at [facility], [address] [place], [place] [address]    Special Requests   Final    NONE Performed at [facility], [address] [place], [place] [address]    Gram Stain   Final    RARE WBC PRESENT, PREDOMINANTLY PMN NO ORGANISMS SEEN    Culture   Final    RARE Consistent with normal respiratory flora. Performed at [facility], [place] [address], [place], [place] [address]    Report Status 07/05/2018 FINAL  Final  Culture, blood (routine x 2)     Status: None   Collection Time: 07/03/18 12:51 AM   Specimen: BLOOD  Result Value Ref Range Status   Specimen Description BLOOD RIGHT HAND  Final   Special Requests   Final    BOTTLES DRAWN AEROBIC AND ANAEROBIC Blood Culture adequate volume   Culture   Final    NO GROWTH 5 DAYS Performed at [facility], [address], [place], [place] [address]    Report Status 07/08/2018 FINAL  Final  Culture, blood (routine x 2)     Status: None   Collection Time: 07/03/18 12:58 AM   Specimen: BLOOD  Result Value Ref Range Status   Specimen Description BLOOD LEFT HAND  Final   Special Requests   Final    BOTTLES DRAWN AEROBIC AND ANAEROBIC Blood Culture adequate volume   Culture   Final    NO GROWTH 5 DAYS Performed at [facility], [address], [place],  [address]    Report Status 07/08/2018 FINAL  Final    RADIOLOGY:  No results found.   Management plans discussed with the patient, family and they are in agreement.  CODE STATUS: DNR   TOTAL TIME TAKING CARE OF THIS PATIENT: 38 minutes.    Kenden Brandt M.D on 07/10/2018 at 9:52 AM  Between 7am to 6pm - Pager - [phone]  After 6pm go to [email or web address] - Proofreader  [facility]  Office  [phone]  CC: Primary care physician; Patient, No Pcp Per   Note: This dictation was prepared with Dragon dictation along with smaller phrase technology. Any transcriptional errors that result from this process are unintentional.

## 2018-07-10 NOTE — Progress Notes (Signed)
Follow up visit to new Hospice home referral. Patient seen lying in bed, eyes open and alert. Writer relayed the discharge plan, patient appeared to understand, he appears very weak. Condom cath in place draining dark yellow urine. Patient is not eating or drinking, no PRN medications given. Report called to the hospice home, EMS notified for transport. Hospital care team and patient updated. Flo Shanks BSN, RN, [facility] [phone]

## 2018-07-29 ENCOUNTER — Other Ambulatory Visit: Payer: Self-pay

## 2018-07-29 ENCOUNTER — Encounter: Payer: Self-pay | Admitting: Emergency Medicine

## 2018-07-29 ENCOUNTER — Emergency Department: Payer: Medicaid Other

## 2018-07-29 ENCOUNTER — Inpatient Hospital Stay
Admission: EM | Admit: 2018-07-29 | Discharge: 2018-08-06 | DRG: 314 | Disposition: A | Discharge status: Home Health | Payer: Medicaid Other | Attending: Internal Medicine | Admitting: Internal Medicine

## 2018-07-29 DIAGNOSIS — N186 End stage renal disease: Secondary | ICD-10-CM | POA: Diagnosis present

## 2018-07-29 DIAGNOSIS — Y838 Other surgical procedures as the cause of abnormal reaction of the patient, or of later complication, without mention of misadventure at the time of the procedure: Secondary | ICD-10-CM | POA: Diagnosis present

## 2018-07-29 DIAGNOSIS — L89150 Pressure ulcer of sacral region, unstageable: Secondary | ICD-10-CM | POA: Diagnosis present

## 2018-07-29 DIAGNOSIS — Z20828 Contact with and (suspected) exposure to other viral communicable diseases: Secondary | ICD-10-CM | POA: Diagnosis present

## 2018-07-29 DIAGNOSIS — N179 Acute kidney failure, unspecified: Secondary | ICD-10-CM | POA: Diagnosis present

## 2018-07-29 DIAGNOSIS — F17210 Nicotine dependence, cigarettes, uncomplicated: Secondary | ICD-10-CM | POA: Diagnosis present

## 2018-07-29 DIAGNOSIS — E875 Hyperkalemia: Secondary | ICD-10-CM | POA: Diagnosis present

## 2018-07-29 DIAGNOSIS — Z992 Dependence on renal dialysis: Secondary | ICD-10-CM | POA: Diagnosis not present

## 2018-07-29 DIAGNOSIS — E871 Hypo-osmolality and hyponatremia: Secondary | ICD-10-CM | POA: Diagnosis present

## 2018-07-29 DIAGNOSIS — R64 Cachexia: Secondary | ICD-10-CM | POA: Diagnosis present

## 2018-07-29 DIAGNOSIS — Z7289 Other problems related to lifestyle: Secondary | ICD-10-CM | POA: Diagnosis not present

## 2018-07-29 DIAGNOSIS — B9561 Methicillin susceptible Staphylococcus aureus infection as the cause of diseases classified elsewhere: Secondary | ICD-10-CM | POA: Diagnosis present

## 2018-07-29 DIAGNOSIS — Z8611 Personal history of tuberculosis: Secondary | ICD-10-CM

## 2018-07-29 DIAGNOSIS — J189 Pneumonia, unspecified organism: Secondary | ICD-10-CM | POA: Diagnosis present

## 2018-07-29 DIAGNOSIS — T827XXA Infection and inflammatory reaction due to other cardiac and vascular devices, implants and grafts, initial encounter: Principal | ICD-10-CM | POA: Diagnosis present

## 2018-07-29 DIAGNOSIS — Z8249 Family history of ischemic heart disease and other diseases of the circulatory system: Secondary | ICD-10-CM

## 2018-07-29 DIAGNOSIS — R634 Abnormal weight loss: Secondary | ICD-10-CM | POA: Diagnosis present

## 2018-07-29 DIAGNOSIS — I959 Hypotension, unspecified: Secondary | ICD-10-CM

## 2018-07-29 DIAGNOSIS — L8990 Pressure ulcer of unspecified site, unspecified stage: Secondary | ICD-10-CM | POA: Insufficient documentation

## 2018-07-29 DIAGNOSIS — I120 Hypertensive chronic kidney disease with stage 5 chronic kidney disease or end stage renal disease: Secondary | ICD-10-CM | POA: Diagnosis present

## 2018-07-29 DIAGNOSIS — D638 Anemia in other chronic diseases classified elsewhere: Secondary | ICD-10-CM | POA: Diagnosis present

## 2018-07-29 DIAGNOSIS — B20 Human immunodeficiency virus [HIV] disease: Secondary | ICD-10-CM | POA: Diagnosis present

## 2018-07-29 DIAGNOSIS — E872 Acidosis: Secondary | ICD-10-CM | POA: Diagnosis not present

## 2018-07-29 DIAGNOSIS — N29 Other disorders of kidney and ureter in diseases classified elsewhere: Secondary | ICD-10-CM | POA: Diagnosis present

## 2018-07-29 DIAGNOSIS — M109 Gout, unspecified: Secondary | ICD-10-CM | POA: Diagnosis present

## 2018-07-29 DIAGNOSIS — R627 Adult failure to thrive: Secondary | ICD-10-CM

## 2018-07-29 DIAGNOSIS — R7881 Bacteremia: Secondary | ICD-10-CM | POA: Diagnosis present

## 2018-07-29 DIAGNOSIS — E43 Unspecified severe protein-calorie malnutrition: Secondary | ICD-10-CM | POA: Diagnosis present

## 2018-07-29 DIAGNOSIS — Z681 Body mass index (BMI) 19 or less, adult: Secondary | ICD-10-CM | POA: Diagnosis not present

## 2018-07-29 HISTORY — DX: Disorder of kidney and ureter, unspecified: N28.9

## 2018-07-29 LAB — COMPREHENSIVE METABOLIC PANEL
ALT: 27 U/L (ref 0–44)
AST: 49 U/L — ABNORMAL HIGH (ref 15–41)
Albumin: 2.8 g/dL — ABNORMAL LOW (ref 3.5–5.0)
Alkaline Phosphatase: 250 U/L — ABNORMAL HIGH (ref 38–126)
Anion gap: 13 (ref 5–15)
BUN: 27 mg/dL — ABNORMAL HIGH (ref 6–20)
CO2: 25 mmol/L (ref 22–32)
Calcium: 8.9 mg/dL (ref 8.9–10.3)
Chloride: 92 mmol/L — ABNORMAL LOW (ref 98–111)
Creatinine, Ser: 1.56 mg/dL — ABNORMAL HIGH (ref 0.61–1.24)
GFR calc Af Amer: 60 mL/min — ABNORMAL LOW (ref >60)
GFR calc non Af Amer: 51 mL/min — ABNORMAL LOW (ref >60)
Glucose, Bld: 108 mg/dL — ABNORMAL HIGH (ref 70–99)
Potassium: 6 mmol/L — ABNORMAL HIGH (ref 3.5–5.1)
Sodium: 130 mmol/L — ABNORMAL LOW (ref 135–145)
Total Bilirubin: 1.3 mg/dL — ABNORMAL HIGH (ref 0.3–1.2)
Total Protein: 8.7 g/dL — ABNORMAL HIGH (ref 6.5–8.1)

## 2018-07-29 LAB — SAMPLE TO BLOOD BANK

## 2018-07-29 LAB — CBC WITH DIFFERENTIAL/PLATELET
Abs Immature Granulocytes: 0.2 10*3/uL — ABNORMAL HIGH (ref 0.00–0.07)
Basophils Absolute: 0 10*3/uL (ref 0.0–0.1)
Basophils Relative: 0 %
Eosinophils Absolute: 0 10*3/uL (ref 0.0–0.5)
Eosinophils Relative: 0 %
HCT: 30.3 % — ABNORMAL LOW (ref 39.0–52.0)
Hemoglobin: 10.2 g/dL — ABNORMAL LOW (ref 13.0–17.0)
Immature Granulocytes: 2 %
Lymphocytes Relative: 33 %
Lymphs Abs: 3.2 10*3/uL (ref 0.7–4.0)
MCH: 32.2 pg (ref 26.0–34.0)
MCHC: 33.7 g/dL (ref 30.0–36.0)
MCV: 95.6 fL (ref 80.0–100.0)
Monocytes Absolute: 0.7 10*3/uL (ref 0.1–1.0)
Monocytes Relative: 7 %
Neutro Abs: 5.5 10*3/uL (ref 1.7–7.7)
Neutrophils Relative %: 58 %
Platelets: 389 10*3/uL (ref 150–400)
RBC: 3.17 MIL/uL — ABNORMAL LOW (ref 4.22–5.81)
RDW: 14.7 % (ref 11.5–15.5)
WBC: 9.6 10*3/uL (ref 4.0–10.5)
nRBC: 0 % (ref 0.0–0.2)

## 2018-07-29 LAB — BASIC METABOLIC PANEL
Anion gap: 9 (ref 5–15)
BUN: 28 mg/dL — ABNORMAL HIGH (ref 6–20)
CO2: 24 mmol/L (ref 22–32)
Calcium: 8.4 mg/dL — ABNORMAL LOW (ref 8.9–10.3)
Chloride: 94 mmol/L — ABNORMAL LOW (ref 98–111)
Creatinine, Ser: 1.61 mg/dL — ABNORMAL HIGH (ref 0.61–1.24)
GFR calc Af Amer: 57 mL/min — ABNORMAL LOW (ref >60)
GFR calc non Af Amer: 49 mL/min — ABNORMAL LOW (ref >60)
Glucose, Bld: 111 mg/dL — ABNORMAL HIGH (ref 70–99)
Potassium: 5.5 mmol/L — ABNORMAL HIGH (ref 3.5–5.1)
Sodium: 127 mmol/L — ABNORMAL LOW (ref 135–145)

## 2018-07-29 LAB — SARS CORONAVIRUS 2 BY RT PCR (HOSPITAL ORDER, PERFORMED IN ~~LOC~~ HOSPITAL LAB): SARS Coronavirus 2: NEGATIVE

## 2018-07-29 LAB — URINALYSIS, COMPLETE (UACMP) WITH MICROSCOPIC
Bilirubin Urine: NEGATIVE
Glucose, UA: NEGATIVE mg/dL
Ketones, ur: NEGATIVE mg/dL
Leukocytes,Ua: NEGATIVE
Nitrite: NEGATIVE
Protein, ur: NEGATIVE mg/dL
Specific Gravity, Urine: 1.013 (ref 1.005–1.030)
Squamous Epithelial / HPF: NONE SEEN (ref 0–5)
pH: 6 (ref 5.0–8.0)

## 2018-07-29 LAB — LIPASE, BLOOD: Lipase: 24 U/L (ref 11–51)

## 2018-07-29 LAB — BRAIN NATRIURETIC PEPTIDE: B Natriuretic Peptide: 61 pg/mL (ref 0.0–100.0)

## 2018-07-29 LAB — TROPONIN I (HIGH SENSITIVITY)
Troponin I (High Sensitivity): 23 ng/L — ABNORMAL HIGH (ref <18)
Troponin I (High Sensitivity): 27 ng/L — ABNORMAL HIGH (ref <18)

## 2018-07-29 LAB — LACTIC ACID, PLASMA: Lactic Acid, Venous: 1.5 mmol/L (ref 0.5–1.9)

## 2018-07-29 MED ORDER — ONDANSETRON HCL 4 MG/2ML IJ SOLN: 4.0000 mg | Freq: Four times a day (QID) | INTRAMUSCULAR | Status: DC | PRN

## 2018-07-29 MED ORDER — SODIUM CHLORIDE 0.9 % IV SOLN
INTRAVENOUS | Status: DC
  Administered 2018-07-29 – 2018-07-31 (×4): via INTRAVENOUS

## 2018-07-29 MED ORDER — ALBUTEROL SULFATE (2.5 MG/3ML) 0.083% IN NEBU: 2.5000 mg | INHALATION_SOLUTION | RESPIRATORY_TRACT | Status: DC | PRN

## 2018-07-29 MED ORDER — ACETAMINOPHEN 325 MG PO TABS
650.0000 mg | ORAL_TABLET | Freq: Four times a day (QID) | ORAL | Status: DC | PRN
  Administered 2018-07-30 – 2018-08-05 (×2): 650 mg via ORAL
  Filled 2018-07-29 (×2): qty 2

## 2018-07-29 MED ORDER — BISACODYL 5 MG PO TBEC: 5.0000 mg | DELAYED_RELEASE_TABLET | Freq: Every day | ORAL | Status: DC | PRN

## 2018-07-29 MED ORDER — SENNOSIDES-DOCUSATE SODIUM 8.6-50 MG PO TABS: 1.0000 | ORAL_TABLET | Freq: Every evening | ORAL | Status: DC | PRN

## 2018-07-29 MED ORDER — ONDANSETRON HCL 4 MG PO TABS: 4.0000 mg | ORAL_TABLET | Freq: Four times a day (QID) | ORAL | Status: DC | PRN

## 2018-07-29 MED ORDER — GUAIFENESIN 100 MG/5ML PO SOLN: 5.0000 mL | ORAL | Status: DC | PRN

## 2018-07-29 MED ORDER — SODIUM CHLORIDE 0.9 % IV SOLN
2.0000 g | INTRAVENOUS | Status: DC
  Filled 2018-07-29: qty 20

## 2018-07-29 MED ORDER — SODIUM CHLORIDE 0.9 % IV SOLN
500.0000 mg | Freq: Once | INTRAVENOUS | Status: AC
  Administered 2018-07-29: 500 mg via INTRAVENOUS
  Filled 2018-07-29: qty 500

## 2018-07-29 MED ORDER — SODIUM CHLORIDE 0.9 % IV BOLUS
1000.0000 mL | Freq: Once | INTRAVENOUS | Status: AC
  Administered 2018-07-29: 1000 mL via INTRAVENOUS

## 2018-07-29 MED ORDER — AZITHROMYCIN 500 MG PO TABS: 500.0000 mg | ORAL_TABLET | Freq: Every day | ORAL | Status: DC

## 2018-07-29 MED ORDER — ACETAMINOPHEN 650 MG RE SUPP: 650.0000 mg | Freq: Four times a day (QID) | RECTAL | Status: DC | PRN

## 2018-07-29 MED ORDER — HEPARIN SODIUM (PORCINE) 5000 UNIT/ML IJ SOLN
5000.0000 [IU] | Freq: Three times a day (TID) | INTRAMUSCULAR | Status: DC
  Administered 2018-07-29 – 2018-07-30 (×3): 5000 [IU] via SUBCUTANEOUS
  Filled 2018-07-29 (×4): qty 1

## 2018-07-29 MED ORDER — HYDROCODONE-ACETAMINOPHEN 5-325 MG PO TABS: 1.0000 | ORAL_TABLET | ORAL | Status: DC | PRN

## 2018-07-29 MED ORDER — SODIUM CHLORIDE 0.9 % IV SOLN
1.0000 g | Freq: Once | INTRAVENOUS | Status: AC
  Administered 2018-07-29: 1 g via INTRAVENOUS
  Filled 2018-07-29: qty 1

## 2018-07-29 NOTE — ED Notes (Signed)
Pt was changed and given a new gown and had bed sheets changed.

## 2018-07-29 NOTE — ED Provider Notes (Signed)
[facility] Emergency Department Provider Note   ____________________________________________   First MD Initiated Contact with Patient 07/29/18 1557     (approximate)  I have reviewed the triage vital signs and the nursing notes.   HISTORY  Chief Complaint Chronic Renal Failure    HPI Joshua Dougherty is a 48 y.o. male who reports he was in hospice care and was not receiving any medications.  His HIV medicines were not being given to him.  Also he reports he did not get his dialysis that he supposed to get.  He is losing a lot of weight.  Thing hurts her neck currently.         Past Medical History:  Diagnosis Date  . HIV (human immunodeficiency virus infection) ([place])   . Renal disorder   . Tuberculosis     Patient Active Problem List   Diagnosis Date Noted  . 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 CAP (community acquired pneumonia) 07/01/2018  . Protein-calorie malnutrition, severe 03/19/2018  . Hemoptysis 03/18/2018  . Tobacco abuse 05/20/2014  . Asymptomatic HIV infection ([place]) 05/20/2014    Past Surgical History:  Procedure Laterality Date  . ABSCESS DRAINAGE  2013   chest wall; patient states it was not contiguous with his lung however this was an operation under general anesthesia  . FLEXIBLE BRONCHOSCOPY Right 03/28/2018   Procedure: FLEXIBLE BRONCHOSCOPY, RIGHT;  Surgeon: Laverle Hobby, MD;  Location: [facility] ORS;  Service: Pulmonary;  Laterality: Right;    Prior to Admission medications   Not on File    Allergies Patient has no known allergies.  No family history on file.  Social History Social History   Tobacco Use  . Smoking status: Current Every Day Smoker    Packs/day: 0.25  . Smokeless tobacco: Current User  Substance Use Topics  . Alcohol use: Yes    Alcohol/week: 2.0 - 3.0 standard drinks    Types: 2 - 3 Standard drinks or equivalent per week    Frequency: Never  . Drug use: No    Review of  Systems  Constitutional: No fever/chills Eyes: No visual changes. ENT: No sore throat. Cardiovascular: Denies chest pain. Respiratory: Denies shortness of breath. Gastrointestinal: No abdominal pain.  No nausea, no vomiting.  No diarrhea.  No constipation. Genitourinary: Negative for dysuria. Musculoskeletal: Negative for back pain. Skin: Negative for rash. Neurological: Negative for headaches, focal weakness ____________________________________________   PHYSICAL EXAM:  VITAL SIGNS: ED Triage Vitals  Enc Vitals Group     BP 07/29/18 1518 (!) 85/67     Pulse Rate 07/29/18 1518 (!) 140     Resp 07/29/18 1518 (!) 24     Temp 07/29/18 1518 98.2 F (36.8 C)     Temp Source 07/29/18 1518 Oral     SpO2 07/29/18 1518 100 %     Weight 07/29/18 1519 150 lb (68 kg)     Height 07/29/18 1519 _0  (1.93 m)     Head Circumference --      Peak Flow --      Pain Score 07/29/18 1518 10     Pain Loc --      Pain Edu? --      Excl. in [place]? --     Constitutional: Alert and oriented.  Patient is weak and cachectic Eyes: Conjunctivae are normal.  Head: Atraumatic. Nose: No congestion/rhinnorhea. Mouth/Throat: Mucous membranes are moist.  Oropharynx non-erythematous. Neck: No stridor.   Cardiovascular: Normal rate, regular rhythm. Grossly normal heart  sounds.  Good peripheral circulation. Respiratory: Normal respiratory effort.  No retractions. Lungs CTAB. Gastrointestinal: Soft and nontender. No distention. No abdominal bruits. No CVA tenderness. Musculoskeletal: No lower extremity tenderness nor edema.   Neurologic:  Normal speech and language. No gross focal neurologic deficits are appreciated. Skin:  Skin is warm, dry and intact. No rash noted. Psychiatric: Mood and affect are normal. Speech and behavior are normal.  ____________________________________________   LABS (all labs ordered are listed, but only abnormal results are displayed)  Labs Reviewed  COMPREHENSIVE METABOLIC  PANEL - Abnormal; Notable for the following components:      Result Value   Sodium 130 (*)    Potassium 6.0 (*)    Chloride 92 (*)    Glucose, Bld 108 (*)    BUN 27 (*)    Creatinine, Ser 1.56 (*)    Total Protein 8.7 (*)    Albumin 2.8 (*)    AST 49 (*)    Alkaline Phosphatase 250 (*)    Total Bilirubin 1.3 (*)    GFR calc non Af Amer 51 (*)    GFR calc Af Amer 60 (*)    All other components within normal limits  TROPONIN I (HIGH SENSITIVITY) - Abnormal; Notable for the following components:   Troponin I (High Sensitivity) 23 (*)    All other components within normal limits  TROPONIN I (HIGH SENSITIVITY) - Abnormal; Notable for the following components:   Troponin I (High Sensitivity) 27 (*)    All other components within normal limits  CBC WITH DIFFERENTIAL/PLATELET - Abnormal; Notable for the following components:   RBC 3.17 (*)    Hemoglobin 10.2 (*)    HCT 30.3 (*)    Abs Immature Granulocytes 0.20 (*)    All other components within normal limits  BASIC METABOLIC PANEL - Abnormal; Notable for the following components:   Sodium 127 (*)    Potassium 5.5 (*)    Chloride 94 (*)    Glucose, Bld 111 (*)    BUN 28 (*)    Creatinine, Ser 1.61 (*)    Calcium 8.4 (*)    GFR calc non Af Amer 49 (*)    GFR calc Af Amer 57 (*)    All other components within normal limits  CULTURE, BLOOD (ROUTINE X 2)  CULTURE, BLOOD (ROUTINE X 2)  LIPASE, BLOOD  LACTIC ACID, PLASMA  BRAIN NATRIURETIC PEPTIDE  LACTIC ACID, PLASMA  URINALYSIS, COMPLETE (UACMP) WITH MICROSCOPIC  SAMPLE TO BLOOD BANK   ____________________________________________  EKG  EKG read interpreted by me shows sinus tachycardia rate of 102 normal axis nonspecific ST-T wave changes are multiple flipped T's in the chest leads which are new from last month. ____________________________________________  RADIOLOGY  ED MD interpretation: Chest x-ray read by radiology reviewed by me shows a patchy infiltrate in the right  base with a small pleural effusion.  This appears to be somewhat better than previously.  Official radiology report(s): Dg Chest Portable 1 View  Result Date: 07/29/2018 CLINICAL DATA:  Recent pneumonia.  HIV disease. EXAM: PORTABLE CHEST 1 VIEW COMPARISON:  July 07, 2018 FINDINGS: Endotracheal tube and nasogastric tube have been removed. No pneumothorax. There is patchy airspace opacity in the right lower lobe with small right pleural effusion. Lungs elsewhere are clear. Heart size and pulmonary vascularity are normal. No adenopathy. No bone lesions. IMPRESSION: Patchy infiltrate right base with small right pleural effusion. There is less consolidation in this area compared to most recent study. Lungs  elsewhere clear. Heart size normal. No evident adenopathy. Electronically Signed   By: Lowella Grip III M.D.   On: 07/29/2018 16:24    ____________________________________________   PROCEDURES  Procedure(s) performed (including Critical Care):  Procedures   ____________________________________________   INITIAL IMPRESSION / ASSESSMENT AND PLAN / ED COURSE  Laydon Martis was evaluated in Emergency Department on 07/29/2018 for the symptoms described in the history of present illness. He was evaluated in the context of the global COVID-19 pandemic, which necessitated consideration that the patient might be at risk for infection with the SARS-CoV-2 virus that causes COVID-19. Institutional protocols and algorithms that pertain to the evaluation of patients at risk for COVID-19 are in a state of rapid change based on information released by regulatory bodies including the CDC and federal and state organizations. These policies and algorithms were followed during the patient's care in the ED.    Patient is hyponatremic hyperkalemic has weight loss cachexia       ____________________________________________   FINAL CLINICAL IMPRESSION(S) / ED DIAGNOSES  Final diagnoses:  Weight loss   Cachexia (HCC)  Hyponatremia  Hyperkalemia  Failure to thrive in adult  Hypotension, unspecified hypotension type     ED Discharge Orders    None       Note:  This document was prepared using Dragon voice recognition software and may include unintentional dictation errors.    Nena Polio, MD 07/29/18 [phone]

## 2018-07-29 NOTE — ED Notes (Signed)
Per Dr. Cinda Quest, discontinue repeat troponin and repeat lactic acid.

## 2018-07-29 NOTE — ED Notes (Signed)
ED TO INPATIENT HANDOFF REPORT  ED Nurse Name and Phone #:  Berenda Morale RN [phone] S Name/Age/Gender Joshua Dougherty 48 y.o. male Room/Bed: ED24A/ED24A  Code Status   Code Status: Prior  Home/SNF/Other Home Patient oriented to: self, place, time and situation Is this baseline? Yes   Triage Complete: Triage complete  Chief Complaint liver problems ems  Triage Note Per Hospice RN, pt was admitted to their facility for end of life care several weeks ago. Hospice reports pt stopped dialysis as well. Per hospice pt decided today that he wanted to revoke his end of life care and come to the ED for aggressive treatment.    Allergies No Known Allergies  Level of Care/Admitting Diagnosis ED Disposition    ED Disposition Condition [facility] Area: [place] [[id number]]  Level of Care: Med-Surg [16]  Covid Evaluation: Confirmed COVID Negative  Diagnosis: Pneumonia [[id number]]  Admitting Physician: Demetrios Loll [[id number]]  Attending Physician: Demetrios Loll [[id number]]  Estimated length of stay: past midnight tomorrow  Certification:: I certify this patient will need inpatient services for at least 2 midnights  PT Class (Do Not Modify): Inpatient [101]  PT Acc Code (Do Not Modify): Private [1]       B Medical/Surgery History Past Medical History:  Diagnosis Date  . HIV (human immunodeficiency virus infection) ([place])   . Renal disorder   . Tuberculosis    Past Surgical History:  Procedure Laterality Date  . ABSCESS DRAINAGE  2013   chest wall; patient states it was not contiguous with his lung however this was an operation under general anesthesia  . FLEXIBLE BRONCHOSCOPY Right 03/28/2018   Procedure: FLEXIBLE BRONCHOSCOPY, RIGHT;  Surgeon: Laverle Hobby, MD;  Location: [facility] ORS;  Service: Pulmonary;  Laterality: Right;     A IV Location/Drains/Wounds Patient Lines/Drains/Airways Status   Active Line/Drains/Airways    Name:    Placement date:   Placement time:   Site:   Days:   Peripheral IV 07/29/18 Left Antecubital   07/29/18    1553    Antecubital   less than 1   Peripheral IV 07/29/18 Right Forearm   07/29/18    1553    Forearm   less than 1   Hemodialysis Catheter Left Femoral vein Triple lumen Temporary   07/02/18    1633    Femoral vein   27          Intake/Output Last 24 hours  Intake/Output Summary (Last 24 hours) at 07/29/2018 2027 Last data filed at 07/29/2018 2018 Gross per 24 hour  Intake 350 ml  Output -  Net 350 ml    Labs/Imaging Results for orders placed or performed during the hospital encounter of 07/29/18 (from the past 48 hour(s))  Troponin I (High Sensitivity)     Status: Abnormal   Collection Time: 07/29/18  3:50 PM  Result Value Ref Range   Troponin I (High Sensitivity) 27 (H) <18 ng/L    Comment: (NOTE) Elevated high sensitivity troponin I (hsTnI) values and significant  changes across serial measurements may suggest ACS but many other  chronic and acute conditions are known to elevate hsTnI results.  Refer to the "Links" section for chest pain algorithms and additional  guidance. Performed at [facility], [address] [place], [place] [address]   Comprehensive metabolic panel     Status: Abnormal   Collection Time: 07/29/18  4:00 PM  Result Value Ref Range   Sodium 130 (L)  135 - 145 mmol/L   Potassium 6.0 (H) 3.5 - 5.1 mmol/L    Comment: HEMOLYSIS AT THIS LEVEL MAY AFFECT RESULT   Chloride 92 (L) 98 - 111 mmol/L   CO2 25 22 - 32 mmol/L   Glucose, Bld 108 (H) 70 - 99 mg/dL   BUN 27 (H) 6 - 20 mg/dL   Creatinine, Ser 1.56 (H) 0.61 - 1.24 mg/dL   Calcium 8.9 8.9 - 10.3 mg/dL   Total Protein 8.7 (H) 6.5 - 8.1 g/dL   Albumin 2.8 (L) 3.5 - 5.0 g/dL   AST 49 (H) 15 - 41 U/L   ALT 27 0 - 44 U/L   Alkaline Phosphatase 250 (H) 38 - 126 U/L   Total Bilirubin 1.3 (H) 0.3 - 1.2 mg/dL   GFR calc non Af Amer 51 (L) >60 mL/min   GFR calc Af Amer 60 (L) >60 mL/min    Anion gap 13 5 - 15    Comment: Performed at [facility], [address] [place], [place] [address]  Lipase, blood     Status: None   Collection Time: 07/29/18  4:00 PM  Result Value Ref Range   Lipase 24 11 - 51 U/L    Comment: Performed at [facility], [place], [place] [address]  Troponin I (High Sensitivity)     Status: Abnormal   Collection Time: 07/29/18  4:00 PM  Result Value Ref Range   Troponin I (High Sensitivity) 23 (H) <18 ng/L    Comment: (NOTE) Elevated high sensitivity troponin I (hsTnI) values and significant  changes across serial measurements may suggest ACS but many other  chronic and acute conditions are known to elevate hsTnI results.  Refer to the "Links" section for chest pain algorithms and additional  guidance. Performed at [facility], [address] [place], [place] [address]   CBC with Differential     Status: Abnormal   Collection Time: 07/29/18  4:00 PM  Result Value Ref Range   WBC 9.6 4.0 - 10.5 K/uL   RBC 3.17 (L) 4.22 - 5.81 MIL/uL   Hemoglobin 10.2 (L) 13.0 - 17.0 g/dL   HCT 30.3 (L) 39.0 - 52.0 %   MCV 95.6 80.0 - 100.0 fL   MCH 32.2 26.0 - 34.0 pg   MCHC 33.7 30.0 - 36.0 g/dL   RDW 14.7 11.5 - 15.5 %   Platelets 389 150 - 400 K/uL   nRBC 0.0 0.0 - 0.2 %   Neutrophils Relative % 58 %   Neutro Abs 5.5 1.7 - 7.7 K/uL   Lymphocytes Relative 33 %   Lymphs Abs 3.2 0.7 - 4.0 K/uL   Monocytes Relative 7 %   Monocytes Absolute 0.7 0.1 - 1.0 K/uL   Eosinophils Relative 0 %   Eosinophils Absolute 0.0 0.0 - 0.5 K/uL   Basophils Relative 0 %   Basophils Absolute 0.0 0.0 - 0.1 K/uL   Immature Granulocytes 2 %   Abs Immature Granulocytes 0.20 (H) 0.00 - 0.07 K/uL    Comment: Performed at [facility], [address] [place], [place] [address]  Brain natriuretic peptide     Status: None   Collection Time: 07/29/18  4:01 PM  Result Value Ref Range   B Natriuretic Peptide 61.0 0.0 - 100.0  pg/mL    Comment: Performed at [facility], [address] [place], [place] [address]  Lactic acid, plasma     Status: None  Collection Time: 07/29/18  5:31 PM  Result Value Ref Range   Lactic Acid, Venous 1.5 0.5 - 1.9 mmol/L    Comment: Performed at [facility], [address] [place], [place] [address]  Basic metabolic panel     Status: Abnormal   Collection Time: 07/29/18  5:31 PM  Result Value Ref Range   Sodium 127 (L) 135 - 145 mmol/L   Potassium 5.5 (H) 3.5 - 5.1 mmol/L   Chloride 94 (L) 98 - 111 mmol/L   CO2 24 22 - 32 mmol/L   Glucose, Bld 111 (H) 70 - 99 mg/dL   BUN 28 (H) 6 - 20 mg/dL   Creatinine, Ser 1.61 (H) 0.61 - 1.24 mg/dL   Calcium 8.4 (L) 8.9 - 10.3 mg/dL   GFR calc non Af Amer 49 (L) >60 mL/min   GFR calc Af Amer 57 (L) >60 mL/min   Anion gap 9 5 - 15    Comment: Performed at [facility], [address], [place], [place] [address]  Sample to Blood Bank     Status: None   Collection Time: 07/29/18  5:31 PM  Result Value Ref Range   Blood Bank Specimen SAMPLE AVAILABLE FOR TESTING    Sample Expiration      08/01/2018,2359 Performed at [facility], [address] [place], [place] [address]   SARS Coronavirus 2 (CEPHEID - Performed in [facility] hospital lab), Hosp Order     Status: None   Collection Time: 07/29/18  6:59 PM   Specimen: Nasopharyngeal Swab  Result Value Ref Range   SARS Coronavirus 2 NEGATIVE NEGATIVE    Comment: (NOTE) If result is NEGATIVE SARS-CoV-2 target nucleic acids are NOT DETECTED. The SARS-CoV-2 RNA is generally detectable in upper and lower  respiratory specimens during the acute phase of infection. The lowest  concentration of SARS-CoV-2 viral copies this assay can detect is 250  copies / mL. A negative result does not preclude SARS-CoV-2 infection  and should not be used as the sole basis for treatment or other  patient management decisions.  A negative result may occur with   improper specimen collection / handling, submission of specimen other  than nasopharyngeal swab, presence of viral mutation(s) within the  areas targeted by this assay, and inadequate Dougherty of viral copies  (<250 copies / mL). A negative result must be combined with clinical  observations, patient history, and epidemiological information. If result is POSITIVE SARS-CoV-2 target nucleic acids are DETECTED. The SARS-CoV-2 RNA is generally detectable in upper and lower  respiratory specimens dur ing the acute phase of infection.  Positive  results are indicative of active infection with SARS-CoV-2.  Clinical  correlation with patient history and other diagnostic information is  necessary to determine patient infection status.  Positive results do  not rule out bacterial infection or co-infection with other viruses. If result is PRESUMPTIVE POSTIVE SARS-CoV-2 nucleic acids MAY BE PRESENT.   A presumptive positive result was obtained on the submitted specimen  and confirmed on repeat testing.  While 2019 novel coronavirus  (SARS-CoV-2) nucleic acids may be present in the submitted sample  additional confirmatory testing may be necessary for epidemiological  and / or clinical management purposes  to differentiate between  SARS-CoV-2 and other Sarbecovirus currently known to infect humans.  If clinically indicated additional testing with an alternate test  methodology [phone]) is advised. The SARS-CoV-2 RNA is generally  detectable in upper and lower respiratory sp ecimens during the  acute  phase of infection. The expected result is Negative. Fact Sheet for Patients:  StrictlyIdeas.no Fact Sheet for Healthcare Providers: BankingDealers.co.za This test is not yet approved or cleared by the [place] FDA and has been authorized for detection and/or diagnosis of SARS-CoV-2 by FDA under an Emergency Use Authorization (EUA).  This EUA will  remain in effect (meaning this test can be used) for the duration of the COVID-19 declaration under Section 564(b)(1) of the Act, 21 U.S.C. section 360bbb-3(b)(1), unless the authorization is terminated or revoked sooner. Performed at [facility], [address], [place], [place] [address]    Dg Chest Portable 1 View  Result Date: 07/29/2018 CLINICAL DATA:  Recent pneumonia.  HIV disease. EXAM: PORTABLE CHEST 1 VIEW COMPARISON:  July 07, 2018 FINDINGS: Endotracheal tube and nasogastric tube have been removed. No pneumothorax. There is patchy airspace opacity in the right lower lobe with small right pleural effusion. Lungs elsewhere are clear. Heart size and pulmonary vascularity are normal. No adenopathy. No bone lesions. IMPRESSION: Patchy infiltrate right base with small right pleural effusion. There is less consolidation in this area compared to most recent study. Lungs elsewhere clear. Heart size normal. No evident adenopathy. Electronically Signed   By: Lowella Grip III M.D.   On: 07/29/2018 16:24    Pending Labs Unresulted Labs (From admission, onward)    Start     Ordered   07/29/18 1834  Culture, blood (routine x 2)  BLOOD CULTURE X 2,   STAT     07/29/18 1834   07/29/18 1600  Lactic acid, plasma  Now then every 2 hours,   STAT     07/29/18 1600   07/29/18 1600  Urinalysis, Complete w Microscopic  ONCE - STAT,   STAT     07/29/18 1600   Signed and Held  Creatinine, serum  (heparin)  Once,   R    Comments: Baseline for heparin therapy IF NOT ALREADY DRAWN.    Signed and Held   Signed and Held  Basic metabolic panel  Tomorrow morning,   R     Signed and Held   Signed and Held  CBC  Tomorrow morning,   R     Signed and Held   Signed and Held  Sputum culture  (Non-severe pneumonia (non-ICU care) in adult without resistant organism risk factors.)  Once,   R    Question:  Patient immune status  Answer:  Immunocompromised   Signed and Held           Vitals/Pain Today's Vitals   07/29/18 1715 07/29/18 1730 07/29/18 1900 07/29/18 1930  BP:  98/72 111/81 112/86  Pulse:  (!) 113  98  Resp: (!) 23 (!) 26 (!) 22 19  Temp:      TempSrc:      SpO2:  100%  96%  Weight:      Height:      PainSc:        Isolation Precautions No active isolations  Medications Medications  ceFEPIme (MAXIPIME) 1 g in sodium chloride 0.9 % 100 mL IVPB (0 g Intravenous Stopped 07/29/18 2018)  azithromycin (ZITHROMAX) 500 mg in sodium chloride 0.9 % 250 mL IVPB (0 mg Intravenous Stopped 07/29/18 2018)  sodium chloride 0.9 % bolus 1,000 mL (1,000 mLs Intravenous New Bag/Given 07/29/18 1856)  sodium chloride 0.9 % bolus 1,000 mL (1,000 mLs Intravenous New Bag/Given 07/29/18 1856)    Mobility walks with person assist Low fall risk  Focused Assessments Pulmonary Assessment Handoff:  Lung sounds:   O2 Device: Room Air        R Recommendations: See Admitting Provider Note  Report given to:   Additional Notes:  Was on hospice, now wants treatment.

## 2018-07-29 NOTE — H&P (Signed)
[facility] [place] NAME: Joshua Dougherty    MR#:  [id number]  DATE OF BIRTH:  15-Feb-1970  DATE OF ADMISSION:  07/29/2018  PRIMARY CARE PHYSICIAN: Patient, No Pcp Per   REQUESTING/REFERRING PHYSICIAN: Dr. Rip Harbour.  CHIEF COMPLAINT:   Chief Complaint  Patient presents with  . Chronic Renal Failure   The patient came from hospice home and want treatment. HISTORY OF PRESENT ILLNESS:  Joshua Dougherty  is a 48 y.o. male with a known history of HIV, TB, CKD, he was on hemodialysis.  The patient was seen hospice home.  He said he did not receive any medication or treatment.  He left hospice home and to come to ED for treatment.  The patient denies any fever or chills, no cough or sputum or chest pain.  He was found hyponatremia and hyperkalemia.  Chest x-ray reported right-sided pneumonia.  He is treated with antibiotics in the ED.  Dr. Rip Harbour request admission. PAST MEDICAL HISTORY:   Past Medical History:  Diagnosis Date  . HIV (human immunodeficiency virus infection) ([place])   . Renal disorder   . Tuberculosis     PAST SURGICAL HISTORY:   Past Surgical History:  Procedure Laterality Date  . ABSCESS DRAINAGE  2013   chest wall; patient states it was not contiguous with his lung however this was an operation under general anesthesia  . FLEXIBLE BRONCHOSCOPY Right 03/28/2018   Procedure: FLEXIBLE BRONCHOSCOPY, RIGHT;  Surgeon: Laverle Hobby, MD;  Location: [facility] ORS;  Service: Pulmonary;  Laterality: Right;    SOCIAL HISTORY:   Social History   Tobacco Use  . Smoking status: Current Every Day Smoker    Packs/day: 0.25  . Smokeless tobacco: Current User  Substance Use Topics  . Alcohol use: Yes    Alcohol/week: 2.0 - 3.0 standard drinks    Types: 2 - 3 Standard drinks or equivalent per week    Frequency: Never    FAMILY HISTORY:  No family history on file.  Mother has hypertension hyperlipidemia.  DRUG ALLERGIES:  No Known  Allergies  REVIEW OF SYSTEMS:   Review of Systems  Constitutional: Positive for malaise/fatigue and weight loss. Negative for chills and fever.  HENT: Negative for sore throat.   Eyes: Negative for blurred vision and double vision.  Respiratory: Negative for cough, hemoptysis, shortness of breath, wheezing and stridor.   Cardiovascular: Negative for chest pain, palpitations, orthopnea and leg swelling.  Gastrointestinal: Negative for abdominal pain, blood in stool, diarrhea, melena, nausea and vomiting.  Genitourinary: Negative for dysuria, flank pain and hematuria.  Musculoskeletal: Negative for back pain and joint pain.  Skin: Negative for rash.  Neurological: Negative for dizziness, sensory change, focal weakness, seizures, loss of consciousness, weakness and headaches.  Endo/Heme/Allergies: Negative for polydipsia.  Psychiatric/Behavioral: Negative for depression. The patient is not nervous/anxious.     MEDICATIONS AT HOME:   Prior to Admission medications   Not on File      VITAL SIGNS:  Blood pressure 98/72, pulse (!) 113, temperature 98.2 F (36.8 C), temperature source Oral, resp. rate (!) 26, height _0  (1.93 m), weight 68 kg, SpO2 100 %.  PHYSICAL EXAMINATION:  Physical Exam  GENERAL:  48 y.o.-year-old patient lying in the bed with no acute distress.  Severe malnutrition. EYES: Pupils equal, round, reactive to light and accommodation. No scleral icterus. Extraocular muscles intact.  HEENT: Head atraumatic, normocephalic.  NECK:  Supple, no jugular venous distention. No thyroid enlargement,  no tenderness.  LUNGS: Normal breath sounds bilaterally, no wheezing, rales,rhonchi or crepitation. No use of accessory muscles of respiration.  CARDIOVASCULAR: S1, S2 normal. No murmurs, rubs, or gallops.  ABDOMEN: Soft, nontender, nondistended. Bowel sounds present. No organomegaly or mass.  EXTREMITIES: No pedal edema, cyanosis, or clubbing.  NEUROLOGIC: Cranial nerves II  through XII are intact. Muscle strength 5/5 in all extremities. Sensation intact. Gait not checked.  PSYCHIATRIC: The patient is alert and oriented x 3.  SKIN: No obvious rash, lesion, or ulcer.   LABORATORY PANEL:   CBC Recent Labs  Lab 07/29/18 1600  WBC 9.6  HGB 10.2*  HCT 30.3*  PLT 389   ------------------------------------------------------------------------------------------------------------------  Chemistries  Recent Labs  Lab 07/29/18 1600 07/29/18 1731  NA 130* 127*  K 6.0* 5.5*  CL 92* 94*  CO2 25 24  GLUCOSE 108* 111*  BUN 27* 28*  CREATININE 1.56* 1.61*  CALCIUM 8.9 8.4*  AST 49*  --   ALT 27  --   ALKPHOS 250*  --   BILITOT 1.3*  --    ------------------------------------------------------------------------------------------------------------------  Cardiac Enzymes No results for input(s): TROPONINI in the last 168 hours. ------------------------------------------------------------------------------------------------------------------  RADIOLOGY:  Dg Chest Portable 1 View  Result Date: 07/29/2018 CLINICAL DATA:  Recent pneumonia.  HIV disease. EXAM: PORTABLE CHEST 1 VIEW COMPARISON:  July 07, 2018 FINDINGS: Endotracheal tube and nasogastric tube have been removed. No pneumothorax. There is patchy airspace opacity in the right lower lobe with small right pleural effusion. Lungs elsewhere are clear. Heart size and pulmonary vascularity are normal. No adenopathy. No bone lesions. IMPRESSION: Patchy infiltrate right base with small right pleural effusion. There is less consolidation in this area compared to most recent study. Lungs elsewhere clear. Heart size normal. No evident adenopathy. Electronically Signed   By: Lowella Grip III M.D.   On: 07/29/2018 16:24      IMPRESSION AND PLAN:   Pneumonia. The patient will be admitted to medical floor. Continue antibiotics and follow-up cultures.  Hyponatremia.  Normal saline IV and follow-up sodium  level. Hyperkalemia.  IV fluid support and follow-up potassium level. CKD stage III.  Stable. Anemia of chronic disease. HIV.  The patient was in hospice care, but he wants treatment and full code.  All the records are reviewed and case discussed with ED provider. Management plans discussed with the patient, family and they are in agreement.  CODE STATUS: Full code.  TOTAL TIME TAKING CARE OF THIS PATIENT: 46 minutes.    Demetrios Loll M.D on 07/29/2018 at 7:09 PM  Between 7am to 6pm - Pager - [phone]  After 6pm go to [email or web address] - Proofreader  [facility]  Office  [phone]  CC: Primary care physician; Patient, No Pcp Per   Note: This dictation was prepared with Dragon dictation along with smaller phrase technology. Any transcriptional errors that result from this process are unin

## 2018-07-29 NOTE — ED Triage Notes (Signed)
Per Hospice RN, pt was admitted to their facility for end of life care several weeks ago. Hospice reports pt stopped dialysis as well. Per hospice pt decided today that he wanted to revoke his end of life care and come to the ED for aggressive treatment.

## 2018-07-30 DIAGNOSIS — N189 Chronic kidney disease, unspecified: Secondary | ICD-10-CM

## 2018-07-30 DIAGNOSIS — R627 Adult failure to thrive: Secondary | ICD-10-CM

## 2018-07-30 DIAGNOSIS — E871 Hypo-osmolality and hyponatremia: Secondary | ICD-10-CM

## 2018-07-30 DIAGNOSIS — I959 Hypotension, unspecified: Secondary | ICD-10-CM

## 2018-07-30 DIAGNOSIS — R634 Abnormal weight loss: Secondary | ICD-10-CM

## 2018-07-30 DIAGNOSIS — Z7189 Other specified counseling: Secondary | ICD-10-CM

## 2018-07-30 DIAGNOSIS — Z681 Body mass index (BMI) 19 or less, adult: Secondary | ICD-10-CM

## 2018-07-30 DIAGNOSIS — E875 Hyperkalemia: Secondary | ICD-10-CM

## 2018-07-30 DIAGNOSIS — L8990 Pressure ulcer of unspecified site, unspecified stage: Secondary | ICD-10-CM | POA: Insufficient documentation

## 2018-07-30 DIAGNOSIS — Z8615 Personal history of latent tuberculosis infection: Secondary | ICD-10-CM

## 2018-07-30 DIAGNOSIS — Z8701 Personal history of pneumonia (recurrent): Secondary | ICD-10-CM

## 2018-07-30 DIAGNOSIS — B20 Human immunodeficiency virus [HIV] disease: Secondary | ICD-10-CM

## 2018-07-30 DIAGNOSIS — B9561 Methicillin susceptible Staphylococcus aureus infection as the cause of diseases classified elsewhere: Secondary | ICD-10-CM

## 2018-07-30 DIAGNOSIS — Z8619 Personal history of other infectious and parasitic diseases: Secondary | ICD-10-CM

## 2018-07-30 DIAGNOSIS — R7881 Bacteremia: Secondary | ICD-10-CM

## 2018-07-30 DIAGNOSIS — Z515 Encounter for palliative care: Secondary | ICD-10-CM

## 2018-07-30 DIAGNOSIS — Z95828 Presence of other vascular implants and grafts: Secondary | ICD-10-CM

## 2018-07-30 DIAGNOSIS — D649 Anemia, unspecified: Secondary | ICD-10-CM

## 2018-07-30 DIAGNOSIS — Z87891 Personal history of nicotine dependence: Secondary | ICD-10-CM

## 2018-07-30 LAB — CBC
HCT: 19.8 % — ABNORMAL LOW (ref 39.0–52.0)
Hemoglobin: 6.6 g/dL — ABNORMAL LOW (ref 13.0–17.0)
MCH: 32.4 pg (ref 26.0–34.0)
MCHC: 33.3 g/dL (ref 30.0–36.0)
MCV: 97.1 fL (ref 80.0–100.0)
Platelets: 267 10*3/uL (ref 150–400)
RBC: 2.04 MIL/uL — ABNORMAL LOW (ref 4.22–5.81)
RDW: 14.6 % (ref 11.5–15.5)
WBC: 14.3 10*3/uL — ABNORMAL HIGH (ref 4.0–10.5)
nRBC: 0 % (ref 0.0–0.2)

## 2018-07-30 LAB — HEMOGLOBIN AND HEMATOCRIT, BLOOD
HCT: 19.7 % — ABNORMAL LOW (ref 39.0–52.0)
Hemoglobin: 6.6 g/dL — ABNORMAL LOW (ref 13.0–17.0)

## 2018-07-30 LAB — BLOOD CULTURE ID PANEL (REFLEXED)

## 2018-07-30 LAB — BASIC METABOLIC PANEL
Anion gap: 7 (ref 5–15)
BUN: 26 mg/dL — ABNORMAL HIGH (ref 6–20)
CO2: 22 mmol/L (ref 22–32)
Calcium: 7.7 mg/dL — ABNORMAL LOW (ref 8.9–10.3)
Chloride: 102 mmol/L (ref 98–111)
Creatinine, Ser: 1.46 mg/dL — ABNORMAL HIGH (ref 0.61–1.24)
GFR calc Af Amer: >60 mL/min (ref >60)
GFR calc non Af Amer: 56 mL/min — ABNORMAL LOW (ref >60)
Glucose, Bld: 89 mg/dL (ref 70–99)
Potassium: 4.7 mmol/L (ref 3.5–5.1)
Sodium: 131 mmol/L — ABNORMAL LOW (ref 135–145)

## 2018-07-30 LAB — ABO/RH: ABO/RH(D): O POS

## 2018-07-30 LAB — PREPARE RBC (CROSSMATCH)

## 2018-07-30 LAB — SEDIMENTATION RATE: Sed Rate: 126 mm/hr — ABNORMAL HIGH (ref 0–15)

## 2018-07-30 LAB — RETICULOCYTES
Immature Retic Fract: 17.8 % — ABNORMAL HIGH (ref 2.3–15.9)
RBC.: 1.82 MIL/uL — ABNORMAL LOW (ref 4.22–5.81)
Retic Count, Absolute: 41 10*3/uL (ref 19.0–186.0)
Retic Ct Pct: 2.3 % (ref 0.4–3.1)

## 2018-07-30 LAB — FERRITIN: Ferritin: 2203 ng/mL — ABNORMAL HIGH (ref 24–336)

## 2018-07-30 LAB — MAGNESIUM: Magnesium: 1.4 mg/dL — ABNORMAL LOW (ref 1.7–2.4)

## 2018-07-30 LAB — PHOSPHORUS: Phosphorus: 4.1 mg/dL (ref 2.5–4.6)

## 2018-07-30 MED ORDER — MAGNESIUM SULFATE 2 GM/50ML IV SOLN
2.0000 g | Freq: Once | INTRAVENOUS | Status: AC
  Administered 2018-07-30: 16:00:00 2 g via INTRAVENOUS
  Filled 2018-07-30: qty 50

## 2018-07-30 MED ORDER — SODIUM CHLORIDE 0.9% IV SOLUTION
Freq: Once | INTRAVENOUS | Status: AC
  Administered 2018-07-30: 16:00:00 via INTRAVENOUS

## 2018-07-30 MED ORDER — ENSURE ENLIVE PO LIQD
237.0000 mL | Freq: Three times a day (TID) | ORAL | Status: DC
  Administered 2018-07-30 – 2018-08-05 (×14): 237 mL via ORAL

## 2018-07-30 MED ORDER — SODIUM CHLORIDE 0.9 % IV BOLUS
500.0000 mL | Freq: Once | INTRAVENOUS | Status: AC
  Administered 2018-07-30: 03:00:00 500 mL via INTRAVENOUS

## 2018-07-30 MED ORDER — OCUVITE-LUTEIN PO CAPS
1.0000 | ORAL_CAPSULE | Freq: Every day | ORAL | Status: DC
  Administered 2018-07-31 – 2018-08-06 (×7): 1 via ORAL
  Filled 2018-07-30 (×7): qty 1

## 2018-07-30 MED ORDER — COLLAGENASE 250 UNIT/GM EX OINT
TOPICAL_OINTMENT | Freq: Every day | CUTANEOUS | Status: DC
  Administered 2018-07-30 – 2018-08-06 (×8): via TOPICAL
  Filled 2018-07-30: qty 30

## 2018-07-30 MED ORDER — CEFAZOLIN SODIUM-DEXTROSE 2-4 GM/100ML-% IV SOLN
2.0000 g | Freq: Three times a day (TID) | INTRAVENOUS | Status: DC
  Administered 2018-07-30 – 2018-08-06 (×23): 2 g via INTRAVENOUS
  Filled 2018-07-30 (×31): qty 100

## 2018-07-30 NOTE — Progress Notes (Addendum)
Telemetry called pts HR was up to 150 now running in mid 120s, BP 124/95 pt was having chills his temp was 100.5 orally... message paged [facility], NP order to give Tylenol 650mg 

## 2018-07-30 NOTE — Progress Notes (Signed)
PHARMACY - PHYSICIAN COMMUNICATION CRITICAL VALUE ALERT - BLOOD CULTURE IDENTIFICATION (BCID)  Joshua Dougherty is an 48 y.o. male who presented to [facility] on 07/29/2018 with a chief complaint of Weakness  Assessment:  4/4 bottles positive for Staph Species, Staph Aureus, mecA negative )  Name of physician (or Provider) Contacted: Dr. Stark Jock  Current antibiotics: Azithromycin,Rocephin  Changes to prescribed antibiotics recommended:  Recommendations accepted by provider. Will change antibiotics to Cefazolin 2g IV Q8hours  Results for orders placed or performed during the hospital encounter of 07/01/18  Blood Culture ID Panel (Reflexed) (Collected: 07/01/2018  3:09 AM)  Result Value Ref Range   Enterococcus species NOT DETECTED NOT DETECTED   Listeria monocytogenes NOT DETECTED NOT DETECTED   Staphylococcus species DETECTED (A) NOT DETECTED   Staphylococcus aureus (BCID) DETECTED (A) NOT DETECTED   Methicillin resistance NOT DETECTED NOT DETECTED   Streptococcus species DETECTED (A) NOT DETECTED   Streptococcus agalactiae DETECTED (A) NOT DETECTED   Streptococcus pneumoniae NOT DETECTED NOT DETECTED   Streptococcus pyogenes NOT DETECTED NOT DETECTED   Acinetobacter baumannii NOT DETECTED NOT DETECTED   Enterobacteriaceae species DETECTED (A) NOT DETECTED   Enterobacter cloacae complex NOT DETECTED NOT DETECTED   Escherichia coli DETECTED (A) NOT DETECTED   Klebsiella oxytoca NOT DETECTED NOT DETECTED   Klebsiella pneumoniae NOT DETECTED NOT DETECTED   Proteus species NOT DETECTED NOT DETECTED   Serratia marcescens NOT DETECTED NOT DETECTED   Carbapenem resistance NOT DETECTED NOT DETECTED   Haemophilus influenzae NOT DETECTED NOT DETECTED   Neisseria meningitidis NOT DETECTED NOT DETECTED   Pseudomonas aeruginosa NOT DETECTED NOT DETECTED   Candida albicans NOT DETECTED NOT DETECTED   Candida glabrata NOT DETECTED NOT DETECTED   Candida krusei NOT DETECTED NOT DETECTED   Candida  parapsilosis NOT DETECTED NOT DETECTED   Candida tropicalis NOT DETECTED NOT DETECTED    Ekta Dancer A Nocole Zammit 07/30/2018  8:21 AM

## 2018-07-30 NOTE — TOC Initial Note (Signed)
Transition of Care [facility]) - Initial/Assessment Note    Patient Details  Name: Joshua Dougherty MRN: [id number] Date of Birth: 01/14/1971  Transition of [facility]) CM/SW Contact:    Shelbie Hutching, RN Phone Number: 07/30/2018, 10:53 AM  Clinical Narrative:                  Patient admitted to the hospital yesterday for pneumonia.  Patient was recently discharged 6/18 to the hospice home.  At last discharge patient made his wishes known that he did not want treatment and was ready for end of life.  Patient has now decided that he no longer wants hospice care but wants treatment and to go home at discharge.  Patient reports he has a house in [place] and lives alone.  Patient does have a mother and daughter.  Patient would be agreeable to home health at discharge but he needs to be evaluated by PT and OT.  RNCM will cont to follow.   Expected Discharge Plan: [place] Barriers to Discharge: Continued Medical Work up   Patient Goals and CMS Choice Patient states their goals for this hospitalization and ongoing recovery are:: Patient reports he wants treatment, no longer wants to be hospice but he reports he doesn't think he need dialysis      Expected Discharge Plan and Services Expected Discharge Plan: [place]   Discharge Planning Services: CM Consult   Living arrangements for the past 2 months: [place], Hospice Facility(hospice facility for the past month before that home) Expected Discharge Date: 08/01/18                                    Prior Living Arrangements/Services Living arrangements for the past 2 months: [place], Hospice Facility(hospice facility for the past month before that home) Lives with:: Self Patient language and need for interpreter reviewed:: No Do you feel safe going back to the place where you live?: Yes      Need for Family Participation in Patient Care: Yes (Comment)(patient is chronically  ill) Care giver support system in place?: Yes (comment)(daughter and mother)   Criminal Activity/Legal Involvement Pertinent to Current Situation/Hospitalization: No - Comment as needed  Activities of Daily Living Home Assistive Devices/Equipment: None ADL Screening (condition at time of admission) Patient's cognitive ability adequate to safely complete daily activities?: Yes Is the patient deaf or have difficulty hearing?: No Does the patient have difficulty seeing, even when wearing glasses/contacts?: No Does the patient have difficulty concentrating, remembering, or making decisions?: No Patient able to express need for assistance with ADLs?: Yes Does the patient have difficulty dressing or bathing?: Yes Independently performs ADLs?: No Communication: Needs assistance Is this a change from baseline?: Pre-admission baseline Dressing (OT): Needs assistance Is this a change from baseline?: Pre-admission baseline Grooming: Independent Feeding: Independent Bathing: Needs assistance Is this a change from baseline?: Pre-admission baseline Toileting: Needs assistance Is this a change from baseline?: Pre-admission baseline In/Out Bed: Needs assistance Is this a change from baseline?: Pre-admission baseline Walks in Home: Needs assistance Is this a change from baseline?: Pre-admission baseline Does the patient have difficulty walking or climbing stairs?: Yes Weakness of Legs: Both Weakness of Arms/Hands: Both  Permission Sought/Granted Permission sought to share information with : Case Manager Permission granted to share information with : Yes, Verbal Permission Granted  Emotional Assessment Appearance:: Appears older than stated age Attitude/Demeanor/Rapport: Avoidant Affect (typically observed): Withdrawn Orientation: : Oriented to Self, Oriented to Place, Oriented to  Time, Oriented to Situation Alcohol / Substance Use: Not Applicable Psych Involvement: No  (comment)  Admission diagnosis:  Cachexia ([place]) [R64] Hyperkalemia [E87.5] Hyponatremia [E87.1] Weight loss [R63.4] Failure to thrive in adult [R62.7] Hypotension, unspecified hypotension type [I95.9] Patient Active Problem List   Diagnosis Date Noted  . Pneumonia 07/29/2018  . 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 CAP (community acquired pneumonia) 07/01/2018  . Protein-calorie malnutrition, severe 03/19/2018  . Hemoptysis 03/18/2018  . Tobacco abuse 05/20/2014  . Asymptomatic HIV infection ([place]) 05/20/2014   PCP:  Patient, No Pcp Per Pharmacy:   [facility] [address] ([address]), [place]) [place] [address] Phone: [phone] Fax: [place], [place] [address] Phone: [phone] Fax: [phone]     Social Determinants of Health (SDOH) Interventions    Readmission Risk Interventions No flowsheet data found.

## 2018-07-30 NOTE — Progress Notes (Signed)
Initial Nutrition Assessment  DOCUMENTATION CODES:   Severe malnutrition in context of chronic illness, Underweight  INTERVENTION:  Provide Ensure Enlive po TID, each supplement provides 350 kcal and 20 grams of protein. Patient prefers vanilla or strawberry.  Provide Ocuvite daily for wound healing (provides zinc, vitamin A, vitamin C, Vitamin E, copper, and selenium).  Monitor magnesium, potassium, and phosphorus daily for at least 3 days, MD to replete as needed, as pt is at risk for refeeding syndrome given severe malnutrition.  NUTRITION DIAGNOSIS:   Severe Malnutrition related to chronic illness(Tb, HIV) as evidenced by severe fat depletion, severe muscle depletion.  GOAL:   Patient will meet greater than or equal to 90% of their needs  MONITOR:   PO intake, Supplement acceptance, Labs, Weight trends, Skin, I & O's  REASON FOR ASSESSMENT:   Malnutrition Screening Tool    ASSESSMENT:   48 year old male with PMHx of Tb, HIV currently not on treatment who is admitted from hospice home with PNA, hyponatremia, AKI.   -Per chart patient had decided on comfort care and transferred to hospice home. Then decided he wanted to be full code and wanted treatment.  Met with patient at bedside. He is known to this RD from recent admission. He reports his appetite has not been good and he has not eaten well PTA. He was only taking bites/sips.  He was able to eat some breakfast this morning and tolerated it well. He is requesting Ensure.  Patient reports his UBW was 180 lbs. He reports he lost his weight a while ago. No weight history in chart prior to 02/2018. He is currently 68 kg (150 lbs) according to chart but that appears to be a reported weight and not a true measured weight.  Medications reviewed and include: NS at 100 mL/hr, cefazolin.  Labs reviewed: Sodium 131, BUN 26, Creatinine 1.46.  NUTRITION - FOCUSED PHYSICAL EXAM:    Most Recent Value  Orbital Region  Severe  depletion  Upper Arm Region  Severe depletion  Thoracic and Lumbar Region  Severe depletion  Buccal Region  Severe depletion  Temple Region  Severe depletion  Clavicle Bone Region  Severe depletion  Clavicle and Acromion Bone Region  Severe depletion  Scapular Bone Region  Severe depletion  Dorsal Hand  Severe depletion  Patellar Region  Severe depletion  Anterior Thigh Region  Severe depletion  Posterior Calf Region  Severe depletion  Edema (RD Assessment)  Mild  Hair  Reviewed  Eyes  Reviewed  Mouth  Reviewed  Skin  Reviewed  Nails  Reviewed     Diet Order:   Diet Order            Diet regular Room service appropriate? Yes; Fluid consistency: Thin  Diet effective now             EDUCATION NEEDS:   No education needs have been identified at this time  Skin:  Skin Assessment: Skin Integrity Issues:(stage III sacrum)  Last BM:  07/30/2018 - large type 6  Height:   Ht Readings from Last 1 Encounters:  07/29/18 '6\' 4"'$  (1.93 m)   Weight:   Wt Readings from Last 1 Encounters:  07/29/18 68 kg   Ideal Body Weight:  91.8 kg  BMI:  Body mass index is 18.26 kg/m.  Estimated Nutritional Needs:   Kcal:  2000-2200  Protein:  100-110 grams  Fluid:  2 L/day  Willey Blade, MS, RD, LDN Office: [phone] Pager: [phone] After Hours/Weekend  Pager: [phone]

## 2018-07-30 NOTE — Consult Note (Addendum)
NAME: Joshua Dougherty  DOB: [birth year]  MRN: [id number]  Date/Time: 07/30/2018 3:08 PM  REQUESTING PROVIDER: Dr. Stark Jock Subjective:  REASON FOR CONSULT: Staph bacteremia ? Joshua Dougherty is a 48 y.o. male with history of AIDS, cavitating lesion in the lung, recent polymicrobial bacteremia when he was in the hospital between 07/01/2018 to 07/10/2018 for resp failure, intubated , found to have rt sided pneumonia, polymicrobial bacteremia, AKI and was discharged  With hospice care as per his request. Yesterday patient presented to the emergency department after revoking hospice care and wanted to get full treatment. He was concerned that he was losing weight and was not getting any medication. In the ED his vitals were BP of 85/67, heart rate of 140, respiratory rate of 24 and temperature of 98.2. His labs showed a sodium of 130, potassium of 6, creatinine of 1.56, Alkaline phosphatase of 250, AST 49 and total bilirubin of 1.3.  He had a sodium of 127, hemoglobin of 10.2 and WBC of 9.6.  Platelet was 389.  Blood cultures were sent and he was started on IV antibiotics Blood cultures positive for staph aureus identified by [facility] ID.  And I am seeing the patient for the same  Patient still has the left femoral catheter which was placed during his last admission for dialysis.  It was not removed on discharge. Patient states that he was told that he had 1-lead 2 weeks but he saw that he was getting better in the past 2 weeks and thought it was a miracle and wanted to get back into care to start treatment for HIV. He denies any fever, chills, cough, shortness of breath, pain abdomen, nausea or vomiting.  His appetite is poor.  He has lost significant weight over the past few months.  He says his dialysis catheter on the left groin hurts.  He has been in hospice care for the past 3 weeks. His last CD4 in June was 45 and his viral load was more than 1 million copies.  He has not been on treatment for many years. He says he  had a positive PPD when he was in the prison in [place] and was treated for it.  He is not sure whether he was treated for latent TB or full-blown TB.  But he did say that it was after the skin test that came back positive they treated him in the prison system. Patient the beginning of the year was tested for pulmonary tuberculosis with 3 sputum's and it was negative.  He also had a bronchoscopy and that test was also negative.  Past Medical History:  Diagnosis Date  . HIV (human immunodeficiency virus infection) ([place])   . Renal disorder   . Tuberculosis   Latent TB Gout Past Surgical History:  Procedure Laterality Date  . ABSCESS DRAINAGE  2013   chest wall; patient states it was not contiguous with his lung however this was an operation under general anesthesia  . FLEXIBLE BRONCHOSCOPY Right 03/28/2018   Procedure: FLEXIBLE BRONCHOSCOPY, RIGHT;  Surgeon: Laverle Hobby, MD;  Location: [facility] ORS;  Service: Pulmonary;  Laterality: Right;     Social history Former smoker No alcohol recently or illicit drug use  History reviewed. No pertinent family history. No Known Allergies  ? Current Facility-Administered Medications  Medication Dose Route Frequency Provider Last Rate Last Dose  . 0.9 %  sodium chloride infusion   Intravenous Continuous Demetrios Loll, MD   Stopped at 07/30/18 [phone]  . acetaminophen (TYLENOL) tablet  650 mg  650 mg Oral Q6H PRN Demetrios Loll, MD   650 mg at 07/30/18 0056   Or  . acetaminophen (TYLENOL) suppository 650 mg  650 mg Rectal Q6H PRN Demetrios Loll, MD      . albuterol (PROVENTIL) (2.5 MG/3ML) 0.083% nebulizer solution 2.5 mg  2.5 mg Nebulization Q2H PRN Demetrios Loll, MD      . bisacodyl (DULCOLAX) EC tablet 5 mg  5 mg Oral Daily PRN Demetrios Loll, MD      . ceFAZolin (ANCEF) IVPB 2g/100 mL premix  2 g Intravenous Q8H Ojie, Jude, MD 200 mL/hr at 07/30/18 1000    . collagenase (SANTYL) ointment   Topical Daily Ojie, Jude, MD      . feeding supplement (ENSURE ENLIVE)  (ENSURE ENLIVE) liquid 237 mL  237 mL Oral TID BM Ojie, Jude, MD      . guaiFENesin (ROBITUSSIN) 100 MG/5ML solution 100 mg  5 mL Oral Q4H PRN Demetrios Loll, MD      . heparin injection 5,000 Units  5,000 Units Subcutaneous Q8H Demetrios Loll, MD   5,000 Units at 07/30/18 0600  . HYDROcodone-acetaminophen (NORCO/VICODIN) 5-325 MG per tablet 1-2 tablet  1-2 tablet Oral Q4H PRN Demetrios Loll, MD      . Derrill Memo ON 07/31/2018] multivitamin-lutein (OCUVITE-LUTEIN) capsule 1 capsule  1 capsule Oral Daily Ojie, Jude, MD      . ondansetron (ZOFRAN) tablet 4 mg  4 mg Oral Q6H PRN Demetrios Loll, MD       Or  . ondansetron [facility]) injection 4 mg  4 mg Intravenous Q6H PRN Demetrios Loll, MD      . senna-docusate (Senokot-S) tablet 1 tablet  1 tablet Oral QHS PRN Demetrios Loll, MD         Abtx:  Anti-infectives (From admission, onward)   Start     Dose/Rate Route Frequency Ordered Stop   07/30/18 1000  azithromycin (ZITHROMAX) tablet 500 mg  Status:  Discontinued     500 mg Oral Daily 07/29/18 2136 07/30/18 0820   07/30/18 0830  ceFAZolin (ANCEF) IVPB 2g/100 mL premix     2 g 200 mL/hr over 30 Minutes Intravenous Every 8 hours 07/30/18 0820     07/30/18 0800  cefTRIAXone (ROCEPHIN) 2 g in sodium chloride 0.9 % 100 mL IVPB  Status:  Discontinued     2 g 200 mL/hr over 30 Minutes Intravenous Every 24 hours 07/29/18 2136 07/30/18 0820   07/29/18 1845  ceFEPIme (MAXIPIME) 1 g in sodium chloride 0.9 % 100 mL IVPB     1 g 200 mL/hr over 30 Minutes Intravenous  Once 07/29/18 1834 07/29/18 2018   07/29/18 1845  azithromycin (ZITHROMAX) 500 mg in sodium chloride 0.9 % 250 mL IVPB     500 mg 250 mL/hr over 60 Minutes Intravenous  Once 07/29/18 1834 07/29/18 2018      REVIEW OF SYSTEMS:  Const: negative fever, negative chills, positive weight loss Eyes: negative diplopia or visual changes, negative eye pain ENT: negative coryza, negative sore throat Resp: negative cough, hemoptysis, dyspnea Cards: negative for chest pain,  palpitations, lower extremity edema GU: negative for frequency, dysuria and hematuria GI: Negative for abdominal pain, diarrhea, bleeding, constipation Skin: negative for rash and pruritus Heme: negative for easy bruising and gum/nose bleeding MS: Has has generalized muscle weakness and fatigue Neurolo:negative for headaches, dizziness, vertigo, memory problems  Psych: negative for feelings of anxiety, depression  Endocrine: negative for thyroid, diabetes Allergy/Immunology- negative for any medication or  food allergies ?  Objective:  VITALS:  BP 91/63 (BP Location: Right Arm)   Pulse 98   Temp 98.4 F (36.9 C) (Oral)   Resp 18   Ht _0  (1.93 m)   Wt 68 kg   SpO2 90%   BMI 18.26 kg/m  PHYSICAL EXAM:  General: Alert, cooperative, no distress, emaciated head: Normocephalic, without obvious abnormality, atraumatic. Eyes: Conjunctivae clear, anicteric sclerae. Pupils are equal ENT Nares normal. No drainage or sinus tenderness. Lips, mucosa, and tongue normal. No Thrush Neck: Supple, symmetrical, no adenopathy, thyroid: non tender no carotid bruit and no JVD. Back: No CVA tenderness. Lungs: Clear to auscultation bilaterally. No Wheezing or Rhonchi. No rales. Heart: S1-S2 tachycardia abdomen: Soft, non-tender,not distended. Bowel sounds normal. No masses Extremities: atraumatic, no cyanosis. No edema. No clubbing, left femoral catheter Skin: No rashes or lesions. Or bruising Lymph: Cervical, supraclavicular normal. Neurologic: Grossly non-focal Pertinent Labs Lab Results CBC    Component Value Date/Time   WBC 14.3 (H) 07/30/2018 0527   RBC 2.04 (L) 07/30/2018 0527   HGB 6.6 (L) 07/30/2018 1331   HGB 12.4 (L) 07/02/2018 1151   HCT 19.7 (L) 07/30/2018 1331   HCT 33.7 (L) 07/02/2018 1151   PLT 267 07/30/2018 0527   PLT 114 (L) 07/02/2018 1151   MCV 97.1 07/30/2018 0527   MCV 92 07/02/2018 1151   MCH 32.4 07/30/2018 0527   MCHC 33.3 07/30/2018 0527   RDW 14.6 07/30/2018  0527   RDW 12.5 07/02/2018 1151   LYMPHSABS 3.2 07/29/2018 1600   LYMPHSABS 0.5 (L) 07/02/2018 1151   MONOABS 0.7 07/29/2018 1600   EOSABS 0.0 07/29/2018 1600   EOSABS 0.0 07/02/2018 1151   BASOSABS 0.0 07/29/2018 1600   BASOSABS 0.0 07/02/2018 1151    CMP Latest Ref Rng & Units 07/30/2018 07/29/2018 07/29/2018  Glucose 70 - 99 mg/dL 89 111(H) 108(H)  BUN 6 - 20 mg/dL 26(H) 28(H) 27(H)  Creatinine 0.61 - 1.24 mg/dL 1.46(H) 1.61(H) 1.56(H)  Sodium 135 - 145 mmol/L 131(L) 127(L) 130(L)  Potassium 3.5 - 5.1 mmol/L 4.7 5.5(H) 6.0(H)  Chloride 98 - 111 mmol/L 102 94(L) 92(L)  CO2 22 - 32 mmol/L _1 Calcium 8.9 - 10.3 mg/dL 7.7(L) 8.4(L) 8.9  Total Protein 6.5 - 8.1 g/dL - - 8.7(H)  Total Bilirubin 0.3 - 1.2 mg/dL - - 1.3(H)  Alkaline Phos 38 - 126 U/L - - 250(H)  AST 15 - 41 U/L - - 49(H)  ALT 0 - 44 U/L - - 27      Microbiology: Recent Results (from the past 240 hour(s))  Culture, blood (routine x 2)     Status: None (Preliminary result)   Collection Time: 07/29/18  3:50 PM   Specimen: BLOOD  Result Value Ref Range Status   Specimen Description BLOOD RIGHT ARM  Final   Special Requests   Final    BOTTLES DRAWN AEROBIC AND ANAEROBIC Blood Culture adequate volume   Culture  Setup Time   Final    GRAM POSITIVE COCCI IN BOTH AEROBIC AND ANAEROBIC BOTTLES CRITICAL RESULT CALLED TO, READ BACK BY AND VERIFIED WITH: CHARLES SHANLEVER AT 0804 ON 07/30/2018 [place]. Performed at [facility], [address] [place], [place] [address]    Culture [facility] POSITIVE COCCI  Final   Report Status PENDING  Incomplete  Culture, blood (routine x 2)     Status: None (Preliminary result)   Collection Time: 07/29/18  3:50 PM   Specimen: BLOOD  Result Value Ref Range Status   Specimen Description   Final    BLOOD LEFT ARM Performed at [facility], [address] [place], [place] [address]    Special Requests   Final    BOTTLES DRAWN AEROBIC AND ANAEROBIC Blood Culture  adequate volume Performed at [facility], [address] [place], [place] [address]    Culture  Setup Time   Final    GRAM POSITIVE COCCI IN BOTH AEROBIC AND ANAEROBIC BOTTLES CRITICAL RESULT CALLED TO, READ BACK BY AND VERIFIED WITH: CHARLES SHANLEVER AT 0804 ON 07/30/2018 [place]. Performed at [facility], [place] [address] [place], [place] [address]    Culture GRAM POSITIVE COCCI  Final   Report Status PENDING  Incomplete  Blood Culture ID Panel (Reflexed)     Status: Abnormal   Collection Time: 07/29/18  3:50 PM  Result Value Ref Range Status   Enterococcus species NOT DETECTED NOT DETECTED Final   Listeria monocytogenes NOT DETECTED NOT DETECTED Final   Staphylococcus species DETECTED (A) NOT DETECTED Final    Comment: CRITICAL RESULT CALLED TO, READ BACK BY AND VERIFIED WITH: CHARLES SHANLEVER AT 0804 ON 07/30/2018 [place].    Staphylococcus aureus (BCID) DETECTED (A) NOT DETECTED Final    Comment: Methicillin (oxacillin) susceptible Staphylococcus aureus (MSSA). Preferred therapy is anti staphylococcal beta lactam antibiotic (Cefazolin or Nafcillin), unless clinically contraindicated. CRITICAL RESULT CALLED TO, READ BACK BY AND VERIFIED WITH: CHARLES SHANLEVER AT 0804 ON 07/30/2018 [place].    Methicillin resistance NOT DETECTED NOT DETECTED Final   Streptococcus species NOT DETECTED NOT DETECTED Final   Streptococcus agalactiae NOT DETECTED NOT DETECTED Final   Streptococcus pneumoniae NOT DETECTED NOT DETECTED Final   Streptococcus pyogenes NOT DETECTED NOT DETECTED Final   Acinetobacter baumannii NOT DETECTED NOT DETECTED Final   Enterobacteriaceae species NOT DETECTED NOT DETECTED Final   Enterobacter cloacae complex NOT DETECTED NOT DETECTED Final   Escherichia coli NOT DETECTED NOT DETECTED Final   Klebsiella oxytoca NOT DETECTED NOT DETECTED Final   Klebsiella pneumoniae NOT DETECTED NOT DETECTED Final   Proteus species NOT DETECTED NOT DETECTED Final   Serratia  marcescens NOT DETECTED NOT DETECTED Final   Haemophilus influenzae NOT DETECTED NOT DETECTED Final   Neisseria meningitidis NOT DETECTED NOT DETECTED Final   Pseudomonas aeruginosa NOT DETECTED NOT DETECTED Final   Candida albicans NOT DETECTED NOT DETECTED Final   Candida glabrata NOT DETECTED NOT DETECTED Final   Candida krusei NOT DETECTED NOT DETECTED Final   Candida parapsilosis NOT DETECTED NOT DETECTED Final   Candida tropicalis NOT DETECTED NOT DETECTED Final    Comment: Performed at [facility], [address] [place], [place] [address]  SARS Coronavirus 2 (CEPHEID - Performed in [place] hospital lab), Hosp Order     Status: None   Collection Time: 07/29/18  6:59 PM   Specimen: Nasopharyngeal Swab  Result Value Ref Range Status   SARS Coronavirus 2 NEGATIVE NEGATIVE Final    Comment: (NOTE) If result is NEGATIVE SARS-CoV-2 target nucleic acids are NOT DETECTED. The SARS-CoV-2 RNA is generally detectable in upper and lower  respiratory specimens during the acute phase of infection. The lowest  concentration of SARS-CoV-2 viral copies this assay can detect is 250  copies / mL. A negative result does not preclude SARS-CoV-2 infection  and should not be used as the sole basis for treatment or other  patient management decisions.  A negative result may occur with  improper  specimen collection / handling, submission of specimen other  than nasopharyngeal swab, presence of viral mutation(s) within the  areas targeted by this assay, and inadequate number of viral copies  (<250 copies / mL). A negative result must be combined with clinical  observations, patient history, and epidemiological information. If result is POSITIVE SARS-CoV-2 target nucleic acids are DETECTED. The SARS-CoV-2 RNA is generally detectable in upper and lower  respiratory specimens dur ing the acute phase of infection.  Positive  results are indicative of active infection with SARS-CoV-2.   Clinical  correlation with patient history and other diagnostic information is  necessary to determine patient infection status.  Positive results do  not rule out bacterial infection or co-infection with other viruses. If result is PRESUMPTIVE POSTIVE SARS-CoV-2 nucleic acids MAY BE PRESENT.   A presumptive positive result was obtained on the submitted specimen  and confirmed on repeat testing.  While 2019 novel coronavirus  (SARS-CoV-2) nucleic acids may be present in the submitted sample  additional confirmatory testing may be necessary for epidemiological  and / or clinical management purposes  to differentiate between  SARS-CoV-2 and other Sarbecovirus currently known to infect humans.  If clinically indicated additional testing with an alternate test  methodology [phone]) is advised. The SARS-CoV-2 RNA is generally  detectable in upper and lower respiratory sp ecimens during the acute  phase of infection. The expected result is Negative. Fact Sheet for Patients:  StrictlyIdeas.no Fact Sheet for Healthcare Providers: BankingDealers.co.za This test is not yet approved or cleared by the [place] FDA and has been authorized for detection and/or diagnosis of SARS-CoV-2 by FDA under an Emergency Use Authorization (EUA).  This EUA will remain in effect (meaning this test can be used) for the duration of the COVID-19 declaration under Section 564(b)(1) of the Act, 21 U.S.C. section 360bbb-3(b)(1), unless the authorization is terminated or revoked sooner. Performed at [facility], [address] [place], [place] [address]     IMAGING RESULTS:  I have personally reviewed the films ? Impression/Recommendation ? Staph aureus bacteremia:  could be the femoral dialysis catheter which has been left behind for the past 4 weeks or could be untreated staph aureus bacteremia from the prior admission before the catheter was  placed. This is being removed  will need to get a TEE, may need imaging of the spine.  Currently on cefazolin.  Advanced HIV/AIDS with CD4 of 45 and viral load of more than 1 million copies.  Patient is interested in starting treatment.  He does not have insurance.  So we will need to get him connected to community center in [place].  But will start him on antiretroviral during this hospitalization.  Will send genotype. PCP prophylaxis with atovaquone  Weight loss and failure to thrive: With low sodium and high potassium on admission we need to rule out adrenal insufficiency which could be secondary to HIV. We will get a morning serum cortisol level.  Anemia need to rule out Mycobacterium avium.  We will send blood cultures for MAI ? 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

## 2018-07-30 NOTE — Progress Notes (Signed)
[place] [facility] [place] NAME: Joshua Dougherty    MR#:  [id number]  DATE OF BIRTH:  1970/05/27  SUBJECTIVE:  CHIEF COMPLAINT:   Chief Complaint  Patient presents with  . Chronic Renal Failure   No new complaints.  Maximum temperature 100.5 last night.  Patient resting comfortably. REVIEW OF SYSTEMS:  Review of Systems  Constitutional: Positive for weight loss. Negative for chills and fever.  HENT: Negative for hearing loss and tinnitus.   Eyes: Negative for blurred vision and double vision.  Respiratory: Negative for cough and shortness of breath.   Cardiovascular: Negative for chest pain and palpitations.  Gastrointestinal: Negative for heartburn, nausea and vomiting.  Genitourinary: Negative for dysuria.  Musculoskeletal: Negative for myalgias and neck pain.  Skin: Negative for itching and rash.  Neurological: Negative for dizziness and headaches.  Psychiatric/Behavioral: Negative for depression and hallucinations.    DRUG ALLERGIES:  No Known Allergies VITALS:  Blood pressure 91/63, pulse 98, temperature 98.4 F (36.9 C), temperature source Oral, resp. rate 18, height 6\' 4"  (1.93 m), weight 68 kg, SpO2 90 %. PHYSICAL EXAMINATION:   Physical Exam  Constitutional: He is oriented to person, place, and time. He appears well-developed.  Thin  HENT:  Head: Normocephalic and atraumatic.  Right Ear: External ear normal.  Eyes: Pupils are equal, round, and reactive to light. Conjunctivae are normal. Right eye exhibits no discharge.  Neck: Normal range of motion.  Cardiovascular: Normal rate, regular rhythm and normal heart sounds.  Respiratory: Effort normal. He has rales.  GI: Soft. Bowel sounds are normal. He exhibits no distension.  Musculoskeletal: Normal range of motion.        General: No edema.  Neurological: He is alert and oriented to person, place, and time. No cranial nerve deficit.  Skin: Skin is warm. He is not diaphoretic.  No erythema.  Psychiatric: He has a normal mood and affect. His behavior is normal.     LABORATORY PANEL:  Male CBC Recent Labs  Lab 07/30/18 0527  WBC 14.3*  HGB 6.6*  HCT 19.8*  PLT 267   ------------------------------------------------------------------------------------------------------------------ Chemistries  Recent Labs  Lab 07/29/18 1600  07/30/18 0527  NA 130*   < > 131*  K 6.0*   < > 4.7  CL 92*   < > 102  CO2 25   < > 22  GLUCOSE 108*   < > 89  BUN 27*   < > 26*  CREATININE 1.56*   < > 1.46*  CALCIUM 8.9   < > 7.7*  AST 49*  --   --   ALT 27  --   --   ALKPHOS 250*  --   --   BILITOT 1.3*  --   --    < > = values in this interval not displayed.   RADIOLOGY:  Dg Chest Portable 1 View  Result Date: 07/29/2018 CLINICAL DATA:  Recent pneumonia.  HIV disease. EXAM: PORTABLE CHEST 1 VIEW COMPARISON:  July 07, 2018 FINDINGS: Endotracheal tube and nasogastric tube have been removed. No pneumothorax. There is patchy airspace opacity in the right lower lobe with small right pleural effusion. Lungs elsewhere are clear. Heart size and pulmonary vascularity are normal. No adenopathy. No bone lesions. IMPRESSION: Patchy infiltrate right base with small right pleural effusion. There is less consolidation in this area compared to most recent study. Lungs elsewhere clear. Heart size normal. No evident adenopathy. Electronically Signed   By: Gwyndolyn Saxon  Jasmine December III M.D.   On: 07/29/2018 16:24   ASSESSMENT AND PLAN:   1. Right lower lobe neumonia with small pleural effusion Patient placed empirically on IV antibiotics initially with cefepime and azithromycin.  MSSA bacteremia ;blood cultures growing staph aureus.  Antibiotics later changed to cefazolin based on cultures and discussion with pharmacy. IV fluid hydration.  PRN Robitussin.  Monitor clinically.  2.  Hyponatremia; sodium level improved from 1 27-1 31.  3.  Hyperkalemia with potassium of 6.0 Resolved.  4.  Acute  kidney injury Renal ultrasound consistent with HIV-associated nephropathy.  Nephrology following.  No indication for hemodialysis at this time.   5.  History of HIV.   Recent CD4 count of 403  Will need follow-up with ID if patient wishes to resume treatment.  Patient was recently discharged from the hospital to hospice house but brought back to the hospital and now full code.  6. Anemia of chronic disease. Noted drop in hemoglobin to 6.6 this morning from 10.2 last night. Clinically no evidence of bleeding. Requested for repeat hemoglobin and if still low will transfuse with packed red blood cells.  7.  Unstageable sacral pressure injury. Present on admission.  Wound care following.  DVT prophylaxis; heparin  All the records are reviewed and case discussed with Care Management/Social Worker. Management plans discussed with the patient, family and they are in agreement.  CODE STATUS: Full Code  TOTAL TIME TAKING CARE OF THIS PATIENT: 35 minutes.   More than 50% of the time was spent in counseling/coordination of care: YES  POSSIBLE D/C IN 2 DAYS, DEPENDING ON CLINICAL CONDITION.   Chevi Lim M.D on 07/30/2018 at 12:12 PM  Between 7am to 6pm - Pager - [phone]  After 6pm go to [email or web address] - Proofreader  [facility]  Office  [phone]  CC: Primary care physician; Patient, No Pcp Per  Note: This dictation was prepared with Dragon dictation along with smaller phrase technology. Any transcriptional errors that result from this process are unintentional.

## 2018-07-30 NOTE — Progress Notes (Signed)
Messaged Seals, NP to make her aware that pt had a MEWS score of 4. Pt also still had HR in high teens to upper 120s and he got up his HR would jump up to 160s. Pts BP was 82/56, temp 99.3. New order for NS 500 cc bolus.

## 2018-07-30 NOTE — Consult Note (Addendum)
Consultation Note Date: 07/30/2018   Patient Name: Joshua Dougherty  DOB: 11-14-1970  MRN: [id number]  Age / Sex: 48 y.o., male  PCP: Patient, No Pcp Per Referring Physician: Otila Back, MD  Reason for Consultation: Establishing goals of care  HPI/Patient Profile: 48 y/o who left hospice home and to come to ED for treatment.  The patient denies any fever or chills, no cough or sputum or chest pain.  He was found hyponatremia and hyperkalemia.  Chest x-ray reported right-sided pneumonia.  Clinical Assessment and Goals of Care: Patient is resting in bed. He was transferred here as he improved during his time in hospice facility, and wanted to come to the hospital for eval by infectious disease and other specialties as needed before D/C home. He was noted to have PNA which he is currently receiving treatment for. He no longer has an indication for dialysis.    Discussed GOC. He discussed his experiences while in hospice. He states he believes God has left him here for a reason. He wants all care possible including full code status.   Will continue to follow.     SUMMARY OF RECOMMENDATIONS   Full  Code/full scope.  Prognosis:   Unable to determine  Discharge Planning: To Be Determined      Primary Diagnoses: Present on Admission: . Pneumonia   I have reviewed the medical record, interviewed the patient and family, and examined the patient. The following aspects are pertinent.  Past Medical History:  Diagnosis Date  . HIV (human immunodeficiency virus infection) ([place])   . Renal disorder   . Tuberculosis    Social History   Socioeconomic History  . Marital status: Single    Spouse name: Not on file  . Number of children: Not on file  . Years of education: Not on file  . Highest education level: Not on file  Occupational History  . Not on file  Social Needs  . Financial resource strain: Not  hard at all  . Food insecurity    Worry: Never true    Inability: Never true  . Transportation needs    Medical: No    Non-medical: No  Tobacco Use  . Smoking status: Former Smoker    Packs/day: 0.25    Years: 20.00    Pack years: 5.00  . Smokeless tobacco: Never Used  . Tobacco comment: per pt has not smoked in last 30 days  Substance and Sexual Activity  . Alcohol use: Yes    Alcohol/week: 2.0 - 3.0 standard drinks    Types: 2 - 3 Standard drinks or equivalent per week    Frequency: Never  . Drug use: No  . Sexual activity: Not on file  Lifestyle  . Physical activity    Days per week: 5 days    Minutes per session: Not on file  . Stress: Not at all  Relationships  . Social connections    Talks on phone: More than three times a week    Gets together: More than  three times a week    Attends religious service: Never    Active member of club or organization: No    Attends meetings of clubs or organizations: Never    Relationship status: Not on file  Other Topics Concern  . Not on file  Social History Narrative  . Not on file   History reviewed. No pertinent family history. Scheduled Meds: . collagenase   Topical Daily  . feeding supplement (ENSURE ENLIVE)  237 mL Oral TID BM  . heparin  5,000 Units Subcutaneous Q8H  . [START ON 07/31/2018] multivitamin-lutein  1 capsule Oral Daily   Continuous Infusions: . sodium chloride Stopped (07/30/18 0944)  .  ceFAZolin (ANCEF) IV 200 mL/hr at 07/30/18 1000   PRN Meds:.acetaminophen **OR** acetaminophen, albuterol, bisacodyl, guaiFENesin, HYDROcodone-acetaminophen, ondansetron **OR** ondansetron (ZOFRAN) IV, senna-docusate Medications Prior to Admission:  Prior to Admission medications   Not on File   No Known Allergies Review of Systems  All other systems reviewed and are negative.   Physical Exam HENT:     Head: Normocephalic.  Neurological:     Mental Status: He is alert.     Vital Signs: BP 91/63 (BP Location:  Right Arm)   Pulse 98   Temp 98.4 F (36.9 C) (Oral)   Resp 18   Ht 6\' 4"  (1.93 m)   Wt 68 kg   SpO2 90%   BMI 18.26 kg/m  Pain Scale: 0-10   Pain Score: 0-No pain   SpO2: SpO2: 90 % O2 Device:SpO2: 90 % O2 Flow Rate: .   IO: Intake/output summary:   Intake/Output Summary (Last 24 hours) at 07/30/2018 1535 Last data filed at 07/30/2018 1230 Gross per 24 hour  Intake 3583.7 ml  Output 500 ml  Net 3083.7 ml    LBM: Last BM Date: 07/27/18 Baseline Weight: Weight: 68 kg Most recent weight: Weight: 68 kg     Palliative Assessment/Data:     Time In: 3:00 Time Out: 3:40 Time Total: 40 min Greater than 50%  of this time was spent counseling and coordinating care related to the above assessment and plan.  Signed by: Asencion Gowda, NP   Please contact Palliative Medicine Team phone at [phone] for questions and concerns.  For individual provider: See Shea Evans

## 2018-07-30 NOTE — Consult Note (Signed)
[place] Nurse wound consult note Patient receiving care in [id number] Reason for Consult: sacral wound Wound type: UNSTAGEABLE Pressure Injury POA: Yes Measurement: 9 cm x 7 cm Wound bed:90% pink, 10% yellow slough Drainage (amount, consistency, odor) minimal on existing foam dressing. No odor Periwound: intact Dressing procedure/placement/frequency: Apply Santyl to the coccyx wound in a nickel thick layer. Cover with a saline moistened gauze, then dry gauze or ABD pad.  Change daily. Monitor the wound area(s) for worsening of condition such as: Signs/symptoms of infection,  Increase in size,  Development of or worsening of odor, Development of pain, or increased pain at the affected locations.  Notify the medical team if any of these develop.  Thank you for the consult.  Discussed plan of care with the patient and bedside nurse.  [place] nurse will not follow at this time.  Please re-consult the [place] team if needed.  Val Riles, RN, MSN, CWOCN, CNS-BC, pager [phone]

## 2018-07-30 NOTE — Progress Notes (Signed)
[facility]  ROUNDING NOTE   Subjective:   [name] admitted to [facility] on 07/29/2018 for Cachexia [facility]) [R64] Hyperkalemia [E87.5] Hyponatremia [E87.1] Weight loss [R63.4] Failure to thrive in adult [R62.7] Hypotension, unspecified hypotension type [I95.9]  Patient was discharged from [facility] on 6/9-6/18 where patient was started on hemodialysis. He decided that he did not want dialysis and was transferred to Hospice. Patient states that hospice was not doing anything for him so he left. Patient presents to the ED yesterday with shortness of breath. Found to have right side pneumonia. Started on antibiotics. Tmax of 100.5.   Patient with hyponatremia and hyperkalemia which have been medically treated. Currently on NS infusion   Last hemodialysis treatment was 6/16  Objective:  Vital signs in last 24 hours:  Temp:  [98.2 F (36.8 C)-100.5 F (38.1 C)] 98.4 F (36.9 C) (07/08 0606) Pulse Rate:  [98-140] 98 (07/08 0606) Resp:  [18-26] 18 (07/08 0606) BP: (82-139)/(56-97) 91/63 (07/08 0606) SpO2:  [90 %-100 %] 90 % (07/08 0606) Weight:  [68 kg] 68 kg (07/07 1519)  Weight change:  Filed Weights   07/29/18 1519  Weight: 68 kg    Intake/Output: I/O last 3 completed shifts: In: 2350 [IV Piggyback:2350] Out: 150 [Urine:150]   Intake/Output this shift:  Total I/O In: 1233.7 [I.V.:1181.5; IV Piggyback:52.2] Out: 200 [Urine:200]  Physical Exam: General: cachectic  Head: +temporal wasting  Eyes: Anicteric  Neck:   trachea midline  Lungs:  Basilar crackles  Heart: regular  Abdomen:  Soft, bowel sounds present  Extremities: no peripheral edema.  Neurologic: Sedated, intubated  Skin: No lesions  Access: Left femoral temporary dialysis catheter    Basic Metabolic Panel: Recent Labs  Lab 07/29/18 1600 07/29/18 1731 07/30/18 0527  NA 130* 127* 131*  K 6.0* 5.5* 4.7  CL 92* 94* 102  CO2 25 24 22   GLUCOSE 108* 111* 89  BUN 27* 28* 26*  CREATININE  1.56* 1.61* 1.46*  CALCIUM 8.9 8.4* 7.7*    Liver Function Tests: Recent Labs  Lab 07/29/18 1600  AST 49*  ALT 27  ALKPHOS 250*  BILITOT 1.3*  PROT 8.7*  ALBUMIN 2.8*   Recent Labs  Lab 07/29/18 1600  LIPASE 24   No results for input(s): AMMONIA in the last 168 hours.  CBC: Recent Labs  Lab 07/29/18 1600 07/30/18 0527  WBC 9.6 14.3*  NEUTROABS 5.5  --   HGB 10.2* 6.6*  HCT 30.3* 19.8*  MCV 95.6 97.1  PLT 389 267    Cardiac Enzymes: No results for input(s): CKTOTAL, CKMB, CKMBINDEX, TROPONINI in the last 168 hours.  BNP: Invalid input(s): POCBNP  CBG: No results for input(s): GLUCAP in the last 168 hours.  Microbiology: Results for orders placed or performed during the hospital encounter of 07/29/18  Culture, blood (routine x 2)     Status: None (Preliminary result)   Collection Time: 07/29/18  3:50 PM   Specimen: BLOOD  Result Value Ref Range Status   Specimen Description BLOOD RIGHT ARM  Final   Special Requests   Final    BOTTLES DRAWN AEROBIC AND ANAEROBIC Blood Culture adequate volume   Culture  Setup Time   Final    GRAM POSITIVE COCCI IN BOTH AEROBIC AND ANAEROBIC BOTTLES CRITICAL RESULT CALLED TO, READ BACK BY AND VERIFIED WITH: CHARLES SHANLEVER AT 0804 ON 07/30/2018 [place]. Performed at [facility], [address], [place], [place] [address]    [place]  Final  Report Status PENDING  Incomplete  Culture, blood (routine x 2)     Status: None (Preliminary result)   Collection Time: 07/29/18  3:50 PM   Specimen: BLOOD  Result Value Ref Range Status   Specimen Description BLOOD LEFT ARM  Final   Special Requests   Final    BOTTLES DRAWN AEROBIC AND ANAEROBIC Blood Culture adequate volume   Culture  Setup Time   Final    Organism ID to follow GRAM POSITIVE COCCI IN BOTH AEROBIC AND ANAEROBIC BOTTLES CRITICAL RESULT CALLED TO, READ BACK BY AND VERIFIED WITH: CHARLES SHANLEVER AT 0804 ON 07/30/2018 [place]. Performed at  [facility], [address] [place], [place] [address]    Culture [facility] POSITIVE COCCI  Final   Report Status PENDING  Incomplete  Blood Culture ID Panel (Reflexed)     Status: Abnormal   Collection Time: 07/29/18  3:50 PM  Result Value Ref Range Status   Enterococcus species NOT DETECTED NOT DETECTED Final   Listeria monocytogenes NOT DETECTED NOT DETECTED Final   Staphylococcus species DETECTED (A) NOT DETECTED Final    Comment: CRITICAL RESULT CALLED TO, READ BACK BY AND VERIFIED WITH: CHARLES SHANLEVER AT 0804 ON 07/30/2018 [place].    Staphylococcus aureus (BCID) DETECTED (A) NOT DETECTED Final    Comment: Methicillin (oxacillin) susceptible Staphylococcus aureus (MSSA). Preferred therapy is anti staphylococcal beta lactam antibiotic (Cefazolin or Nafcillin), unless clinically contraindicated. CRITICAL RESULT CALLED TO, READ BACK BY AND VERIFIED WITH: CHARLES SHANLEVER AT 0804 ON 07/30/2018 [place].    Methicillin resistance NOT DETECTED NOT DETECTED Final   Streptococcus species NOT DETECTED NOT DETECTED Final   Streptococcus agalactiae NOT DETECTED NOT DETECTED Final   Streptococcus pneumoniae NOT DETECTED NOT DETECTED Final   Streptococcus pyogenes NOT DETECTED NOT DETECTED Final   Acinetobacter baumannii NOT DETECTED NOT DETECTED Final   Enterobacteriaceae species NOT DETECTED NOT DETECTED Final   Enterobacter cloacae complex NOT DETECTED NOT DETECTED Final   Escherichia coli NOT DETECTED NOT DETECTED Final   Klebsiella oxytoca NOT DETECTED NOT DETECTED Final   Klebsiella pneumoniae NOT DETECTED NOT DETECTED Final   Proteus species NOT DETECTED NOT DETECTED Final   Serratia marcescens NOT DETECTED NOT DETECTED Final   Haemophilus influenzae NOT DETECTED NOT DETECTED Final   Neisseria meningitidis NOT DETECTED NOT DETECTED Final   Pseudomonas aeruginosa NOT DETECTED NOT DETECTED Final   Candida albicans NOT DETECTED NOT DETECTED Final   Candida glabrata NOT DETECTED NOT  DETECTED Final   Candida krusei NOT DETECTED NOT DETECTED Final   Candida parapsilosis NOT DETECTED NOT DETECTED Final   Candida tropicalis NOT DETECTED NOT DETECTED Final    Comment: Performed at [facility], [address] [place], [place] [address]  SARS Coronavirus 2 (CEPHEID - Performed in [place] hospital lab), Hosp Order     Status: None   Collection Time: 07/29/18  6:59 PM   Specimen: Nasopharyngeal Swab  Result Value Ref Range Status   SARS Coronavirus 2 NEGATIVE NEGATIVE Final    Comment: (NOTE) If result is NEGATIVE SARS-CoV-2 target nucleic acids are NOT DETECTED. The SARS-CoV-2 RNA is generally detectable in upper and lower  respiratory specimens during the acute phase of infection. The lowest  concentration of SARS-CoV-2 viral copies this assay can detect is 250  copies / mL. A negative result does not preclude SARS-CoV-2 infection  and should not be used as the sole basis for treatment or other  patient management decisions.  A negative result  may occur with  improper specimen collection / handling, submission of specimen other  than nasopharyngeal swab, presence of viral mutation(s) within the  areas targeted by this assay, and inadequate number of viral copies  (<250 copies / mL). A negative result must be combined with clinical  observations, patient history, and epidemiological information. If result is POSITIVE SARS-CoV-2 target nucleic acids are DETECTED. The SARS-CoV-2 RNA is generally detectable in upper and lower  respiratory specimens dur ing the acute phase of infection.  Positive  results are indicative of active infection with SARS-CoV-2.  Clinical  correlation with patient history and other diagnostic information is  necessary to determine patient infection status.  Positive results do  not rule out bacterial infection or co-infection with other viruses. If result is PRESUMPTIVE POSTIVE SARS-CoV-2 nucleic acids MAY BE PRESENT.   A  presumptive positive result was obtained on the submitted specimen  and confirmed on repeat testing.  While 2019 novel coronavirus  (SARS-CoV-2) nucleic acids may be present in the submitted sample  additional confirmatory testing may be necessary for epidemiological  and / or clinical management purposes  to differentiate between  SARS-CoV-2 and other Sarbecovirus currently known to infect humans.  If clinically indicated additional testing with an alternate test  methodology [phone]) is advised. The SARS-CoV-2 RNA is generally  detectable in upper and lower respiratory sp ecimens during the acute  phase of infection. The expected result is Negative. Fact Sheet for Patients:  StrictlyIdeas.no Fact Sheet for Healthcare Providers: BankingDealers.co.za This test is not yet approved or cleared by the [place] FDA and has been authorized for detection and/or diagnosis of SARS-CoV-2 by FDA under an Emergency Use Authorization (EUA).  This EUA will remain in effect (meaning this test can be used) for the duration of the COVID-19 declaration under Section 564(b)(1) of the Act, 21 U.S.C. section 360bbb-3(b)(1), unless the authorization is terminated or revoked sooner. Performed at [facility], [address] [place], [place] [address]     Coagulation Studies: No results for input(s): LABPROT, INR in the last 72 hours.  Urinalysis: Recent Labs    07/29/18 2137  COLORURINE YELLOW*  LABSPEC 1.013  PHURINE 6.0  GLUCOSEU NEGATIVE  HGBUR SMALL*  BILIRUBINUR NEGATIVE  KETONESUR NEGATIVE  PROTEINUR NEGATIVE  NITRITE NEGATIVE  LEUKOCYTESUR NEGATIVE      Imaging: Dg Chest Portable 1 View  Result Date: 07/29/2018 CLINICAL DATA:  Recent pneumonia.  HIV disease. EXAM: PORTABLE CHEST 1 VIEW COMPARISON:  July 07, 2018 FINDINGS: Endotracheal tube and nasogastric tube have been removed. No pneumothorax. There is patchy airspace  opacity in the right lower lobe with small right pleural effusion. Lungs elsewhere are clear. Heart size and pulmonary vascularity are normal. No adenopathy. No bone lesions. IMPRESSION: Patchy infiltrate right base with small right pleural effusion. There is less consolidation in this area compared to most recent study. Lungs elsewhere clear. Heart size normal. No evident adenopathy. Electronically Signed   By: Lowella Grip III M.D.   On: 07/29/2018 16:24     Medications:   . sodium chloride Stopped (07/30/18 0944)  .  ceFAZolin (ANCEF) IV 200 mL/hr at 07/30/18 1000   . collagenase   Topical Daily  . heparin  5,000 Units Subcutaneous Q8H   acetaminophen **OR** acetaminophen, albuterol, bisacodyl, guaiFENesin, HYDROcodone-acetaminophen, ondansetron **OR** ondansetron (ZOFRAN) IV, senna-docusate  Assessment/ Plan:  [name] is a 48 y.o. black male HIV currently not on treatment, tuberculosis, history of abscess drainage, who was admitted to  [facility] on 07/01/2018 for evaluation of altered mental status and unresponsiveness. Discharged to Hospice. Returns with pneumonia.   Patient was on CRRT from 6/10-6/12. Then intermittent hemodialysis on 6/13 and 6/16.   1.  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  Hyponatremia 3. Hyperkalemia  Plan - remove temp femoral left catheter. Unclear how this was not removed before last discharge - No indication for dialysis at this time.  - Continue IV fluids: Normal saline at 168mL/hr   LOS: 1 Joshua Dougherty 7/8/202011:54 AM

## 2018-07-31 LAB — CBC
HCT: 24.1 % — ABNORMAL LOW (ref 39.0–52.0)
Hemoglobin: 8.1 g/dL — ABNORMAL LOW (ref 13.0–17.0)
MCH: 31.8 pg (ref 26.0–34.0)
MCHC: 33.6 g/dL (ref 30.0–36.0)
MCV: 94.5 fL (ref 80.0–100.0)
Platelets: 243 10*3/uL (ref 150–400)
RBC: 2.55 MIL/uL — ABNORMAL LOW (ref 4.22–5.81)
RDW: 15.1 % (ref 11.5–15.5)
WBC: 9.7 10*3/uL (ref 4.0–10.5)
nRBC: 0 % (ref 0.0–0.2)

## 2018-07-31 LAB — BPAM RBC
Blood Product Expiration Date: 202008102359
Blood Product Expiration Date: 202008102359
ISSUE DATE / TIME: 202007081843
ISSUE DATE / TIME: 202007082202
Unit Type and Rh: 5100
Unit Type and Rh: 5100

## 2018-07-31 LAB — BASIC METABOLIC PANEL
Anion gap: 8 (ref 5–15)
BUN: 23 mg/dL — ABNORMAL HIGH (ref 6–20)
CO2: 20 mmol/L — ABNORMAL LOW (ref 22–32)
Calcium: 7.5 mg/dL — ABNORMAL LOW (ref 8.9–10.3)
Chloride: 103 mmol/L (ref 98–111)
Creatinine, Ser: 1.02 mg/dL (ref 0.61–1.24)
GFR calc Af Amer: >60 mL/min (ref >60)
GFR calc non Af Amer: >60 mL/min (ref >60)
Glucose, Bld: 117 mg/dL — ABNORMAL HIGH (ref 70–99)
Potassium: 4.1 mmol/L (ref 3.5–5.1)
Sodium: 131 mmol/L — ABNORMAL LOW (ref 135–145)

## 2018-07-31 LAB — TYPE AND SCREEN
ABO/RH(D): O POS
Antibody Screen: NEGATIVE
Unit division: 0
Unit division: 0

## 2018-07-31 LAB — MAGNESIUM: Magnesium: 2 mg/dL (ref 1.7–2.4)

## 2018-07-31 LAB — CORTISOL-AM, BLOOD: Cortisol - AM: 23.2 ug/dL — ABNORMAL HIGH (ref 6.7–22.6)

## 2018-07-31 MED ORDER — ATOVAQUONE 750 MG/5ML PO SUSP
1500.0000 mg | Freq: Every day | ORAL | Status: DC
  Administered 2018-08-01 – 2018-08-05 (×5): 1500 mg via ORAL
  Filled 2018-07-31 (×6): qty 10

## 2018-07-31 MED ORDER — DRONABINOL 2.5 MG PO CAPS
2.5000 mg | ORAL_CAPSULE | Freq: Two times a day (BID) | ORAL | Status: DC
  Administered 2018-07-31 – 2018-08-03 (×6): 2.5 mg via ORAL
  Filled 2018-07-31 (×9): qty 1

## 2018-07-31 NOTE — Progress Notes (Signed)
[facility] [place] NAME: Joshua Dougherty    MR#:  [id number]  DATE OF BIRTH:  Mar 05, 1970  SUBJECTIVE:  CHIEF COMPLAINT:   Chief Complaint  Patient presents with  . Chronic Renal Failure   No new complaints.  No fevers overnight.  Requesting for appetite stimulant to help improve p.o. intake.  Started on Marinol  REVIEW OF SYSTEMS:  Review of Systems  Constitutional: Positive for weight loss. Negative for chills and fever.  HENT: Negative for hearing loss and tinnitus.   Eyes: Negative for blurred vision and double vision.  Respiratory: Negative for cough and shortness of breath.   Cardiovascular: Negative for chest pain and palpitations.  Gastrointestinal: Negative for heartburn, nausea and vomiting.  Genitourinary: Negative for dysuria.  Musculoskeletal: Negative for myalgias and neck pain.  Skin: Negative for itching and rash.  Neurological: Negative for dizziness and headaches.  Psychiatric/Behavioral: Negative for depression and hallucinations.    DRUG ALLERGIES:  No Known Allergies VITALS:  Blood pressure 116/74, pulse 80, temperature 98.6 F (37 C), temperature source Oral, resp. rate 20, height 6\' 4"  (1.93 m), weight 68 kg, SpO2 100 %. PHYSICAL EXAMINATION:   Physical Exam  Constitutional: He is oriented to person, place, and time. He appears well-developed.  Thin  HENT:  Head: Normocephalic and atraumatic.  Right Ear: External ear normal.  Eyes: Pupils are equal, round, and reactive to light. Conjunctivae are normal. Right eye exhibits no discharge.  Neck: Normal range of motion.  Cardiovascular: Normal rate, regular rhythm and normal heart sounds.  Respiratory: Effort normal. He has rales.  GI: Soft. Bowel sounds are normal. He exhibits no distension.  Musculoskeletal: Normal range of motion.        General: No edema.  Neurological: He is alert and oriented to person, place, and time. No cranial nerve deficit.   Skin: Skin is warm. He is not diaphoretic. No erythema.  Psychiatric: He has a normal mood and affect. His behavior is normal.     LABORATORY PANEL:  Male CBC Recent Labs  Lab 07/31/18 0350  WBC 9.7  HGB 8.1*  HCT 24.1*  PLT 243   ------------------------------------------------------------------------------------------------------------------ Chemistries  Recent Labs  Lab 07/29/18 1600  07/31/18 0350  NA 130*   < > 131*  K 6.0*   < > 4.1  CL 92*   < > 103  CO2 25   < > 20*  GLUCOSE 108*   < > 117*  BUN 27*   < > 23*  CREATININE 1.56*   < > 1.02  CALCIUM 8.9   < > 7.5*  MG  --    < > 2.0  AST 49*  --   --   ALT 27  --   --   ALKPHOS 250*  --   --   BILITOT 1.3*  --   --    < > = values in this interval not displayed.   RADIOLOGY:  No results found. ASSESSMENT AND PLAN:   1. Right lower lobe neumonia with small pleural effusion Patient initially placed empirically on IV antibiotics initially with cefepime and azithromycin.  MSSA bacteremia ;blood cultures growing staph aureus.  Antibiotics later changed to cefazolin based on cultures and discussion with pharmacy.  Hemodialysis catheter appears to have been left in place when patient was recently discharged to hospice.  This has now been discontinued.  Follow-up on catheter tip culture. IV fluid hydration.  PRN Robitussin.  Monitor clinically.  Patient seen by infectious disease specialist.  Recommended TEE.  I have placed cardiology consult for TEE If patient complains of any back pain, will require imaging of the spine.  2.  Hyponatremia; sodium level improved from 127-131.  3.  Hyperkalemia with potassium of 6.0 Resolved.  4.  Acute kidney injury; renal function improved Renal ultrasound consistent with HIV-associated nephropathy.  Nephrology following.  No indication for hemodialysis at this time.   5.  History of HIV.   Seen by infectious disease specialist.  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  Patient is interested in starting treatment.  He does not have insurance.    ID plans to start him on antiretroviral during this hospitalization.    Sent genotype. PCP prophylaxis with atovaquone  6. Anemia of chronic disease. Noted drop in hemoglobin to 6.6 recently.  Transfused with 1 unit of packed red blood cells with improvement in hemoglobin to 8.1. Clinically no evidence of bleeding.  7.  Unstageable sacral pressure injury. Present on admission.  Wound care following.  DVT prophylaxis; SCDs for now.  Avoiding heparin products due to anemia requiring packed red blood cell transfusion.  All the records are reviewed and case discussed with Care Management/Social Worker. Management plans discussed with the patient, family and they are in agreement.  CODE STATUS: Full Code  TOTAL TIME TAKING CARE OF THIS PATIENT: 36 minutes.   More than 50% of the time was spent in counseling/coordination of care: YES  POSSIBLE D/C IN 2 DAYS, DEPENDING ON CLINICAL CONDITION.   Keshayla Schrum M.D on 07/31/2018 at 1:12 PM  Between 7am to 6pm - Pager - [phone]  After 6pm go to [email or web address] - Proofreader  [facility]  Office  [phone]  CC: Primary care physician; Patient, No Pcp Per  Note: This dictation was prepared with Dragon dictation along with smaller phrase technology. Any transcriptional errors that result from this process are unintentional.

## 2018-07-31 NOTE — Progress Notes (Signed)
   Date of Admission:  07/29/2018     Subjective Pt says he is okay No fever or chills Started marinol to boost his appetite  Note from [facility] HIV Diagnosis date: 2012 Risk factors: heterosexual contact  [place] CD4 count:394 % Date Pretreatment viral load (if known)  ARV treatment history : stribild since 3/ 2015 stable ud  Genotype history: type of mutations: none 3/15 pansensitive History of Opportunistic Infections:no  TB or LTBI history: pos treated ltbi  Hep A ab+ Hep B sab+ hcv ab neg 2016  G6pd 340 (normal) Previous/most recent care location: [place] Most recent cd4/viral load 02/12/14 <20 bl vt .94 cd4 848 HLA [id number] Negative 04/19/13 STI history: none   Medications:  . collagenase   Topical Daily  . dronabinol  2.5 mg Oral BID AC  . feeding supplement (ENSURE ENLIVE)  237 mL Oral TID BM  . multivitamin-lutein  1 capsule Oral Daily    Objective: Vital signs in last 24 hours: Temp:  [97.9 F (36.6 C)-98.8 F (37.1 C)] 97.9 F (36.6 C) (07/09 1551) Pulse Rate:  [67-90] 67 (07/09 1549) Resp:  [18-20] 20 (07/09 1549) BP: (94-119)/(59-78) 100/73 (07/09 1549) SpO2:  [100 %] 100 % (07/09 0357)  PHYSICAL EXAM:  General: Alert, cooperative, no distress, appears stated age. emaciated Neurologic: Grossly non-focal  Lab Results Recent Labs    07/30/18 0527 07/30/18 1331 07/31/18 0350  WBC 14.3*  --  9.7  HGB 6.6* 6.6* 8.1*  HCT 19.8* 19.7* 24.1*  NA 131*  --  131*  K 4.7  --  4.1  CL 102  --  103  CO2 22  --  20*  BUN 26*  --  23*  CREATININE 1.46*  --  1.02   Liver Panel Recent Labs    07/29/18 1600  PROT 8.7*  ALBUMIN 2.8*  AST 49*  ALT 27  ALKPHOS 250*  BILITOT 1.3*   Sedimentation Rate Recent Labs    07/30/18 1613  ESRSEDRATE 126*   C-Reactive Protein No results for input(s): CRP in the last 72 hours.  Microbiology:  Studies/Results: No results  found.   Assessment/Plan: Impression/Recommendation ? Staph aureus bacteremia:  source  the femoral dialysis catheter which has been left behind for the past 4 weeks .  TEE tomorrow, may need imaging of the spine.  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  Patient is interested in starting treatment.  He does not have insurance.  So we will need to get him connected to community center in [place].  But will start him on antiretroviral during this hospitalization.  Will send genotype. PCP prophylaxis with atovaquone  Weight loss and failure to thrive: With low sodium and high potassium on admission adrenal insufficiency ruled out -Morning cortisol sent and is   25  Anemia :need to rule out Mycobacterium avium. Sent blood cultures for MAI ? 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

## 2018-07-31 NOTE — TOC Progression Note (Signed)
Transition of Care [facility]) - Progression Note    Patient Details  Name: Joshua Dougherty MRN: [id number] Date of Birth: 06-12-1970  [facility] CM/SW Contact  Shelbie Hutching, RN Phone Number: 07/31/2018, 4:20 PM  Clinical Narrative:    Patient did well with PT today, able to walk around the nurses station with a walker.  Patient will need a walker at discharge and the prospect is good for him to be able to go home with home health.  Patient is not ready for discharge at this time.  TOC team will cont to follow and assist with discharge planning.  RNCM was able to reach out to Robie Ridge, care manager with [facility]- [phone], Almyra Free will be able to help schedule follow up for infectious disease after discharge, patient will need help with AIDS medications.  ID is following inpatient.     Expected Discharge Plan: [place] Barriers to Discharge: Continued Medical Work up  Expected Discharge Plan and Services Expected Discharge Plan: [place]   Discharge Planning Services: CM Consult   Living arrangements for the past 2 months: Single Family Home, Hospice Facility(hospice facility for the past month before that home) Expected Discharge Date: 08/01/18                                     Social Determinants of Health (SDOH) Interventions    Readmission Risk Interventions No flowsheet data found.

## 2018-07-31 NOTE — Evaluation (Signed)
Physical Therapy Evaluation Patient Details Name: Joshua Dougherty MRN: [id number] DOB: March 05, 1970 Today's Date: 07/31/2018   History of Present Illness  presented to ER secondary to renal failure; admitted for management of PNA, hyponatremia, hypokalemia (now corrected). Of note, patient has been at hospice home for end-of-life care prior to this admission (declining dialysis), recently revoked hospice benefit and transitioned to full care/code.  L groin dialysis cath removed previous date.  Clinical Impression  Upon evaluation, patient alert and oriented; follows commands, eager for OOB/mobility efforts.  Generally weak and deconditioned throughout all extremities; extremely cachetic.  Demonstrates ability to complete bed mobility with mod indep; sit/stand, basic transfers and gait (50-150') with RW, cga/close sup; up/down single step with L rail/HHA, min assist.  Fatigues quickly, but pleased by progress and performance this date. Eager for continued work with PT to recover strength and mobility; in agreement that walker is optimal at this time. Would benefit from skilled PT to address above deficits and promote optimal return to PLOF;d Recommend transition to HHPT upon discharge from acute hospitalization.     Follow Up Recommendations Home health PT    Equipment Recommendations  Rolling walker with 5" wheels;Hospital bed    Recommendations for Other Services       Precautions / Restrictions Precautions Precautions: Fall Restrictions Weight Bearing Restrictions: No      Mobility  Bed Mobility Overal bed mobility: Modified Independent                Transfers Overall transfer level: Needs assistance Equipment used: Rolling walker (2 wheeled) Transfers: Sit to/from Stand Sit to Stand: Min guard;Min assist         General transfer comment: requires mildly elevated seating surface due to height  Ambulation/Gait Ambulation/Gait assistance: Min assist Gait Distance  (Feet): 50 Feet         General Gait Details: reciprocal stepping pattern, narrowed BOS, increased sway with intermittent scissoring and min assist for balance recovery  Stairs Stairs: Yes Stairs assistance: Min assist Stair Management: One rail Left Number of Stairs: 1 General stair comments: up/down single step with L rail; generally weak and uncertain.  Declines additional stairs due to weakness and fatigue  Wheelchair Mobility    Modified Rankin (Stroke Patients Only)       Balance Overall balance assessment: Needs assistance Sitting-balance support: No upper extremity supported;Feet supported Sitting balance-Leahy Scale: Good     Standing balance support: No upper extremity supported Standing balance-Leahy Scale: Fair                               Pertinent Vitals/Pain Pain Assessment: No/denies pain    Home Living Family/patient expects to be discharged to:: Private residence Living Arrangements: Alone Available Help at Discharge: Family;Available PRN/intermittently Type of Home: House Home Access: Stairs to enter   [employer] of Steps: 2 Home Layout: Two level;Bed/bath upstairs;1/2 bath on main level Home Equipment: None      Prior Function Level of Independence: Independent         Comments: Indep with ADLs, household and community mobilization; working full-time as Librarian, 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        Extremity/Trunk Assessment   Upper Extremity Assessment Upper Extremity Assessment: Generalized weakness    Lower Extremity Assessment Lower Extremity Assessment: Generalized weakness(grossly 4-/5 throughout; generally cachectic and deconditioned throughout)       Communication   Communication: No difficulties  Cognition Arousal/Alertness: Awake/alert Behavior During Therapy: WFL for tasks assessed/performed Overall Cognitive Status: Within Functional Limits for tasks assessed                                         General Comments      Exercises Other Exercises Other Exercises: 150' with RW, cga/min assist--improved gait mechanics, fluidity and overall stability; do recommend continued use of RW for all mobility at this time.  Patient voiced understanding and agreement.   Assessment/Plan    PT Assessment Patient needs continued PT services  PT Problem List Decreased strength;Decreased cognition;Decreased activity tolerance;Decreased balance;Decreased mobility;Decreased knowledge of use of DME;Decreased safety awareness;Decreased skin integrity;Pain;Decreased knowledge of precautions       PT Treatment Interventions DME instruction;Gait training;Stair training;Functional mobility training;Therapeutic activities;Therapeutic exercise;Balance training;Patient/family education    PT Goals (Current goals can be found in the Care Plan section)  Acute Rehab PT Goals Patient Stated Goal: to return home and get my strength back PT Goal Formulation: With patient Time For Goal Achievement: 08/14/18 Potential to Achieve Goals: Good    Frequency Min 2X/week   Barriers to discharge        Co-evaluation               AM-PAC PT "6 Clicks" Mobility  Outcome Measure Help needed turning from your back to your side while in a flat bed without using bedrails?: None Help needed moving from lying on your back to sitting on the side of a flat bed without using bedrails?: None Help needed moving to and from a bed to a chair (including a wheelchair)?: None Help needed standing up from a chair using your arms (e.g., wheelchair or bedside chair)?: A Little Help needed to walk in hospital room?: A Little Help needed climbing 3-5 steps with a railing? : A Little 6 Click Score: 21    End of Session Equipment Utilized During Treatment: Gait belt Activity Tolerance: Patient tolerated treatment well Patient left: in chair;with call bell/phone within reach;with chair  alarm set Nurse Communication: Mobility status PT Visit Diagnosis: Difficulty in walking, not elsewhere classified (R26.2);Muscle weakness (generalized) (M62.81)    Time: 5003-7048 PT Time Calculation (min) (ACUTE ONLY): 27 min   Charges:   PT Evaluation $PT Eval Moderate Complexity: 1 Mod PT Treatments $Gait Training: 8-22 mins       Aanika Defoor H. Owens Shark, PT, DPT, NCS 07/31/18, 4:34 PM [phone]

## 2018-07-31 NOTE — Consult Note (Signed)
Reason for Consult: Bacteremia possible SBE Referring Physician: Dr. Stark Jock hospitalist  Joshua Dougherty is an 48 y.o. male.  HPI: Patient is a 48 year old black male history of HIV disease renal disorder on dialysis history of TB recently was admitted was severely ill at the time patient was discharged to hospice did reasonably well for about a month and now return to the hospital for further assessment now is being evaluated for possible SBE needing a TEE denies any fever has had significant weight loss no pain  Past Medical History:  Diagnosis Date  . HIV (human immunodeficiency virus infection) ([place])   . Renal disorder   . Tuberculosis     Past Surgical History:  Procedure Laterality Date  . ABSCESS DRAINAGE  2013   chest wall; patient states it was not contiguous with his lung however this was an operation under general anesthesia  . FLEXIBLE BRONCHOSCOPY Right 03/28/2018   Procedure: FLEXIBLE BRONCHOSCOPY, RIGHT;  Surgeon: Laverle Hobby, MD;  Location: [facility] ORS;  Service: Pulmonary;  Laterality: Right;    History reviewed. No pertinent family history.  Social History:  reports that he has quit smoking. He has a 5.00 pack-year smoking history. He has never used smokeless tobacco. He reports current alcohol use of about 2.0 - 3.0 standard drinks of alcohol per week. He reports that he does not use drugs.  Allergies: No Known Allergies  Medications: I have reviewed the patient's current medications.  Results for orders placed or performed during the hospital encounter of 07/29/18 (from the past 48 hour(s))  Lactic acid, plasma     Status: None   Collection Time: 07/29/18  5:31 PM  Result Value Ref Range   Lactic Acid, Venous 1.5 0.5 - 1.9 mmol/L    Comment: Performed at [facility], [address] [place], [place] [address]  Basic metabolic panel     Status: Abnormal   Collection Time: 07/29/18  5:31 PM  Result Value Ref Range   Sodium 127 (L) 135 - 145 mmol/L    Potassium 5.5 (H) 3.5 - 5.1 mmol/L   Chloride 94 (L) 98 - 111 mmol/L   CO2 24 22 - 32 mmol/L   Glucose, Bld 111 (H) 70 - 99 mg/dL   BUN 28 (H) 6 - 20 mg/dL   Creatinine, Ser 1.61 (H) 0.61 - 1.24 mg/dL   Calcium 8.4 (L) 8.9 - 10.3 mg/dL   GFR calc non Af Amer 49 (L) >60 mL/min   GFR calc Af Amer 57 (L) >60 mL/min   Anion gap 9 5 - 15    Comment: Performed at [facility], [address] [place], [place] [address]  Sample to Blood Bank     Status: None   Collection Time: 07/29/18  5:31 PM  Result Value Ref Range   Blood Bank Specimen SAMPLE AVAILABLE FOR TESTING    Sample Expiration      08/01/2018,2359 Performed at [facility], [address] [place], [place] [address]   Type and screen [place]     Status: None   Collection Time: 07/29/18  5:31 PM  Result Value Ref Range   ABO/RH(D) O POS    Antibody Screen NEG    Sample Expiration 08/01/2018,2359    Unit Number [id number]    Blood Component Type RED CELLS,LR    Unit division 00    Status of Unit ISSUED,FINAL    Transfusion Status OK TO TRANSFUSE    Crossmatch Result  Compatible Performed at [facility], [address] [place] B[place], [place] [address]    Unit Number [id number]    Blood Component Type RBC LR PHER1    Unit division 00    Status of Unit ISSUED,FINAL    Transfusion Status OK TO TRANSFUSE    Crossmatch Result Compatible   SARS Coronavirus 2 (CEPHEID - Performed in [place] hospital lab), Hosp Order     Status: None   Collection Time: 07/29/18  6:59 PM   Specimen: Nasopharyngeal Swab  Result Value Ref Range   SARS Coronavirus 2 NEGATIVE NEGATIVE    Comment: (NOTE) If result is NEGATIVE SARS-CoV-2 target nucleic acids are NOT DETECTED. The SARS-CoV-2 RNA is generally detectable in upper and lower  respiratory specimens during the acute phase of infection. The lowest  concentration of SARS-CoV-2 viral copies this assay can detect is 250   copies / mL. A negative result does not preclude SARS-CoV-2 infection  and should not be used as the sole basis for treatment or other  patient management decisions.  A negative result may occur with  improper specimen collection / handling, submission of specimen other  than nasopharyngeal swab, presence of viral mutation(s) within the  areas targeted by this assay, and inadequate number of viral copies  (<250 copies / mL). A negative result must be combined with clinical  observations, patient history, and epidemiological information. If result is POSITIVE SARS-CoV-2 target nucleic acids are DETECTED. The SARS-CoV-2 RNA is generally detectable in upper and lower  respiratory specimens dur ing the acute phase of infection.  Positive  results are indicative of active infection with SARS-CoV-2.  Clinical  correlation with patient history and other diagnostic information is  necessary to determine patient infection status.  Positive results do  not rule out bacterial infection or co-infection with other viruses. If result is PRESUMPTIVE POSTIVE SARS-CoV-2 nucleic acids MAY BE PRESENT.   A presumptive positive result was obtained on the submitted specimen  and confirmed on repeat testing.  While 2019 novel coronavirus  (SARS-CoV-2) nucleic acids may be present in the submitted sample  additional confirmatory testing may be necessary for epidemiological  and / or clinical management purposes  to differentiate between  SARS-CoV-2 and other Sarbecovirus currently known to infect humans.  If clinically indicated additional testing with an alternate test  methodology [phone]) is advised. The SARS-CoV-2 RNA is generally  detectable in upper and lower respiratory sp ecimens during the acute  phase of infection. The expected result is Negative. Fact Sheet for Patients:  StrictlyIdeas.no Fact Sheet for Healthcare  Providers: BankingDealers.co.za This test is not yet approved or cleared by the [place] FDA and has been authorized for detection and/or diagnosis of SARS-CoV-2 by FDA under an Emergency Use Authorization (EUA).  This EUA will remain in effect (meaning this test can be used) for the duration of the COVID-19 declaration under Section 564(b)(1) of the Act, 21 U.S.C. section 360bbb-3(b)(1), unless the authorization is terminated or revoked sooner. Performed at [facility], [address] [place], [place] [address]   Urinalysis, Complete w Microscopic     Status: Abnormal   Collection Time: 07/29/18  9:37 PM  Result Value Ref Range   Color, Urine YELLOW (A) YELLOW   APPearance CLEAR (A) CLEAR   Specific Gravity, Urine 1.013 1.005 - 1.030   pH 6.0 5.0 - 8.0   Glucose, UA NEGATIVE NEGATIVE mg/dL   Hgb urine dipstick SMALL (A) NEGATIVE   Bilirubin Urine NEGATIVE NEGATIVE  Ketones, ur NEGATIVE NEGATIVE mg/dL   Protein, ur NEGATIVE NEGATIVE mg/dL   Nitrite NEGATIVE NEGATIVE   Leukocytes,Ua NEGATIVE NEGATIVE   RBC / HPF 21-50 0 - 5 RBC/hpf   WBC, UA 0-5 0 - 5 WBC/hpf   Bacteria, UA RARE (A) NONE SEEN   Squamous Epithelial / LPF NONE SEEN 0 - 5   Mucus PRESENT    Hyaline Casts, UA PRESENT     Comment: Performed at [facility], [address] [place], [place] [address]  Basic metabolic panel     Status: Abnormal   Collection Time: 07/30/18  5:27 AM  Result Value Ref Range   Sodium 131 (L) 135 - 145 mmol/L   Potassium 4.7 3.5 - 5.1 mmol/L   Chloride 102 98 - 111 mmol/L   CO2 22 22 - 32 mmol/L   Glucose, Bld 89 70 - 99 mg/dL   BUN 26 (H) 6 - 20 mg/dL   Creatinine, Ser 1.46 (H) 0.61 - 1.24 mg/dL   Calcium 7.7 (L) 8.9 - 10.3 mg/dL   GFR calc non Af Amer 56 (L) >60 mL/min   GFR calc Af Amer >60 >60 mL/min   Anion gap 7 5 - 15    Comment: Performed at [facility], [address] [place], [place] [address]  CBC     Status:  Abnormal   Collection Time: 07/30/18  5:27 AM  Result Value Ref Range   WBC 14.3 (H) 4.0 - 10.5 K/uL   RBC 2.04 (L) 4.22 - 5.81 MIL/uL   Hemoglobin 6.6 (L) 13.0 - 17.0 g/dL    Comment: REPEATED TO VERIFY   HCT 19.8 (L) 39.0 - 52.0 %   MCV 97.1 80.0 - 100.0 fL   MCH 32.4 26.0 - 34.0 pg   MCHC 33.3 30.0 - 36.0 g/dL   RDW 14.6 11.5 - 15.5 %   Platelets 267 150 - 400 K/uL   nRBC 0.0 0.0 - 0.2 %    Comment: Performed at [facility], [address], [place], [place] [address]  Magnesium     Status: Abnormal   Collection Time: 07/30/18 12:37 PM  Result Value Ref Range   Magnesium 1.4 (L) 1.7 - 2.4 mg/dL    Comment: Performed at [facility], [address] [place], [place] [address]  Phosphorus     Status: None   Collection Time: 07/30/18 12:37 PM  Result Value Ref Range   Phosphorus 4.1 2.5 - 4.6 mg/dL    Comment: Performed at [facility], [address] [place], [place] [address]  Hemoglobin and hematocrit, blood     Status: Abnormal   Collection Time: 07/30/18  1:31 PM  Result Value Ref Range   Hemoglobin 6.6 (L) 13.0 - 17.0 g/dL   HCT 19.7 (L) 39.0 - 52.0 %    Comment: Performed at [facility], [address] [place], [place] [address]  Sedimentation rate     Status: Abnormal   Collection Time: 07/30/18  4:13 PM  Result Value Ref Range   Sed Rate 126 (H) 0 - 15 mm/hr    Comment: Performed at [facility], [address] [place], [place] [address]  Ferritin     Status: Abnormal   Collection Time: 07/30/18  4:13 PM  Result Value Ref Range   Ferritin 2,203 (H) 24 - 336 ng/mL    Comment: Performed at [facility], [address] [place], [place] [address]  Reticulocytes  Status: Abnormal   Collection Time: 07/30/18  4:13 PM  Result Value Ref Range   Retic Ct Pct 2.3 0.4 - 3.1 %   RBC. 1.82 (L) 4.22 - 5.81 MIL/uL   Retic Count, Absolute 41.0 19.0 - 186.0 K/uL   Immature Retic Fract 17.8 (H) 2.3 - 15.9 %     Comment: Performed at [facility], [address] [place], [place] [address]  Prepare RBC     Status: None   Collection Time: 07/30/18  4:13 PM  Result Value Ref Range   Order Confirmation      ORDER PROCESSED BY BLOOD BANK Performed at [facility], [address], [place], [place] [address]   ABO/Rh     Status: None   Collection Time: 07/30/18  4:13 PM  Result Value Ref Range   ABO/RH(D)      O POS Performed at [facility], [address] [place], [place] [address]   Cath Tip Culture     Status: Abnormal (Preliminary result)   Collection Time: 07/30/18  4:31 PM   Specimen: Catheter Tip; Other  Result Value Ref Range   Specimen Description      CATH TIP Performed at [facility], [address] [place], [place] [address]    Special Requests      NONE Performed at [facility] [address] [place], [place] [address]    Culture (A)     STAPHYLOCOCCUS AUREUS SUSCEPTIBILITIES TO FOLLOW Performed at [facility], [place] [address] [place], [place] [address]    Report Status PENDING   Basic metabolic panel     Status: Abnormal   Collection Time: 07/31/18  3:50 AM  Result Value Ref Range   Sodium 131 (L) 135 - 145 mmol/L   Potassium 4.1 3.5 - 5.1 mmol/L   Chloride 103 98 - 111 mmol/L   CO2 20 (L) 22 - 32 mmol/L   Glucose, Bld 117 (H) 70 - 99 mg/dL   BUN 23 (H) 6 - 20 mg/dL   Creatinine, Ser 1.02 0.61 - 1.24 mg/dL   Calcium 7.5 (L) 8.9 - 10.3 mg/dL   GFR calc non Af Amer >60 >60 mL/min   GFR calc Af Amer >60 >60 mL/min   Anion gap 8 5 - 15    Comment: Performed at [facility], [address] [place],  [address]  CBC     Status: Abnormal   Collection Time: 07/31/18  3:50 AM  Result Value Ref Range   WBC 9.7 4.0 - 10.5 K/uL   RBC 2.55 (L) 4.22 - 5.81 MIL/uL   Hemoglobin 8.1 (L) 13.0 - 17.0 g/dL   HCT 24.1 (L) 39.0 - 52.0 %   MCV 94.5 80.0 - 100.0 fL   MCH 31.8 26.0 - 34.0 pg   MCHC 33.6  30.0 - 36.0 g/dL   RDW 15.1 11.5 - 15.5 %   Platelets 243 150 - 400 K/uL   nRBC 0.0 0.0 - 0.2 %    Comment: Performed at [facility], [address], [place],  [address]  Magnesium     Status: None   Collection Time: 07/31/18  3:50 AM  Result Value Ref Range   Magnesium 2.0 1.7 - 2.4 mg/dL    Comment: Performed at [facility], [address] [place],  [address]  Cortisol-am, blood     Status: Abnormal   Collection Time: 07/31/18  3:52 AM  Result Value Ref  Range   Cortisol - AM 23.2 (H) 6.7 - 22.6 ug/dL    Comment: Performed at [facility] [address], [place], [place] [address]    No results found.  Review of Systems  Constitutional: Positive for malaise/fatigue and weight loss.  HENT: Negative.   Eyes: Negative.   Respiratory: Negative.   Cardiovascular: Negative.   Gastrointestinal: Negative.   Genitourinary: Negative.   Musculoskeletal: Positive for back pain and myalgias.  Skin: Negative.   Neurological: Positive for dizziness.  Endo/Heme/Allergies: Negative.   Psychiatric/Behavioral: Negative.    Blood pressure 100/73, pulse 67, temperature 97.9 F (36.6 C), temperature source Oral, resp. rate 20, height _0  (1.93 m), weight 68 kg, SpO2 100 %. Physical Exam  Nursing note and vitals reviewed. Constitutional: He appears well-developed and well-nourished.  HENT:  Head: Normocephalic and atraumatic.    Assessment/Plan:  bacteremia Possible SBE End-stage renal disease Hypertension HIV Hx tuberculosis Anemia . Plan Proceed with TEE in the morning Agree with conservative medical therapy Continue renal input for dialysis therapy Consider broad-spectrum antibiotic therapy Advised patient to refrain from tobacco abuse Continue conservative medical therapy   Dwayne D Callwood 07/31/2018, 5:20 PM

## 2018-07-31 NOTE — Progress Notes (Signed)
[facility]  ROUNDING NOTE   Subjective:   Laying in bed. States he is feeling better.   Dialysis catheter removed yesterday  Objective:  Vital signs in last 24 hours:  Temp:  [98.3 F (36.8 C)-98.8 F (37.1 C)] 98.6 F (37 C) (07/09 0357) Pulse Rate:  [70-90] 80 (07/09 0357) Resp:  [18-20] 20 (07/09 0119) BP: (94-119)/(59-78) 116/74 (07/09 0357) SpO2:  [100 %] 100 % (07/09 0357)  Weight change:  Filed Weights   07/29/18 1519  Weight: 68 kg    Intake/Output: I/O last 3 completed shifts: In: 5833.4 [P.O.:240; I.V.:2199.5; Blood:694; IV Piggyback:2699.9] Out: 1100 [Urine:1100]   Intake/Output this shift:  Total I/O In: 360 [P.O.:360] Out: 300 [Urine:300]  Physical Exam: General: cachectic  Head: +temporal wasting  Eyes: Anicteric  Neck:   trachea midline  Lungs:  Basilar crackles  Heart: regular  Abdomen:  Soft, bowel sounds present  Extremities: no peripheral edema.  Neurologic: Sedated, intubated  Skin: No lesions         Basic Metabolic Panel: Recent Labs  Lab 07/29/18 1600 07/29/18 1731 07/30/18 0527 07/30/18 1237 07/31/18 0350  NA 130* 127* 131*  --  131*  K 6.0* 5.5* 4.7  --  4.1  CL 92* 94* 102  --  103  CO2 25 24 22   --  20*  GLUCOSE 108* 111* 89  --  117*  BUN 27* 28* 26*  --  23*  CREATININE 1.56* 1.61* 1.46*  --  1.02  CALCIUM 8.9 8.4* 7.7*  --  7.5*  MG  --   --   --  1.4* 2.0  PHOS  --   --   --  4.1  --     Liver Function Tests: Recent Labs  Lab 07/29/18 1600  AST 49*  ALT 27  ALKPHOS 250*  BILITOT 1.3*  PROT 8.7*  ALBUMIN 2.8*   Recent Labs  Lab 07/29/18 1600  LIPASE 24   No results for input(s): AMMONIA in the last 168 hours.  CBC: Recent Labs  Lab 07/29/18 1600 07/30/18 0527 07/30/18 1331 07/31/18 0350  WBC 9.6 14.3*  --  9.7  NEUTROABS 5.5  --   --   --   HGB 10.2* 6.6* 6.6* 8.1*  HCT 30.3* 19.8* 19.7* 24.1*  MCV 95.6 97.1  --  94.5  PLT 389 267  --  243    Cardiac Enzymes: No results  for input(s): CKTOTAL, CKMB, CKMBINDEX, TROPONINI in the last 168 hours.  BNP: Invalid input(s): POCBNP  CBG: No results for input(s): GLUCAP in the last 168 hours.  Microbiology: Results for orders placed or performed during the hospital encounter of 07/29/18  Culture, blood (routine x 2)     Status: Abnormal (Preliminary result)   Collection Time: 07/29/18  3:50 PM   Specimen: BLOOD  Result Value Ref Range Status   Specimen Description   Final    BLOOD RIGHT ARM Performed at [facility], [address] [place], [place] [address]    Special Requests   Final    BOTTLES DRAWN AEROBIC AND ANAEROBIC Blood Culture adequate volume Performed at [facility], [address], [place], [place] [address]    Culture  Setup Time   Final    GRAM POSITIVE COCCI IN BOTH AEROBIC AND ANAEROBIC BOTTLES CRITICAL RESULT CALLED TO, READ BACK BY AND VERIFIED WITH: CHARLES SHANLEVER AT 0804 ON 07/30/2018 [place]. Performed at [facility], [address] [place], [place] [address]  Culture (A)  Final    STAPHYLOCOCCUS AUREUS SUSCEPTIBILITIES TO FOLLOW Performed at [facility], [place] [address] [place], [place] [address]    Report Status PENDING  Incomplete  Culture, blood (routine x 2)     Status: Abnormal (Preliminary result)   Collection Time: 07/29/18  3:50 PM   Specimen: BLOOD  Result Value Ref Range Status   Specimen Description   Final    BLOOD LEFT ARM Performed at [facility], [address] [place], [place] [address]    Special Requests   Final    BOTTLES DRAWN AEROBIC AND ANAEROBIC Blood Culture adequate volume Performed at [facility], [address] [place], [place] [address]    Culture  Setup Time   Final    GRAM POSITIVE COCCI IN BOTH AEROBIC AND ANAEROBIC BOTTLES CRITICAL RESULT CALLED TO, READ BACK BY AND VERIFIED WITH: CHARLES SHANLEVER AT 0804 ON 07/30/2018 [place].    Culture (A)  Final    STAPHYLOCOCCUS  AUREUS SUSCEPTIBILITIES TO FOLLOW Performed at [facility], [place] [address] [place], [place] [address]    Report Status PENDING  Incomplete  Blood Culture ID Panel (Reflexed)     Status: Abnormal   Collection Time: 07/29/18  3:50 PM  Result Value Ref Range Status   Enterococcus species NOT DETECTED NOT DETECTED Final   Listeria monocytogenes NOT DETECTED NOT DETECTED Final   Staphylococcus species DETECTED (A) NOT DETECTED Final    Comment: CRITICAL RESULT CALLED TO, READ BACK BY AND VERIFIED WITH: CHARLES SHANLEVER AT 0804 ON 07/30/2018 [place].    Staphylococcus aureus (BCID) DETECTED (A) NOT DETECTED Final    Comment: Methicillin (oxacillin) susceptible Staphylococcus aureus (MSSA). Preferred therapy is anti staphylococcal beta lactam antibiotic (Cefazolin or Nafcillin), unless clinically contraindicated. CRITICAL RESULT CALLED TO, READ BACK BY AND VERIFIED WITH: CHARLES SHANLEVER AT 0804 ON 07/30/2018 [place].    Methicillin resistance NOT DETECTED NOT DETECTED Final   Streptococcus species NOT DETECTED NOT DETECTED Final   Streptococcus agalactiae NOT DETECTED NOT DETECTED Final   Streptococcus pneumoniae NOT DETECTED NOT DETECTED Final   Streptococcus pyogenes NOT DETECTED NOT DETECTED Final   Acinetobacter baumannii NOT DETECTED NOT DETECTED Final   Enterobacteriaceae species NOT DETECTED NOT DETECTED Final   Enterobacter cloacae complex NOT DETECTED NOT DETECTED Final   Escherichia coli NOT DETECTED NOT DETECTED Final   Klebsiella oxytoca NOT DETECTED NOT DETECTED Final   Klebsiella pneumoniae NOT DETECTED NOT DETECTED Final   Proteus species NOT DETECTED NOT DETECTED Final   Serratia marcescens NOT DETECTED NOT DETECTED Final   Haemophilus influenzae NOT DETECTED NOT DETECTED Final   Neisseria meningitidis NOT DETECTED NOT DETECTED Final   Pseudomonas aeruginosa NOT DETECTED NOT DETECTED Final   Candida albicans NOT DETECTED NOT DETECTED Final   Candida glabrata NOT DETECTED  NOT DETECTED Final   Candida krusei NOT DETECTED NOT DETECTED Final   Candida parapsilosis NOT DETECTED NOT DETECTED Final   Candida tropicalis NOT DETECTED NOT DETECTED Final    Comment: Performed at [facility], [address] [place], [place] [address]  SARS Coronavirus 2 (CEPHEID - Performed in [facility] lab), Hosp Order     Status: None   Collection Time: 07/29/18  6:59 PM   Specimen: Nasopharyngeal Swab  Result Value Ref Range Status   SARS Coronavirus 2 NEGATIVE NEGATIVE Final    Comment: (NOTE) If result is NEGATIVE SARS-CoV-2 target nucleic acids are NOT DETECTED. The SARS-CoV-2 RNA is generally  detectable in upper and lower  respiratory specimens during the acute phase of infection. The lowest  concentration of SARS-CoV-2 viral copies this assay can detect is 250  copies / mL. A negative result does not preclude SARS-CoV-2 infection  and should not be used as the sole basis for treatment or other  patient management decisions.  A negative result may occur with  improper specimen collection / handling, submission of specimen other  than nasopharyngeal swab, presence of viral mutation(s) within the  areas targeted by this assay, and inadequate number of viral copies  (<250 copies / mL). A negative result must be combined with clinical  observations, patient history, and epidemiological information. If result is POSITIVE SARS-CoV-2 target nucleic acids are DETECTED. The SARS-CoV-2 RNA is generally detectable in upper and lower  respiratory specimens dur ing the acute phase of infection.  Positive  results are indicative of active infection with SARS-CoV-2.  Clinical  correlation with patient history and other diagnostic information is  necessary to determine patient infection status.  Positive results do  not rule out bacterial infection or co-infection with other viruses. If result is PRESUMPTIVE POSTIVE SARS-CoV-2 nucleic acids MAY BE PRESENT.   A  presumptive positive result was obtained on the submitted specimen  and confirmed on repeat testing.  While 2019 novel coronavirus  (SARS-CoV-2) nucleic acids may be present in the submitted sample  additional confirmatory testing may be necessary for epidemiological  and / or clinical management purposes  to differentiate between  SARS-CoV-2 and other Sarbecovirus currently known to infect humans.  If clinically indicated additional testing with an alternate test  methodology [phone]) is advised. The SARS-CoV-2 RNA is generally  detectable in upper and lower respiratory sp ecimens during the acute  phase of infection. The expected result is Negative. Fact Sheet for Patients:  StrictlyIdeas.no Fact Sheet for Healthcare Providers: BankingDealers.co.za This test is not yet approved or cleared by the [place] FDA and has been authorized for detection and/or diagnosis of SARS-CoV-2 by FDA under an Emergency Use Authorization (EUA).  This EUA will remain in effect (meaning this test can be used) for the duration of the COVID-19 declaration under Section 564(b)(1) of the Act, 21 U.S.C. section 360bbb-3(b)(1), unless the authorization is terminated or revoked sooner. Performed at [facility], [address] [place], [place] [address]   Cath Tip Culture     Status: Abnormal (Preliminary result)   Collection Time: 07/30/18  4:31 PM   Specimen: Catheter Tip; Other  Result Value Ref Range Status   Specimen Description   Final    CATH TIP Performed at [facility], [address], [place], [place] [address]    Special Requests   Final    NONE Performed at [facility], [address], [place], [place] [address]    Culture (A)  Final    STAPHYLOCOCCUS AUREUS SUSCEPTIBILITIES TO FOLLOW Performed at [facility], [place] [address], [place], [place] [address]    Report Status PENDING  Incomplete     Coagulation Studies: No results for input(s): LABPROT, INR in the last 72 hours.  Urinalysis: Recent Labs    07/29/18 2137  COLORURINE YELLOW*  LABSPEC 1.013  PHURINE 6.0  GLUCOSEU NEGATIVE  HGBUR SMALL*  BILIRUBINUR NEGATIVE  KETONESUR NEGATIVE  PROTEINUR NEGATIVE  NITRITE NEGATIVE  LEUKOCYTESUR NEGATIVE      Imaging: Dg Chest Portable 1 View  Result Date: 07/29/2018 CLINICAL DATA:  Recent pneumonia.  HIV disease. EXAM: PORTABLE  CHEST 1 VIEW COMPARISON:  July 07, 2018 FINDINGS: Endotracheal tube and nasogastric tube have been removed. No pneumothorax. There is patchy airspace opacity in the right lower lobe with small right pleural effusion. Lungs elsewhere are clear. Heart size and pulmonary vascularity are normal. No adenopathy. No bone lesions. IMPRESSION: Patchy infiltrate right base with small right pleural effusion. There is less consolidation in this area compared to most recent study. Lungs elsewhere clear. Heart size normal. No evident adenopathy. Electronically Signed   By: Lowella Grip III M.D.   On: 07/29/2018 16:24     Medications:   .  ceFAZolin (ANCEF) IV 2 g (07/31/18 9629)   . collagenase   Topical Daily  . dronabinol  2.5 mg Oral BID AC  . feeding supplement (ENSURE ENLIVE)  237 mL Oral TID BM  . multivitamin-lutein  1 capsule Oral Daily   acetaminophen **OR** acetaminophen, albuterol, bisacodyl, guaiFENesin, HYDROcodone-acetaminophen, ondansetron **OR** ondansetron (ZOFRAN) IV, senna-docusate  Assessment/ Plan:  Joshua Dougherty is a 48 y.o. black male HIV currently not on treatment, tuberculosis, history of abscess drainage, who was admitted to [facility] on 07/01/2018 for evaluation of altered mental status and unresponsiveness. Discharged to Hospice. Returns with pneumonia.   Patient was on CRRT from 6/10-6/12. Then intermittent hemodialysis on 6/13 and 6/16.   1.  Acute renal failure : baseline creatinine of 0.65, GFR of >60 in 03/21/2018 Renal  ultrasound consistent with HIV-associated nephropathy Underlying proteinuria 2.  Hyponatremia 3. Hyperkalemia 4. Anemia 5. Metabolic acidosis  Plan - Appreciate ID input.  - Discontinue IV fluids. Encourage PO intake.    LOS: 2 Ota Ebersole 7/9/20201:48 PM

## 2018-08-01 ENCOUNTER — Encounter: Payer: Self-pay | Admitting: *Deleted

## 2018-08-01 ENCOUNTER — Inpatient Hospital Stay
Admit: 2018-08-01 | Discharge: 2018-08-01 | Disposition: A | Discharge status: Home / Self Care | Payer: Medicaid Other | Attending: Cardiology | Admitting: Cardiology

## 2018-08-01 ENCOUNTER — Encounter
Admission: EM | Disposition: A | Discharge status: Home Health | Payer: Self-pay | Source: Home / Self Care | Attending: Internal Medicine

## 2018-08-01 ENCOUNTER — Inpatient Hospital Stay: Payer: Medicaid Other

## 2018-08-01 HISTORY — PX: TEE WITHOUT CARDIOVERSION: SHX5443

## 2018-08-01 LAB — CBC
HCT: 23.7 % — ABNORMAL LOW (ref 39.0–52.0)
Hemoglobin: 8.2 g/dL — ABNORMAL LOW (ref 13.0–17.0)
MCH: 32 pg (ref 26.0–34.0)
MCHC: 34.6 g/dL (ref 30.0–36.0)
MCV: 92.6 fL (ref 80.0–100.0)
Platelets: 207 10*3/uL (ref 150–400)
RBC: 2.56 MIL/uL — ABNORMAL LOW (ref 4.22–5.81)
RDW: 15.4 % (ref 11.5–15.5)
WBC: 6.9 10*3/uL (ref 4.0–10.5)
nRBC: 0 % (ref 0.0–0.2)

## 2018-08-01 LAB — CULTURE, BLOOD (ROUTINE X 2)
Special Requests: ADEQUATE
Special Requests: ADEQUATE

## 2018-08-01 LAB — COMPREHENSIVE METABOLIC PANEL
ALT: 14 U/L (ref 0–44)
AST: 35 U/L (ref 15–41)
Albumin: 1.7 g/dL — ABNORMAL LOW (ref 3.5–5.0)
Alkaline Phosphatase: 168 U/L — ABNORMAL HIGH (ref 38–126)
Anion gap: 5 (ref 5–15)
BUN: 19 mg/dL (ref 6–20)
CO2: 23 mmol/L (ref 22–32)
Calcium: 7.4 mg/dL — ABNORMAL LOW (ref 8.9–10.3)
Chloride: 100 mmol/L (ref 98–111)
Creatinine, Ser: 0.8 mg/dL (ref 0.61–1.24)
GFR calc Af Amer: >60 mL/min (ref >60)
GFR calc non Af Amer: >60 mL/min (ref >60)
Glucose, Bld: 144 mg/dL — ABNORMAL HIGH (ref 70–99)
Potassium: 3.8 mmol/L (ref 3.5–5.1)
Sodium: 128 mmol/L — ABNORMAL LOW (ref 135–145)
Total Bilirubin: 0.7 mg/dL (ref 0.3–1.2)
Total Protein: 5.5 g/dL — ABNORMAL LOW (ref 6.5–8.1)

## 2018-08-01 LAB — CATH TIP CULTURE

## 2018-08-01 LAB — MAGNESIUM: Magnesium: 1.6 mg/dL — ABNORMAL LOW (ref 1.7–2.4)

## 2018-08-01 LAB — ACTH: C206 ACTH: 72.1 pg/mL — ABNORMAL HIGH (ref 7.2–63.3)

## 2018-08-01 LAB — TOXOPLASMA ANTIBODIES- IGG AND  IGM
Toxoplasma Antibody- IgM: <3 AU/mL (ref 0.0–7.9)
Toxoplasma IgG Ratio: <3 IU/mL (ref 0.0–7.1)

## 2018-08-01 SURGERY — ECHOCARDIOGRAM, TRANSESOPHAGEAL
Anesthesia: Moderate Sedation

## 2018-08-01 MED ORDER — FENTANYL CITRATE (PF) 100 MCG/2ML IJ SOLN
INTRAMUSCULAR | Status: AC | PRN
  Administered 2018-08-01 (×3): 25 ug via INTRAVENOUS
  Administered 2018-08-01: 50 ug via INTRAVENOUS
  Administered 2018-08-01: 25 ug via INTRAVENOUS

## 2018-08-01 MED ORDER — MIDAZOLAM HCL 5 MG/5ML IJ SOLN
INTRAMUSCULAR | Status: AC
  Filled 2018-08-01: qty 5

## 2018-08-01 MED ORDER — BUTAMBEN-TETRACAINE-BENZOCAINE 2-2-14 % EX AERO
INHALATION_SPRAY | CUTANEOUS | Status: AC
  Filled 2018-08-01: qty 5

## 2018-08-01 MED ORDER — SODIUM CHLORIDE 0.9 % IV SOLN
INTRAVENOUS | Status: DC
  Administered 2018-08-01: 08:00:00 via INTRAVENOUS

## 2018-08-01 MED ORDER — MAGNESIUM SULFATE 2 GM/50ML IV SOLN
2.0000 g | Freq: Once | INTRAVENOUS | Status: AC
  Administered 2018-08-01: 2 g via INTRAVENOUS
  Filled 2018-08-01: qty 50

## 2018-08-01 MED ORDER — FENTANYL CITRATE (PF) 100 MCG/2ML IJ SOLN
INTRAMUSCULAR | Status: AC
  Filled 2018-08-01: qty 2

## 2018-08-01 MED ORDER — SODIUM CHLORIDE FLUSH 0.9 % IV SOLN
INTRAVENOUS | Status: AC
  Filled 2018-08-01: qty 10

## 2018-08-01 MED ORDER — MIDAZOLAM HCL 5 MG/5ML IJ SOLN
INTRAMUSCULAR | Status: AC | PRN
  Administered 2018-08-01: 2 mg via INTRAVENOUS
  Administered 2018-08-01 (×4): 1 mg via INTRAVENOUS

## 2018-08-01 MED ORDER — SODIUM CHLORIDE 0.9 % IV SOLN: INTRAVENOUS | Status: DC

## 2018-08-01 MED ORDER — LIDOCAINE VISCOUS HCL 2 % MT SOLN
OROMUCOSAL | Status: AC
  Filled 2018-08-01: qty 15

## 2018-08-01 NOTE — Progress Notes (Signed)
Attempt to sedate patient today with Versed and fentanyl 250 mg of fentanyl and 7 mg of Versed.  Patient was not able to be sedated.  Not able to pass the probe due to fighting intubation.  TEE was canceled.  Will reschedule for Monday with anesthesia assistance.

## 2018-08-01 NOTE — Progress Notes (Signed)
[place] [facility] [place] NAME: Joshua Dougherty    MR#:  [id number]  DATE OF BIRTH:  11-09-1970  SUBJECTIVE:  CHIEF COMPLAINT:   Chief Complaint  Patient presents with  . Chronic Renal Failure   No new complaints.  No fevers overnight.  TEE was attempted today and patient could not be sedated with Versed and fentanyl as such unable to pass probe due to patient fighting intubation.  TEE canceled and rescheduled for Monday with anesthesia. Patient back in the room with no complaints at this time.   REVIEW OF SYSTEMS:  Review of Systems  Constitutional: Positive for weight loss. Negative for chills and fever.  HENT: Negative for hearing loss and tinnitus.   Eyes: Negative for blurred vision and double vision.  Respiratory: Negative for cough and shortness of breath.   Cardiovascular: Negative for chest pain and palpitations.  Gastrointestinal: Negative for heartburn, nausea and vomiting.  Genitourinary: Negative for dysuria.  Musculoskeletal: Negative for myalgias and neck pain.  Skin: Negative for itching and rash.  Neurological: Negative for dizziness and headaches.  Psychiatric/Behavioral: Negative for depression and hallucinations.    DRUG ALLERGIES:  No Known Allergies VITALS:  Blood pressure 104/74, pulse 75, temperature 98.4 F (36.9 C), temperature source Oral, resp. rate 16, height 6\' 4"  (1.93 m), weight 68 kg, SpO2 99 %. PHYSICAL EXAMINATION:   Physical Exam  Constitutional: He is oriented to person, place, and time. He appears well-developed.  Thin  HENT:  Head: Normocephalic and atraumatic.  Right Ear: External ear normal.  Eyes: Pupils are equal, round, and reactive to light. Conjunctivae are normal. Right eye exhibits no discharge.  Neck: Normal range of motion.  Cardiovascular: Normal rate, regular rhythm and normal heart sounds.  Respiratory: Effort normal. He has rales.  GI: Soft. Bowel sounds are normal. He exhibits  no distension.  Musculoskeletal: Normal range of motion.        General: No edema.  Neurological: He is alert and oriented to person, place, and time. No cranial nerve deficit.  Skin: Skin is warm. He is not diaphoretic. No erythema.  Psychiatric: He has a normal mood and affect. His behavior is normal.     LABORATORY PANEL:  Male CBC Recent Labs  Lab 08/01/18 0042  WBC 6.9  HGB 8.2*  HCT 23.7*  PLT 207   ------------------------------------------------------------------------------------------------------------------ Chemistries  Recent Labs  Lab 08/01/18 0042  NA 128*  K 3.8  CL 100  CO2 23  GLUCOSE 144*  BUN 19  CREATININE 0.80  CALCIUM 7.4*  MG 1.6*  AST 35  ALT 14  ALKPHOS 168*  BILITOT 0.7   RADIOLOGY:  No results found. ASSESSMENT AND PLAN:   1. Right lower lobe neumonia with small pleural effusion Patient initially placed empirically on IV antibiotics initially with cefepime and azithromycin.  MSSA bacteremia ;blood cultures growing staph aureus.  Antibiotics later changed to cefazolin based on cultures and discussion with pharmacy.  Hemodialysis catheter appears to have been left in place when patient was recently discharged to hospice.  This has been discontinued.  Culture of catheter tip also growing staph aureus.  Hemodialysis catheter already previously discontinued. IV fluid hydration.  PRN Robitussin.  Monitor clinically. Patient seen by infectious disease specialist.  Recommended TEE which was attempted today but patient could not be adequately sedated.  TEE canceled and rescheduled to be done on Monday with anesthesia. Infectious disease specialist also recommended imaging of the spine.  Requested for  CT of spine today.  2.  Hyponatremia; sodium level improved from 127-131.  3.  Hyperkalemia with potassium of 6.0 Resolved.  4.  Acute kidney injury; renal function improved Renal ultrasound consistent with HIV-associated nephropathy.  Nephrology  following.  No indication for hemodialysis at this time.   5.  History of HIV.   Seen by infectious disease specialist.  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  Patient is interested in starting treatment.  He does not have insurance.    ID plans to start him on antiretroviral during this hospitalization.    Sent genotype. PCP prophylaxis with atovaquone  6. Anemia of chronic disease. Noted drop in hemoglobin to 6.6 recently.  Transfused with 1 unit of packed red blood cells with improvement in hemoglobin to 8.2. Clinically no evidence of bleeding.  7.  Unstageable sacral pressure injury. Present on admission.  Wound care following.  DVT prophylaxis; SCDs for now.  Avoiding heparin products due to anemia requiring packed red blood cell transfusion.  All the records are reviewed and case discussed with Care Management/Social Worker. Management plans discussed with the patient, and he is in agreement. I offered to call family but patient declined and stated he has been updating his family daily.  CODE STATUS: Full Code  TOTAL TIME TAKING CARE OF THIS PATIENT: 34 minutes.   More than 50% of the time was spent in counseling/coordination of care: YES  POSSIBLE D/C IN 2 DAYS, DEPENDING ON CLINICAL CONDITION.   Bridgitt Raggio M.D on 08/01/2018 at 1:16 PM  Between 7am to 6pm - Pager - [phone]  After 6pm go to [email or web address] - Proofreader  [facility]  Office  [phone]  CC: Primary care physician; Patient, No Pcp Per  Note: This dictation was prepared with Dragon dictation along with smaller phrase technology. Any transcriptional errors that result from this process are unintentional.

## 2018-08-01 NOTE — Progress Notes (Addendum)
Daily Progress Note   Patient Name: Joshua Dougherty       Date: 08/01/2018 DOB: 1970/10/19  Age: 48 y.o. MRN#: [id number] Attending Physician: Otila Back, MD Primary Care Physician: Patient, No Pcp Per Admit Date: 07/29/2018  Reason for Consultation/Follow-up: Psychosocial/spiritual support  Subjective: Patient is resting in bed. He states he is feeling better each day. He states he is happy for all the care he is receiving. He states working with PT went well, but states he is deconditioned and was unable to walk up the steps. He states he is amenable to going to SNF if it were recommended. He states his home and possessions remained in place during his admission to hospice, so that he will return to his life as it was before.  We discussed importance of nutrition. He is hopeful to increase his appetite.   Marinol order in place. Would recommend transitioning this to Megace or Remeron at low dose such as 7.5mg .  Length of Stay: 3  Current Medications: Scheduled Meds:  . butamben-tetracaine-benzocaine      . fentaNYL      . lidocaine      . midazolam      . sodium chloride flush      . atovaquone  1,500 mg Oral Q breakfast  . collagenase   Topical Daily  . dronabinol  2.5 mg Oral BID AC  . feeding supplement (ENSURE ENLIVE)  237 mL Oral TID BM  . multivitamin-lutein  1 capsule Oral Daily    Continuous Infusions: . sodium chloride 20 mL/hr at 08/01/18 0929  .  ceFAZolin (ANCEF) IV Stopped (08/01/18 0625)    PRN Meds: acetaminophen **OR** acetaminophen, albuterol, bisacodyl, guaiFENesin, HYDROcodone-acetaminophen, ondansetron **OR** ondansetron (ZOFRAN) IV, senna-docusate  Physical Exam Pulmonary:     Effort: Pulmonary effort is normal.  Skin:    General: Skin is warm and dry.   Neurological:     Mental Status: He is alert.             Vital Signs: BP 116/72 (BP Location: Left Arm)   Pulse 69   Temp 98.4 F (36.9 C) (Oral)   Resp 20   Ht 6\' 4"  (1.93 m)   Wt 68 kg   SpO2 100%   BMI 18.25 kg/m  SpO2: SpO2: 100 % O2 Device: O2 Device:  Room Air O2 Flow Rate:    Intake/output summary:   Intake/Output Summary (Last 24 hours) at 08/01/2018 0955 Last data filed at 08/01/2018 2951 Gross per 24 hour  Intake 1303.19 ml  Output 1150 ml  Net 153.19 ml   LBM: Last BM Date: 07/31/18 Baseline Weight: Weight: 68 kg Most recent weight: Weight: 68 kg       Palliative Assessment/Data:      Patient Active Problem List   Diagnosis Date Noted  . Pressure injury of skin 07/30/2018  . Pneumonia 07/29/2018  . HIV-associated nephropathy ([place]) 07/08/2018  . CAP (community acquired pneumonia) 07/01/2018  . Protein-calorie malnutrition, severe 03/19/2018  . Hemoptysis 03/18/2018  . Tobacco abuse 05/20/2014  . Asymptomatic HIV infection ([place]) 05/20/2014    Palliative Care Assessment & Plan    Recommendations/Plan:  -- Continue full code/full scope.  --Recommend changing Marinol to Megace or Remeron at low dose such as 7.5mg  for cost and side effects.  --Recommend palliative at D/C.   Code Status:    Code Status Orders  (From admission, onward)         Start     Ordered   07/29/18 2137  Full code  Continuous     07/29/18 2136        Code Status History    Date Active Date Inactive Code Status Order ID Comments User Context   07/01/2018 8841 07/10/2018 1504 DNR [id number]  Tyler Pita, MD Inpatient   07/01/2018 0425 07/01/2018 1634 Full Code [id number]  Mansy, Arvella Merles, MD ED   03/18/2018 0510 03/21/2018 1525 Full Code [id number]  Harrie Foreman, MD Inpatient   Advance Care Planning Activity       Prognosis:   Unable to determine  Discharge Planning:  To Be Determined   Thank you for allowing the Palliative Medicine Team to assist in  the care of this patient.   Total Time 15 min Prolonged Time Billed  no      Greater than 50%  of this time was spent counseling and coordinating care related to the above assessment and plan.  Asencion Gowda, NP  Please contact Palliative Medicine Team phone at [phone] for questions and concerns.

## 2018-08-02 DIAGNOSIS — B9561 Methicillin susceptible Staphylococcus aureus infection as the cause of diseases classified elsewhere: Secondary | ICD-10-CM | POA: Diagnosis present

## 2018-08-02 DIAGNOSIS — R7881 Bacteremia: Secondary | ICD-10-CM | POA: Diagnosis present

## 2018-08-02 LAB — CBC
HCT: 22.6 % — ABNORMAL LOW (ref 39.0–52.0)
Hemoglobin: 7.7 g/dL — ABNORMAL LOW (ref 13.0–17.0)
MCH: 31.3 pg (ref 26.0–34.0)
MCHC: 34.1 g/dL (ref 30.0–36.0)
MCV: 91.9 fL (ref 80.0–100.0)
Platelets: 209 10*3/uL (ref 150–400)
RBC: 2.46 MIL/uL — ABNORMAL LOW (ref 4.22–5.81)
RDW: 15.1 % (ref 11.5–15.5)
WBC: 7.2 10*3/uL (ref 4.0–10.5)
nRBC: 0 % (ref 0.0–0.2)

## 2018-08-02 LAB — BASIC METABOLIC PANEL
Anion gap: 8 (ref 5–15)
BUN: 16 mg/dL (ref 6–20)
CO2: 22 mmol/L (ref 22–32)
Calcium: 7.7 mg/dL — ABNORMAL LOW (ref 8.9–10.3)
Chloride: 102 mmol/L (ref 98–111)
Creatinine, Ser: 0.79 mg/dL (ref 0.61–1.24)
GFR calc Af Amer: >60 mL/min (ref >60)
GFR calc non Af Amer: >60 mL/min (ref >60)
Glucose, Bld: 116 mg/dL — ABNORMAL HIGH (ref 70–99)
Potassium: 3.8 mmol/L (ref 3.5–5.1)
Sodium: 132 mmol/L — ABNORMAL LOW (ref 135–145)

## 2018-08-02 LAB — FUNGITELL, SERUM: Fungitell Result: <31 pg/mL (ref <80)

## 2018-08-02 LAB — MAGNESIUM: Magnesium: 1.8 mg/dL (ref 1.7–2.4)

## 2018-08-02 NOTE — Progress Notes (Signed)
Physical Therapy Treatment Patient Details Name: Joshua Dougherty MRN: [id number] DOB: 12-23-1970 Today's Date: 08/02/2018    History of Present Illness presented to ER secondary to renal failure; admitted for management of PNA, hyponatremia, hypokalemia (now corrected). Of note, patient has been at hospice home for end-of-life care prior to this admission (declining dialysis), recently revoked hospice benefit and transitioned to full care/code.  L groin dialysis cath removed previous date.    PT Comments    Initial attempt, pt eating late lunch and requested later return. Second attempt, pt agreeable to PT. Denies pain. Pt demonstrating improved distance with ambulation using rolling walker and Min guard primarily; pt did have 2 episodes of scissoring gait, 1 with need for Min A to correct LOB. Pt fatigued post ambulation, but was able to participate in 10 repetitions of several basic exercises. Other exercises were verbally educated to the pt for later performance. Overall, pt improving activity tolerance, but continues very limited by fatigue and weakness. Continue PT to progress strength and endurance to improve functional mobility.    Follow Up Recommendations  Home health PT     Equipment Recommendations  Rolling walker with 5" wheels;Hospital bed    Recommendations for Other Services       Precautions / Restrictions Precautions Precautions: Fall Restrictions Weight Bearing Restrictions: No    Mobility  Bed Mobility Overal bed mobility: Modified Independent             General bed mobility comments: use of rails and mild increased time  Transfers Overall transfer level: Needs assistance Equipment used: Rolling walker (2 wheeled) Transfers: Sit to/from Stand Sit to Stand: Supervision;From elevated surface         General transfer comment: Requires elevated surface to hips above knees to stand without assist. Would require assist from a  chair  Ambulation/Gait Ambulation/Gait assistance: Min guard;Min assist Gait Distance (Feet): 220 Feet Assistive device: Rolling walker (2 wheeled) Gait Pattern/deviations: Step-through pattern;Scissoring Gait velocity: decreased   General Gait Details: Generally steady, no buckling. 2 episodes of scissoring with 1 requiring Min A to correct. Fatigued post end of walk   Marine scientist [place] (Stroke Patients Only)       Balance Overall balance assessment: Needs assistance Sitting-balance support: Feet supported;Bilateral upper extremity supported Sitting balance-Leahy Scale: Good     Standing balance support: Bilateral upper extremity supported Standing balance-Leahy Scale: Fair                              Cognition Arousal/Alertness: Awake/alert Behavior During Therapy: WFL for tasks assessed/performed Overall Cognitive Status: Within Functional Limits for tasks assessed                                        Exercises General Exercises - Lower Extremity Ankle Circles/Pumps: AROM;Both;10 reps Long Arc Quad: Strengthening;Both;10 reps;Seated Heel Slides: Other (comment)(verbal education; pt too tired to perform) Hip ABduction/ADduction: Strengthening;Both;10 reps;Seated Straight Leg Raises: Other (comment)(verbal education; pt too tired to perform) Hip Flexion/Marching: Other (comment)(verbal education; pt too tired to perform)    General Comments        Pertinent Vitals/Pain Pain Assessment: No/denies pain    Home Living  Prior Function            PT Goals (current goals can now be found in the care plan section) Progress towards PT goals: Progressing toward goals    Frequency    Min 2X/week      PT Plan Current plan remains appropriate    Co-evaluation              AM-PAC PT "6 Clicks" Mobility   Outcome Measure  Help needed turning  from your back to your side while in a flat bed without using bedrails?: None Help needed moving from lying on your back to sitting on the side of a flat bed without using bedrails?: A Little Help needed moving to and from a bed to a chair (including a wheelchair)?: None Help needed standing up from a chair using your arms (e.g., wheelchair or bedside chair)?: A Little Help needed to walk in hospital room?: A Little Help needed climbing 3-5 steps with a railing? : A Little 6 Click Score: 20    End of Session Equipment Utilized During Treatment: Gait belt Activity Tolerance: Patient tolerated treatment well;Patient limited by fatigue Patient left: in bed;with call bell/phone within reach;with bed alarm set   PT Visit Diagnosis: Difficulty in walking, not elsewhere classified (R26.2);Muscle weakness (generalized) (M62.81)     Time: 2761-4709 PT Time Calculation (min) (ACUTE ONLY): 33 min  Charges:  $Gait Training: 8-22 mins $Therapeutic Exercise: 8-22 mins                      Larae Grooms, PTA 08/02/2018, 3:32 PM

## 2018-08-02 NOTE — Progress Notes (Signed)
[facility] [place] NAME: Joshua Dougherty    MR#:  [id number]  DATE OF BIRTH:  Dec 05, 1970  SUBJECTIVE:  CHIEF COMPLAINT:   Chief Complaint  Patient presents with   Chronic Renal Failure   Feels fatigued.  No pain.  Slept well.  Afebrile.  REVIEW OF SYSTEMS:  Review of Systems  Constitutional: Positive for weight loss. Negative for chills and fever.  HENT: Negative for hearing loss and tinnitus.   Eyes: Negative for blurred vision and double vision.  Respiratory: Negative for cough and shortness of breath.   Cardiovascular: Negative for chest pain and palpitations.  Gastrointestinal: Negative for heartburn, nausea and vomiting.  Genitourinary: Negative for dysuria.  Musculoskeletal: Negative for myalgias and neck pain.  Skin: Negative for itching and rash.  Neurological: Negative for dizziness and headaches.  Psychiatric/Behavioral: Negative for depression and hallucinations.    DRUG ALLERGIES:  No Known Allergies VITALS:  Blood pressure 126/81, pulse 81, temperature 99.8 F (37.7 C), resp. rate 17, height 6\' 4"  (1.93 m), weight 68 kg, SpO2 99 %. PHYSICAL EXAMINATION:   Physical Exam  Constitutional: He is oriented to person, place, and time. He appears well-developed.  Thin  HENT:  Head: Normocephalic and atraumatic.  Right Ear: External ear normal.  Eyes: Pupils are equal, round, and reactive to light. Conjunctivae are normal. Right eye exhibits no discharge.  Neck: Normal range of motion.  Cardiovascular: Normal rate, regular rhythm and normal heart sounds.  Respiratory: Effort normal. He has rales.  GI: Soft. Bowel sounds are normal. He exhibits no distension.  Musculoskeletal: Normal range of motion.        General: No edema.  Neurological: He is alert and oriented to person, place, and time. No cranial nerve deficit.  Skin: Skin is warm. He is not diaphoretic. No erythema.  Psychiatric: He has a normal mood and  affect. His behavior is normal.     LABORATORY PANEL:  Male CBC Recent Labs  Lab 08/02/18 0437  WBC 7.2  HGB 7.7*  HCT 22.6*  PLT 209   ------------------------------------------------------------------------------------------------------------------ Chemistries  Recent Labs  Lab 08/01/18 0042 08/02/18 0437  NA 128* 132*  K 3.8 3.8  CL 100 102  CO2 23 22  GLUCOSE 144* 116*  BUN 19 16  CREATININE 0.80 0.79  CALCIUM 7.4* 7.7*  MG 1.6* 1.8  AST 35  --   ALT 14  --   ALKPHOS 168*  --   BILITOT 0.7  --    RADIOLOGY:  Ct Cervical Spine Wo Contrast  Result Date: 08/01/2018 CLINICAL DATA:  48 year old male with bacteremia. EXAM: CT CERVICAL, THORACIC, AND LUMBAR SPINE WITHOUT CONTRAST TECHNIQUE: Multidetector CT imaging of the cervical, thoracic and lumbar spine was performed without intravenous contrast. Multiplanar CT image reconstructions were also generated. COMPARISON:  Chest CT 07/04/2018. Head CT without contrast 07/07/2018. FINDINGS: CT CERVICAL SPINE FINDINGS Alignment: Straightening of cervical lordosis. No spondylolisthesis. Cervicothoracic junction alignment is within normal limits. Bilateral posterior element alignment is within normal limits. Skull base and vertebrae: Visualized skull base is intact. No atlanto-occipital dissociation. No acute osseous abnormality identified. Cervical endplates are intact. Soft tissues and spinal canal: Paucity of fat planes in the neck. Negative noncontrast CT appearance of the cervical spinal canal and paraspinal soft tissues. Decreased attenuation of vascular structures suggesting anemia. Mild calcified plaque. Stable visible posterior fossa. Mastoids and tympanic cavities remain clear. Disc levels: Negative aside from mild disc bulging. There is anterior endplate spurring  at C6-C7 and C7-T1. No cervical spinal stenosis suspected. CT THORACIC SPINE FINDINGS Segmentation: Appears normal. Alignment: Stable thoracic kyphosis since June. No  spondylolisthesis. Vertebrae: No acute osseous abnormality identified. Thoracic endplates appear intact. Paraspinal and other soft tissues: Centrilobular emphysema. Substantially improved right lower lobe ventilation since the 07/04/2018 CT although there is a residual cavitary lung lesion on series 3, image 112. Right pleural effusion appears resolved. Superimposed dependent atelectasis or scarring. No pericardial effusion. Negative noncontrast appearance of the paraspinal soft tissues. Disc levels: Negative for age. Anterior disc bulging and endplate spurring at K16-W10. CT LUMBAR SPINE FINDINGS Segmentation: Normal. Alignment: Straightening of lumbar lordosis. Mild degenerative appearing retrolisthesis of L5 on S1. Vertebrae: No acute osseous abnormality identified. Lumbar endplates are intact. Intact visible sacrum and SI joints. Paraspinal and other soft tissues: Paucity of fat planes in the abdomen, similar to the neck. Partially visible enteric contents in the left abdomen. Negative other visible noncontrast abdominal viscera. Negative noncontrast CT appearance of the paraspinal soft tissues. Disc levels: Negative aside from mild disc bulging. There is anterior endplate spurring at X3-A3. IMPRESSION: 1. No acute osseous abnormality and negative for age noncontrast CT appearance of the cervical, thoracic, and lumbar spine. 2. Substantially improved right lung ventilation since June with residual right lower lobe cavitary lesion. 3. Hypodensity of vascular structures suggesting anemia. 4. Emphysema (ICD10-J43.9). Electronically Signed   By: Genevie Ann M.D.   On: 08/01/2018 16:00   Ct Thoracic Spine Wo Contrast  Result Date: 08/01/2018 CLINICAL DATA:  48 year old male with bacteremia. EXAM: CT CERVICAL, THORACIC, AND LUMBAR SPINE WITHOUT CONTRAST TECHNIQUE: Multidetector CT imaging of the cervical, thoracic and lumbar spine was performed without intravenous contrast. Multiplanar CT image reconstructions were  also generated. COMPARISON:  Chest CT 07/04/2018. Head CT without contrast 07/07/2018. FINDINGS: CT CERVICAL SPINE FINDINGS Alignment: Straightening of cervical lordosis. No spondylolisthesis. Cervicothoracic junction alignment is within normal limits. Bilateral posterior element alignment is within normal limits. Skull base and vertebrae: Visualized skull base is intact. No atlanto-occipital dissociation. No acute osseous abnormality identified. Cervical endplates are intact. Soft tissues and spinal canal: Paucity of fat planes in the neck. Negative noncontrast CT appearance of the cervical spinal canal and paraspinal soft tissues. Decreased attenuation of vascular structures suggesting anemia. Mild calcified plaque. Stable visible posterior fossa. Mastoids and tympanic cavities remain clear. Disc levels: Negative aside from mild disc bulging. There is anterior endplate spurring at F5-D3 and C7-T1. No cervical spinal stenosis suspected. CT THORACIC SPINE FINDINGS Segmentation: Appears normal. Alignment: Stable thoracic kyphosis since June. No spondylolisthesis. Vertebrae: No acute osseous abnormality identified. Thoracic endplates appear intact. Paraspinal and other soft tissues: Centrilobular emphysema. Substantially improved right lower lobe ventilation since the 07/04/2018 CT although there is a residual cavitary lung lesion on series 3, image 112. Right pleural effusion appears resolved. Superimposed dependent atelectasis or scarring. No pericardial effusion. Negative noncontrast appearance of the paraspinal soft tissues. Disc levels: Negative for age. Anterior disc bulging and endplate spurring at U20-U54. CT LUMBAR SPINE FINDINGS Segmentation: Normal. Alignment: Straightening of lumbar lordosis. Mild degenerative appearing retrolisthesis of L5 on S1. Vertebrae: No acute osseous abnormality identified. Lumbar endplates are intact. Intact visible sacrum and SI joints. Paraspinal and other soft tissues: Paucity  of fat planes in the abdomen, similar to the neck. Partially visible enteric contents in the left abdomen. Negative other visible noncontrast abdominal viscera. Negative noncontrast CT appearance of the paraspinal soft tissues. Disc levels: Negative aside from mild disc bulging. There is anterior endplate spurring  at L3-L4. IMPRESSION: 1. No acute osseous abnormality and negative for age noncontrast CT appearance of the cervical, thoracic, and lumbar spine. 2. Substantially improved right lung ventilation since June with residual right lower lobe cavitary lesion. 3. Hypodensity of vascular structures suggesting anemia. 4. Emphysema (ICD10-J43.9). Electronically Signed   By: Genevie Ann M.D.   On: 08/01/2018 16:00   Ct Lumbar Spine Wo Contrast  Result Date: 08/01/2018 CLINICAL DATA:  48 year old male with bacteremia. EXAM: CT CERVICAL, THORACIC, AND LUMBAR SPINE WITHOUT CONTRAST TECHNIQUE: Multidetector CT imaging of the cervical, thoracic and lumbar spine was performed without intravenous contrast. Multiplanar CT image reconstructions were also generated. COMPARISON:  Chest CT 07/04/2018. Head CT without contrast 07/07/2018. FINDINGS: CT CERVICAL SPINE FINDINGS Alignment: Straightening of cervical lordosis. No spondylolisthesis. Cervicothoracic junction alignment is within normal limits. Bilateral posterior element alignment is within normal limits. Skull base and vertebrae: Visualized skull base is intact. No atlanto-occipital dissociation. No acute osseous abnormality identified. Cervical endplates are intact. Soft tissues and spinal canal: Paucity of fat planes in the neck. Negative noncontrast CT appearance of the cervical spinal canal and paraspinal soft tissues. Decreased attenuation of vascular structures suggesting anemia. Mild calcified plaque. Stable visible posterior fossa. Mastoids and tympanic cavities remain clear. Disc levels: Negative aside from mild disc bulging. There is anterior endplate spurring  at V0-J5 and C7-T1. No cervical spinal stenosis suspected. CT THORACIC SPINE FINDINGS Segmentation: Appears normal. Alignment: Stable thoracic kyphosis since June. No spondylolisthesis. Vertebrae: No acute osseous abnormality identified. Thoracic endplates appear intact. Paraspinal and other soft tissues: Centrilobular emphysema. Substantially improved right lower lobe ventilation since the 07/04/2018 CT although there is a residual cavitary lung lesion on series 3, image 112. Right pleural effusion appears resolved. Superimposed dependent atelectasis or scarring. No pericardial effusion. Negative noncontrast appearance of the paraspinal soft tissues. Disc levels: Negative for age. Anterior disc bulging and endplate spurring at K09-F81. CT LUMBAR SPINE FINDINGS Segmentation: Normal. Alignment: Straightening of lumbar lordosis. Mild degenerative appearing retrolisthesis of L5 on S1. Vertebrae: No acute osseous abnormality identified. Lumbar endplates are intact. Intact visible sacrum and SI joints. Paraspinal and other soft tissues: Paucity of fat planes in the abdomen, similar to the neck. Partially visible enteric contents in the left abdomen. Negative other visible noncontrast abdominal viscera. Negative noncontrast CT appearance of the paraspinal soft tissues. Disc levels: Negative aside from mild disc bulging. There is anterior endplate spurring at W2-X9. IMPRESSION: 1. No acute osseous abnormality and negative for age noncontrast CT appearance of the cervical, thoracic, and lumbar spine. 2. Substantially improved right lung ventilation since June with residual right lower lobe cavitary lesion. 3. Hypodensity of vascular structures suggesting anemia. 4. Emphysema (ICD10-J43.9). Electronically Signed   By: Genevie Ann M.D.   On: 08/01/2018 16:00   ASSESSMENT AND PLAN:   * Right lower lobe neumonia with small pleural effusion Patient initially placed empirically on IV antibiotics initially with cefepime and  azithromycin.  Now on cefazolin  * MSSA bacteremia    On IV cefazolin Hemodialysis catheter appears to have been left in place when patient was recently discharged to hospice.  This has been removed.  Culture of catheter tip also growing staph aureus.  Patient seen by infectious disease.  Recommended TEE which was attempted Friday but patient could not be adequately sedated.  TEE canceled and rescheduled to be done on Monday with anesthesia. CT of the spine showed no signs of infection  *  Hyponatremia; sodium level improved from 127-131.  *  Hyperkalemia with potassium of 6.0 Resolved.  *  Acute kidney injury; renal function improved Renal ultrasound consistent with HIV-associated nephropathy.  Nephrology following.  No indication for hemodialysis at this time.   *  History of HIV.   Seen by infectious disease specialist.  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  Patient is interested in starting treatment.  He does not have insurance.  Sent genotype. PCP prophylaxis with atovaquone  * Anemia of chronic disease. Needed 1 unit packed RBC transfusion this admission  *  Unstageable sacral pressure injury. Present on admission.  Wound care following.  DVT prophylaxis; SCDs   All the records are reviewed and case discussed with Care Management/Social Worker. Management plans discussed with the patient, and he is in agreement.  CODE STATUS: Full Code  TOTAL TIME TAKING CARE OF THIS PATIENT: 35 minutes.   POSSIBLE D/C IN 2-3 DAYS, DEPENDING ON CLINICAL CONDITION.   Neita Carp M.D on 08/02/2018 at 12:52 PM  Between 7am to 6pm - Pager - [phone]  After 6pm go to [email or web address] - Proofreader  [facility]  Office  [phone]  CC: Primary care physician; Patient, No Pcp Per  Note: This dictation was prepared with Dragon dictation along with smaller phrase technology. Any transcriptional errors  that result from this process are unintentional.

## 2018-08-03 MED ORDER — SODIUM CHLORIDE 0.9 % IV SOLN
INTRAVENOUS | Status: DC | PRN
  Administered 2018-08-04: 23:00:00 250 mL via INTRAVENOUS
  Administered 2018-08-04: 08:00:00 via INTRAVENOUS

## 2018-08-03 MED ORDER — ENOXAPARIN SODIUM 40 MG/0.4ML ~~LOC~~ SOLN
40.0000 mg | SUBCUTANEOUS | Status: DC
  Filled 2018-08-03 (×3): qty 0.4

## 2018-08-03 NOTE — Progress Notes (Signed)
ID Saw patient yesterday  Delayed entry   He is feeling okay Started on marinol for poor appetite MSSA bacteremia -07/29/18 Repeat blood culture 08/01/18 NG so far  TEE could not be done on Friday- will be attempted on Monday with [place] MRI of the entire spine N- no infection On cefazolin Await TEE to decide on duration  AIDS- will have to be started on HAARt, but need to get approval from [facility] compassionate use as we do not want a break in rx

## 2018-08-03 NOTE — Progress Notes (Signed)
[facility] [place] NAME: Joshua Dougherty    MR#:  [id number]  DATE OF BIRTH:  November 20, 1970  SUBJECTIVE:  CHIEF COMPLAINT:   Chief Complaint  Patient presents with  . Chronic Renal Failure   Feels well.  Ambulates with a walker.  Afebrile.  REVIEW OF SYSTEMS:  Review of Systems  Constitutional: Positive for weight loss. Negative for chills and fever.  HENT: Negative for hearing loss and tinnitus.   Eyes: Negative for blurred vision and double vision.  Respiratory: Negative for cough and shortness of breath.   Cardiovascular: Negative for chest pain and palpitations.  Gastrointestinal: Negative for heartburn, nausea and vomiting.  Genitourinary: Negative for dysuria.  Musculoskeletal: Negative for myalgias and neck pain.  Skin: Negative for itching and rash.  Neurological: Negative for dizziness and headaches.  Psychiatric/Behavioral: Negative for depression and hallucinations.    DRUG ALLERGIES:  No Known Allergies VITALS:  Blood pressure 115/79, pulse 79, temperature 98.4 F (36.9 C), temperature source Oral, resp. rate 18, height 6\' 4"  (1.93 m), weight 68 kg, SpO2 100 %. PHYSICAL EXAMINATION:   Physical Exam  Constitutional: He is oriented to person, place, and time. He appears well-developed.  Thin  HENT:  Head: Normocephalic and atraumatic.  Right Ear: External ear normal.  Eyes: Pupils are equal, round, and reactive to light. Conjunctivae are normal. Right eye exhibits no discharge.  Neck: Normal range of motion.  Cardiovascular: Normal rate, regular rhythm and normal heart sounds.  Respiratory: Effort normal. He has rales.  GI: Soft. Bowel sounds are normal. He exhibits no distension.  Musculoskeletal: Normal range of motion.        General: No edema.  Neurological: He is alert and oriented to person, place, and time. No cranial nerve deficit.  Skin: Skin is warm. He is not diaphoretic. No erythema.  Psychiatric: He has  a normal mood and affect. His behavior is normal.     LABORATORY PANEL:  Male CBC Recent Labs  Lab 08/02/18 0437  WBC 7.2  HGB 7.7*  HCT 22.6*  PLT 209   ------------------------------------------------------------------------------------------------------------------ Chemistries  Recent Labs  Lab 08/01/18 0042 08/02/18 0437  NA 128* 132*  K 3.8 3.8  CL 100 102  CO2 23 22  GLUCOSE 144* 116*  BUN 19 16  CREATININE 0.80 0.79  CALCIUM 7.4* 7.7*  MG 1.6* 1.8  AST 35  --   ALT 14  --   ALKPHOS 168*  --   BILITOT 0.7  --    RADIOLOGY:  No results found. ASSESSMENT AND PLAN:   * MSSA bacteremia    On IV cefazolin Hemodialysis catheter appears to have been left in place when patient was recently discharged to hospice.  This has been removed.  Culture of catheter tip also growing staph aureus.  Patient seen by infectious disease.  Recommended TEE which was attempted Friday but patient could not be adequately sedated.  TEE canceled and rescheduled to be done on Monday with anesthesia. CT of the spine showed no signs of infection .  Blood cultures negative.  If TEE is negative will need IV antibiotics till 08/15/2018  * Right lower lobe pneumonia with small pleural effusion Patient initially placed empirically on IV antibiotics initially with cefepime and azithromycin.  Now on cefazolin  *  Hyponatremia; sodium level improved from 127-131.  *  Hyperkalemia with potassium of 6.0 Resolved.  *  Acute kidney injury; renal function improved Renal ultrasound consistent with HIV-associated  nephropathy.  Nephrology following.  No indication for hemodialysis at this time.   *  History of HIV.   Seen by infectious disease specialist.  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  Patient is interested in starting treatment.  He does not have insurance.  Sent genotype. PCP prophylaxis with atovaquone  * Anemia of chronic disease. Needed 1  unit packed RBC transfusion this admission  *  Unstageable sacral pressure injury. Present on admission.  Wound care following.  DVT prophylaxis- Lovenox  All the records are reviewed and case discussed with Care Management/Social Worker. Management plans discussed with the patient, and he is in agreement.  CODE STATUS: Full Code  TOTAL TIME TAKING CARE OF THIS PATIENT: 35 minutes.   POSSIBLE D/C IN 2-3 DAYS, DEPENDING ON CLINICAL CONDITION.   Neita Carp M.D on 08/03/2018 at 10:47 AM  Between 7am to 6pm - Pager - [phone]  After 6pm go to [email or web address] - Proofreader  [facility]  Office  [phone]  CC: Primary care physician; Patient, No Pcp Per  Note: This dictation was prepared with Dragon dictation along with smaller phrase technology. Any transcriptional errors that result from this process are unintentional.

## 2018-08-03 NOTE — Progress Notes (Signed)
PT Cancellation Note  Patient Details Name: Joshua Dougherty MRN: [id number] DOB: [birth year]   Cancelled Treatment:    Reason Eval/Treat Not Completed: Fatigue/lethargy limiting ability to participate   Approached pt x 2 this am.  Pt refused each attempt declining due to fatigue.  Will continue as appropriate.    Chesley Noon 08/03/2018, 11:05 AM

## 2018-08-04 ENCOUNTER — Inpatient Hospital Stay: Payer: Medicaid Other | Admitting: Registered Nurse

## 2018-08-04 ENCOUNTER — Inpatient Hospital Stay: Payer: Self-pay

## 2018-08-04 ENCOUNTER — Encounter
Admission: EM | Disposition: A | Discharge status: Home Health | Payer: Self-pay | Source: Home / Self Care | Attending: Internal Medicine

## 2018-08-04 ENCOUNTER — Inpatient Hospital Stay
Admit: 2018-08-04 | Discharge: 2018-08-04 | Disposition: A | Discharge status: Home / Self Care | Payer: Medicaid Other | Attending: Cardiology | Admitting: Cardiology

## 2018-08-04 ENCOUNTER — Encounter: Payer: Self-pay | Admitting: Anesthesiology

## 2018-08-04 DIAGNOSIS — R64 Cachexia: Secondary | ICD-10-CM

## 2018-08-04 HISTORY — PX: TEE WITHOUT CARDIOVERSION: SHX5443

## 2018-08-04 SURGERY — ECHOCARDIOGRAM, TRANSESOPHAGEAL
Anesthesia: General

## 2018-08-04 MED ORDER — SODIUM CHLORIDE 0.9 % IV SOLN: INTRAVENOUS | Status: DC

## 2018-08-04 MED ORDER — CEFAZOLIN IV (FOR PTA / DISCHARGE USE ONLY): 2.0000 g | Freq: Three times a day (TID) | INTRAVENOUS | 0 refills | Status: DC

## 2018-08-04 MED ORDER — BICTEGRAVIR-EMTRICITAB-TENOFOV 50-200-25 MG PO TABS: 1.0000 | ORAL_TABLET | Freq: Every day | ORAL | 0 refills | Status: DC

## 2018-08-04 MED ORDER — MEGESTROL ACETATE 400 MG/10ML PO SUSP: 400.0000 mg | Freq: Two times a day (BID) | ORAL | 0 refills | Status: DC

## 2018-08-04 MED ORDER — PROPOFOL 10 MG/ML IV BOLUS
INTRAVENOUS | Status: AC
  Filled 2018-08-04: qty 20

## 2018-08-04 MED ORDER — CEFAZOLIN IV (FOR PTA / DISCHARGE USE ONLY): 2.0000 g | Freq: Three times a day (TID) | INTRAVENOUS | 0 refills | Status: AC

## 2018-08-04 MED ORDER — SODIUM CHLORIDE FLUSH 0.9 % IV SOLN
INTRAVENOUS | Status: AC
  Filled 2018-08-04: qty 10

## 2018-08-04 MED ORDER — BUTAMBEN-TETRACAINE-BENZOCAINE 2-2-14 % EX AERO
INHALATION_SPRAY | CUTANEOUS | Status: AC
  Filled 2018-08-04: qty 5

## 2018-08-04 MED ORDER — PROPOFOL 10 MG/ML IV BOLUS
INTRAVENOUS | Status: DC | PRN
  Administered 2018-08-04: 10 mg via INTRAVENOUS
  Administered 2018-08-04: 70 mg via INTRAVENOUS
  Administered 2018-08-04: 30 mg via INTRAVENOUS
  Administered 2018-08-04: 50 mg via INTRAVENOUS

## 2018-08-04 MED ORDER — LIDOCAINE VISCOUS HCL 2 % MT SOLN
OROMUCOSAL | Status: AC
  Filled 2018-08-04: qty 15

## 2018-08-04 MED ORDER — ATOVAQUONE 750 MG/5ML PO SUSP: 1500.0000 mg | Freq: Every day | ORAL | 0 refills | Status: DC

## 2018-08-04 MED ORDER — MEGESTROL ACETATE 400 MG/10ML PO SUSP
400.0000 mg | Freq: Two times a day (BID) | ORAL | Status: DC
  Administered 2018-08-04 – 2018-08-06 (×3): 400 mg via ORAL
  Filled 2018-08-04 (×6): qty 10

## 2018-08-04 NOTE — Progress Notes (Signed)
[facility] [place] NAME: Joshua Dougherty    MR#:  [id number]  DATE OF BIRTH:  Apr 26, 1970  SUBJECTIVE:  CHIEF COMPLAINT:   Chief Complaint  Patient presents with  . Chronic Renal Failure   Had TEE earlier  REVIEW OF SYSTEMS:  Review of Systems  Constitutional: Positive for weight loss. Negative for chills and fever.  HENT: Negative for hearing loss and tinnitus.   Eyes: Negative for blurred vision and double vision.  Respiratory: Negative for cough and shortness of breath.   Cardiovascular: Negative for chest pain and palpitations.  Gastrointestinal: Negative for heartburn, nausea and vomiting.  Genitourinary: Negative for dysuria.  Musculoskeletal: Negative for myalgias and neck pain.  Skin: Negative for itching and rash.  Neurological: Negative for dizziness and headaches.  Psychiatric/Behavioral: Negative for depression and hallucinations.    DRUG ALLERGIES:  No Known Allergies VITALS:  Blood pressure 121/73, pulse 75, temperature 98.4 F (36.9 C), temperature source Oral, resp. rate 17, height 6\' 4"  (1.93 m), weight 68 kg, SpO2 100 %. PHYSICAL EXAMINATION:   Physical Exam  Constitutional: He is oriented to person, place, and time. He appears well-developed.  Thin  HENT:  Head: Normocephalic and atraumatic.  Right Ear: External ear normal.  Eyes: Pupils are equal, round, and reactive to light. Conjunctivae are normal. Right eye exhibits no discharge.  Neck: Normal range of motion.  Cardiovascular: Normal rate, regular rhythm and normal heart sounds.  Respiratory: Effort normal. He has rales.  GI: Soft. Bowel sounds are normal. He exhibits no distension.  Musculoskeletal: Normal range of motion.        General: No edema.  Neurological: He is alert and oriented to person, place, and time. No cranial nerve deficit.  Skin: Skin is warm. He is not diaphoretic. No erythema.  Psychiatric: He has a normal mood and affect. His  behavior is normal.     LABORATORY PANEL:  Male CBC Recent Labs  Lab 08/02/18 0437  WBC 7.2  HGB 7.7*  HCT 22.6*  PLT 209   ------------------------------------------------------------------------------------------------------------------ Chemistries  Recent Labs  Lab 08/01/18 0042 08/02/18 0437  NA 128* 132*  K 3.8 3.8  CL 100 102  CO2 23 22  GLUCOSE 144* 116*  BUN 19 16  CREATININE 0.80 0.79  CALCIUM 7.4* 7.7*  MG 1.6* 1.8  AST 35  --   ALT 14  --   ALKPHOS 168*  --   BILITOT 0.7  --    RADIOLOGY:  [place] Ekg Site Rite  Result Date: 08/04/2018 If Site Rite image not attached, placement could not be confirmed due to current cardiac rhythm.  ASSESSMENT AND PLAN:   * MSSA bacteremia    On IV cefazolin - needs this till 08/25/2018 Hemodialysis catheter appears to have been left in place when patient was recently discharged to hospice.  This has been removed.  Culture of catheter tip also growing staph aureus.  Patient seen by infectious disease.  Recommended TEE which was attempted Friday but patient could not be adequately sedated.  TEE canceled and rescheduled to be done on Monday with anesthesia. CT of the spine showed no signs of infection .  Blood cultures negative.  TEE showed no endocarditis We will place PICC line.  Discharge home tomorrow once IV antibiotics are set up  * Right lower lobe pneumonia with small pleural effusion Patient initially placed empirically on IV antibiotics initially with cefepime and azithromycin.  Now on cefazolin  *  Hyponatremia; sodium level improved   *  Hyperkalemia with potassium of 6.0 Resolved.  *  Acute kidney injury; renal function improved Renal ultrasound consistent with HIV-associated nephropathy.  Nephrology following.  No indication for hemodialysis at this time.   *  History of HIV.   Seen by infectious disease specialist.  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   Patient is interested in starting treatment.  He does not have insurance.  Sent genotype. PCP prophylaxis with atovaquone  * Anemia of chronic disease. Needed 1 unit packed RBC transfusion this admission  *  Unstageable sacral pressure injury. Present on admission.  Wound care following.  DVT prophylaxis- Lovenox  All the records are reviewed and case discussed with Care Management/Social Worker. Management plans discussed with the patient, and he is in agreement.  CODE STATUS: Full Code  TOTAL TIME TAKING CARE OF THIS PATIENT: 35 minutes.   POSSIBLE D/C IN 2-3 DAYS, DEPENDING ON CLINICAL CONDITION.   Neita Carp M.D on 08/04/2018 at 2:14 PM  Between 7am to 6pm - Pager - [phone]  After 6pm go to [email or web address] - Proofreader  [facility]  Office  [phone]  CC: Primary care physician; Patient, No Pcp Per  Note: This dictation was prepared with Dragon dictation along with smaller phrase technology. Any transcriptional errors that result from this process are unintentional.

## 2018-08-04 NOTE — Progress Notes (Signed)
Primary RN aware PICC cannot be placed today.

## 2018-08-04 NOTE — Anesthesia Post-op Follow-up Note (Signed)
Anesthesia QCDR form completed.        

## 2018-08-04 NOTE — Anesthesia Procedure Notes (Signed)
Date/Time: 08/04/2018 7:35 AM Performed by: Doreen Salvage, CRNA Pre-anesthesia Checklist: Patient identified, Emergency Drugs available, Suction available and Patient being monitored Patient Re-evaluated:Patient Re-evaluated prior to induction Oxygen Delivery Method: Nasal cannula Induction Type: IV induction Dental Injury: Teeth and Oropharynx as per pre-operative assessment  Comments: Nasal cannula with etCO2 monitoring

## 2018-08-04 NOTE — TOC Progression Note (Signed)
Transition of Care [facility]) - Progression Note    Patient Details  Name: Joshua Dougherty MRN: [id number] Date of Birth: [birth year]  Transition of Care [facility]) CM/SW Contact  Shelbie Hutching, RN Phone Number: 08/04/2018, 4:00 PM  Clinical Narrative:    Patient gives permission for RNCM to speak with Joshua Dougherty, pt's ex wife.  Patient and his ex wife remain close and on good terms.  Patient will need someone that can act as a caregiver when he goes home, he will need help with dressing changes and IV antibiotics.  Joshua Dougherty is going to talk with the patient's sister and see if they can arrange a caregiver.  Explained to [place] that if patient does not have a care giver at home he will not be able to get home health services.  Joshua Dougherty will call back tomorrow with an answer.    Expected Discharge Plan: [place] Barriers to Discharge: Continued Medical Work up  Expected Discharge Plan and Services Expected Discharge Plan: [place]   Discharge Planning Services: CM Consult   Living arrangements for the past 2 months: Single Family Home, Hospice Facility(hospice facility for the past month before that home) Expected Discharge Date: 08/01/18                                     Social Determinants of Health (SDOH) Interventions    Readmission Risk Interventions No flowsheet data found.

## 2018-08-04 NOTE — Progress Notes (Addendum)
PHARMACY CONSULT NOTE FOR:  OUTPATIENT  PARENTERAL ANTIBIOTIC THERAPY (OPAT)  Indication: MSSA bacteremia Regimen: cefazolin 2gm q8h End date: 08/15/2018  THIS WAS CHANGED FROM ORIGINAL PLAN, ID HAS UPDATED PLAN TO COMPLETE 2 WEEKS OF ANTIBIOTICS FROM 1ST NEGATIVE BLOOD CULTURE (WHICH WAS 08/01/2018)  IV antibiotic discharge orders are pended. To discharging provider:  please sign these orders via discharge navigator,  Select New Orders & click on the button choice - Manage This Unsigned Work.     Thank you for allowing pharmacy to be a part of this patient's care.  Doreene Eland, PharmD, BCPS.   Work Cell: [phone] 08/04/2018 3:02 PM

## 2018-08-04 NOTE — TOC Progression Note (Signed)
Transition of Care [facility]) - Progression Note    Patient Details  Name: Joshua Dougherty MRN: [id number] Date of Birth: 1971-01-09  Transition of Care [facility]) CM/SW Contact  Shelbie Hutching, RN Phone Number: 08/04/2018, 2:15 PM  Clinical Narrative:   Plan for patient to discharge home with home health services tomorrow.  Patient will need IV antibiotics until Aug 3rd should have PICC line placed today.  [facility] contacted for IV antibiotics at home and [place] contacted for home health services, they will provide charity care home health services.  Patient has applied for Medicaid.   RNCM attempting to find and schedule PCP appointment.       Expected Discharge Plan: [place] Barriers to Discharge: Continued Medical Work up  Expected Discharge Plan and Services Expected Discharge Plan: [place]   Discharge Planning Services: CM Consult   Living arrangements for the past 2 months: Single Family Home, Hospice Facility(hospice facility for the past month before that home) Expected Discharge Date: 08/01/18                                     Social Determinants of Health (SDOH) Interventions    Readmission Risk Interventions No flowsheet data found.

## 2018-08-04 NOTE — Progress Notes (Signed)
Patient post TEE per Dr Ubaldo Glassing with general anesthesia. Tolerated well. Vitals stable. Sinus rhythm per monitor, report called to care nurse with questions answered.

## 2018-08-04 NOTE — Anesthesia Preprocedure Evaluation (Signed)
Anesthesia Evaluation  Patient identified by MRN, date of birth, ID band Patient awake    Reviewed: Allergy & Precautions, H&P , NPO status , Patient's Chart, lab work & pertinent test results, reviewed documented beta blocker date and time   History of Anesthesia Complications Negative for: history of anesthetic complications  Airway Mallampati: II  TM Distance: >3 FB Neck ROM: full    Dental  (+) Dental Advidsory Given, Poor Dentition   Pulmonary neg shortness of breath, pneumonia, neg recent URI, former smoker,  Tuberculosis          Cardiovascular Exercise Tolerance: Good negative cardio ROS       Neuro/Psych negative neurological ROS  negative psych ROS   GI/Hepatic negative GI ROS, Neg liver ROS,   Endo/Other  negative endocrine ROS  Renal/GU Renal disease  negative genitourinary   Musculoskeletal   Abdominal   Peds  Hematology  (+) HIV,   Anesthesia Other Findings Past Medical History: No date: HIV (human immunodeficiency virus infection) (HCC) No date: Renal disorder No date: Tuberculosis   Reproductive/Obstetrics negative OB ROS                             Anesthesia Physical Anesthesia Plan  ASA: III  Anesthesia Plan: General   Post-op Pain Management:    Induction: Intravenous  PONV Risk Score and Plan: 2 and Propofol infusion and TIVA  Airway Management Planned: Nasal Cannula and Natural Airway  Additional Equipment:   Intra-op Plan:   Post-operative Plan:   Informed Consent: I have reviewed the patients History and Physical, chart, labs and discussed the procedure including the risks, benefits and alternatives for the proposed anesthesia with the patient or authorized representative who has indicated his/her understanding and acceptance.     Dental Advisory Given  Plan Discussed with: Anesthesiologist, CRNA and Surgeon  Anesthesia Plan Comments:          Anesthesia Quick Evaluation

## 2018-08-04 NOTE — CV Procedure (Signed)
   TRANSESOPHAGEAL ECHOCARDIOGRAM   NAME:  Joshua Dougherty   MRN: [id number] DOB:  06-24-1970   ADMIT DATE: 07/29/2018  INDICATIONS:   PROCEDURE:   Informed consent was obtained prior to the procedure. The risks, benefits and alternatives for the procedure were discussed and the patient comprehended these risks.  Risks include, but are not limited to, cough, sore throat, vomiting, nausea, somnolence, esophageal and stomach trauma or perforation, bleeding, low blood pressure, aspiration, pneumonia, infection, trauma to the teeth and death.    After a procedural time-out, the patient was sedated per dept of anesthesia.   The transesophageal probe was inserted in the esophagus and stomach without difficulty and multiple views were obtained. I was present for the entire procedure.    COMPLICATIONS:    There were no immediate complications.  FINDINGS:  LEFT VENTRICLE: EF = 55%. No regional wall motion abnormalities.  RIGHT VENTRICLE: Normal size and function.   LEFT ATRIUM: Normal size and function  LEFT ATRIAL APPENDAGE: No thrombus. Good doppler flow  RIGHT ATRIUM: Normal  AORTIC VALVE:  Trileaflet. No vegetations. No AI MITRAL VALVE:    Normal. No vegetations. Trivial mr  TRICUSPID VALVE: Normal. No vegetations. Trivial to mild tr  PULMONIC VALVE: Grossly normal.  INTERATRIAL SEPTUM: No PFO or ASD.  PERICARDIUM: No effusion  DESCENDING AORTA:   CONCLUSION: No evidence of vegetations. Aortic, mitral and tricuspid valves appeared normal.

## 2018-08-04 NOTE — Anesthesia Postprocedure Evaluation (Signed)
Anesthesia Post Note  Patient: Joshua Dougherty  Procedure(s) Performed: TRANSESOPHAGEAL ECHOCARDIOGRAM (TEE) (N/A )  Patient location during evaluation: Specials Recovery Anesthesia Type: General Level of consciousness: awake and alert Pain management: pain level controlled Vital Signs Assessment: post-procedure vital signs reviewed and stable Respiratory status: spontaneous breathing, nonlabored ventilation, respiratory function stable and patient connected to nasal cannula oxygen Cardiovascular status: blood pressure returned to baseline and stable Postop Assessment: no apparent nausea or vomiting Anesthetic complications: no     Last Vitals:  Vitals:   08/04/18 0845 08/04/18 1208  BP: 106/76 121/73  Pulse: 70 75  Resp: 18 17  Temp: 36.9 C 36.9 C  SpO2: 100% 100%    Last Pain:  Vitals:   08/04/18 1208  TempSrc: Oral  PainSc:                  Martha Clan

## 2018-08-04 NOTE — Treatment Plan (Addendum)
Diagnosis: MSSA bacteremia Baseline Creatinine <1 Culture Result: staph aureus No Known Allergies  OPAT Orders Discharge antibiotics: Cefazolin 2 grams IV  Every 8 hours End Date:08/15/18   [facility] Care Per Protocol:  Labs weekly while on IV antibiotics: _X_ CBC with differential X_CMP  X__ Please pull PIC at completion of IV antibiotics  Fax weekly labs to [phone]  Clinic Follow Up Appt: 2 weeks  Call [phone] to make appt

## 2018-08-04 NOTE — Progress Notes (Signed)
Pharmacy - Antimicrobial Stewardship  84 YOM w/ HIV interested in starting ART.  Case discussed with Dr. Delaine Lame.  Patient applying for patient assistance with HMAP as outpatient.  Applied for [employer] via mfg, [employer].  He was approved and prescription sent to [facility].  Medication to be mailed to patient.  I reviewed with patient that he will likely received medication in mail 08/06/2018, he confirmed that [place] was good address to send medication.  Also reviewed common side effects and to avoid taking Biktarvy with multivitamin.    Doreene Eland, PharmD, BCPS.   Work Cell: [phone] 08/04/2018 3:55 PM

## 2018-08-04 NOTE — Progress Notes (Signed)
*  PRELIMINARY RESULTS* Echocardiogram Echocardiogram Transesophageal has been performed.  Sherrie Sport 08/04/2018, 8:09 AM

## 2018-08-04 NOTE — Transfer of Care (Signed)
Immediate Anesthesia Transfer of Care Note  Patient: Joshua Dougherty  Procedure(s) Performed: Procedure(s): TRANSESOPHAGEAL ECHOCARDIOGRAM (TEE) (N/A)  Patient Location: PACU and Short Stay  Anesthesia Type:General  Level of Consciousness: awake, alert  and oriented  Airway & Oxygen Therapy: Patient Spontanous Breathing and Patient connected to nasal cannula oxygen  Post-op Assessment: Report given to RN and Post -op Vital signs reviewed and stable  Post vital signs: Reviewed and stable  Last Vitals:  Vitals:   08/04/18 0710 08/04/18 0758  BP: 122/76 102/71  Pulse: 88 77  Resp: 20 (!) 24  Temp: 36.9 C   SpO2: 59% 276%    Complications: No apparent anesthesia complications

## 2018-08-04 NOTE — Progress Notes (Signed)
ID Pt feeling okay No specific issues Patient Vitals for the past 24 hrs:  BP Temp Temp src Pulse Resp SpO2 Height Weight  08/04/18 1208 121/73 98.4 F (36.9 C) Oral 75 17 100 % - -  08/04/18 0845 106/76 98.4 F (36.9 C) Oral 70 18 100 % - -  08/04/18 0830 111/78 - - 73 20 100 % - -  08/04/18 0815 105/73 - - 71 18 100 % - -  08/04/18 0800 100/72 - - 73 20 100 % - -  08/04/18 0758 102/71 - - 77 (!) 24 100 % - -  08/04/18 0730 120/82 - - 70 20 100 % - -  08/04/18 0715 122/76 - - 69 (!) 22 99 % - -  08/04/18 0710 122/76 98.4 F (36.9 C) Oral 88 20 98 % 6\' 4"  (1.93 m) 68 kg  08/04/18 0437 104/73 98.7 F (37.1 C) Oral 73 18 99 % - -   TEE done today neg  Impression MSSA bacteremia related to the dialysis catheter which has been removed- repeat culture from 7/10 neg- TEE neg and MRI of the spine negative Will give cefazolin for total of 2 weeks Awaiting PICC   AIDS- ADAP being processed  by [place] at [facility] Also 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

## 2018-08-04 NOTE — Progress Notes (Signed)
Nutrition Follow Up Note   DOCUMENTATION CODES:   Severe malnutrition in context of chronic illness, Underweight  INTERVENTION:   Continue Ensure Enlive po TID, each supplement provides 350 kcal and 20 grams of protein. Patient prefers vanilla or strawberry.  Continue Ocuvite daily for wound healing (provides zinc, vitamin A, vitamin C, Vitamin E, copper, and selenium).  Recommend Megace 300mg  po BID  NUTRITION DIAGNOSIS:   Severe Malnutrition related to chronic illness(Tb, HIV) as evidenced by severe fat depletion, severe muscle depletion.  GOAL:   Patient will meet greater than or equal to 90% of their needs  -progressing   MONITOR:   PO intake, Supplement acceptance, Labs, Weight trends, Skin, I & O's  ASSESSMENT:   48 year old male with PMHx of Tb, HIV currently not on treatment who is admitted from hospice home with PNA, hyponatremia, AKI.   Spoke with pt today via phone. Pt reports that his appetite is improving but still is not back to normal. Pt reports eating 99% of some eggs and toast this morning. Pt documented to be eating anywhere from 20-100% of meals. Pt does report drinking 3 Ensure per day in hospital. Recommend change from Marinol to Megace as this tends to work better for appetite stimulant. Per chart, pt is weight stable since admit. Refeed labs stable.     Medications reviewed and include: marinol, lovenox, ocuvite, cefazolin  Labs reviewed: Na 132(L), Mg 1.8 wnl P 4.1 wnl- 7/8 Hgb 7.7(L), Hct 22.6(L)  Diet Order:   Diet Order            Diet regular Room service appropriate? Yes; Fluid consistency: Thin  Diet effective now             EDUCATION NEEDS:   No education needs have been identified at this time  Skin:  Skin Assessment: Skin Integrity Issues:(stage III sacrum)  Last BM:  7/9  Height:   Ht Readings from Last 1 Encounters:  08/04/18 6\' 4"  (1.93 m)   Weight:   Wt Readings from Last 1 Encounters:  08/04/18 68 kg   Ideal  Body Weight:  91.8 kg  BMI:  Body mass index is 18.25 kg/m.  Estimated Nutritional Needs:   Kcal:  2000-2200  Protein:  100-110 grams  Fluid:  2 L/day  Koleen Distance MS, RD, LDN Pager #- [phone] Office#- [phone] After Hours Pager: [phone]

## 2018-08-04 NOTE — Progress Notes (Signed)
New referral for outpatient Palliative to follow at home received from [facility]. Patient information faxed to palliative referral.  CMRN is working on aquiring a PCP for patient. Flo Shanks BSN, RN, [facility] [employer] [phone]

## 2018-08-04 NOTE — Progress Notes (Signed)
Daily Progress Note   Patient Name: Joshua Dougherty       Date: 08/04/2018 DOB: 10-20-1970  Age: 48 y.o. MRN#: [id number] Attending Physician: Hillary Bow, MD Primary Care Physician: Patient, No Pcp Per Admit Date: 07/29/2018  Reason for Consultation/Follow-up: Psychosocial/spiritual support  Subjective: Patient is resting in bed. He just returned from a TEE. He states he is still happy for any care the medical team recommends to help him improve, and prolong his life. He states the Marinol is not helping improve his appetite. He would like to speak with a dietitian about healthy food recommendations. We discussed PT/OT. He states he wants to work with them to improve strength.    Marinol order in place. Would recommend changing this to Remeron starting at low dose such as 7.5mg . Dietitian order placed.   Length of Stay: 6  Current Medications: Scheduled Meds:  . atovaquone  1,500 mg Oral Q breakfast  . butamben-tetracaine-benzocaine      . collagenase   Topical Daily  . dronabinol  2.5 mg Oral BID AC  . enoxaparin (LOVENOX) injection  40 mg Subcutaneous Q24H  . feeding supplement (ENSURE ENLIVE)  237 mL Oral TID BM  . lidocaine      . multivitamin-lutein  1 capsule Oral Daily  . sodium chloride flush        Continuous Infusions: . sodium chloride    .  ceFAZolin (ANCEF) IV Stopped (08/04/18 0647)    PRN Meds: sodium chloride, acetaminophen **OR** acetaminophen, albuterol, bisacodyl, guaiFENesin, HYDROcodone-acetaminophen, ondansetron **OR** ondansetron (ZOFRAN) IV, senna-docusate  Physical Exam Pulmonary:     Effort: Pulmonary effort is normal.  Skin:    General: Skin is warm and dry.  Neurological:     Mental Status: He is alert.             Vital Signs: BP 106/76 (BP  Location: Left Arm)   Pulse 70   Temp 98.4 F (36.9 C) (Oral)   Resp 18   Ht 6\' 4"  (1.93 m)   Wt 68 kg   SpO2 100%   BMI 18.25 kg/m  SpO2: SpO2: 100 % O2 Device: O2 Device: Room Air O2 Flow Rate: O2 Flow Rate (L/min): 2 L/min  Intake/output summary:   Intake/Output Summary (Last 24 hours) at 08/04/2018 1016 Last data filed at 08/04/2018 [phone]  Gross per 24 hour  Intake 593.51 ml  Output 1175 ml  Net -581.49 ml   LBM: Last BM Date: 07/31/18 Baseline Weight: Weight: 68 kg Most recent weight: Weight: 68 kg       Palliative Assessment/Data:      Patient Active Problem List   Diagnosis Date Noted  . Bacteremia due to Staphylococcus aureus 08/02/2018  . Pressure injury of skin 07/30/2018  . Pneumonia 07/29/2018  . HIV-associated nephropathy ([place]) 07/08/2018  . CAP (community acquired pneumonia) 07/01/2018  . Protein-calorie malnutrition, severe 03/19/2018  . Hemoptysis 03/18/2018  . Tobacco abuse 05/20/2014  . Asymptomatic HIV infection ([place]) 05/20/2014    Palliative Care Assessment & Plan    Recommendations/Plan:  -- Continue full code/full scope.  --Recommend changing Marinol to Remeron starting at a low dose such as 7.5mg  for cost and side effects.  -- Dietitian referral made.  -- Recommend palliative at D/C.   Code Status:    Code Status Orders  (From admission, onward)         Start     Ordered   07/29/18 2137  Full code  Continuous     07/29/18 2136        Code Status History    Date Active Date Inactive Code Status Order ID Comments User Context   07/01/2018 7619 07/10/2018 1504 DNR [id number]  Tyler Pita, MD Inpatient   07/01/2018 0425 07/01/2018 1634 Full Code [id number]  Mansy, Arvella Merles, MD ED   03/18/2018 0510 03/21/2018 1525 Full Code [id number]  Harrie Foreman, MD Inpatient   Advance Care Planning Activity       Prognosis:   Unable to determine  Discharge Planning:  To Be Determined   Thank you for allowing the Palliative  Medicine Team to assist in the care of this patient.   Total Time 25 min Prolonged Time Billed  no      Greater than 50%  of this time was spent counseling and coordinating care related to the above assessment and plan.  Asencion Gowda, NP  Please contact Palliative Medicine Team phone at [phone] for questions and concerns.

## 2018-08-04 NOTE — Progress Notes (Signed)
PT Cancellation Note  Patient Details Name: Joshua Dougherty MRN: [id number] DOB: 1970-07-27   Cancelled Treatment:    Reason Eval/Treat Not Completed: Patient declined, no reason specified;Other (comment). Rx attempted x 2; initial attempt, pt eating late breakfast post procedure and agreed to return later. Second attempt, pt refuses stating lunch should be arriving soon and wishes to wait to walk. Re attempt at a later time/date, as the schedule allows.    Larae Grooms, PTA 08/04/2018, 12:34 PM

## 2018-08-05 ENCOUNTER — Encounter: Payer: Self-pay | Admitting: Cardiology

## 2018-08-05 MED ORDER — SODIUM CHLORIDE 0.9% FLUSH: 10.0000 mL | INTRAVENOUS | Status: DC | PRN

## 2018-08-05 MED ORDER — SODIUM CHLORIDE 0.9% FLUSH
10.0000 mL | Freq: Two times a day (BID) | INTRAVENOUS | Status: DC
  Administered 2018-08-05: 12:00:00 20 mL
  Administered 2018-08-05 – 2018-08-06 (×2): 10 mL

## 2018-08-05 MED FILL — BIKTARVY 50-200-25 MG TABS: 50-200-25 | 30 days supply | Qty: 30 | Fill #0

## 2018-08-05 NOTE — TOC Progression Note (Signed)
Transition of Care [facility]) - Progression Note    Patient Details  Name: Joshua Dougherty MRN: [id number] Date of Birth: 04-Nov-1970  Transition [facility]) CM/SW Contact  Shelbie Hutching, RN Phone Number: 08/05/2018, 2:57 PM  Clinical Narrative:     Patient met with potential caregiver at the bedside today that the patient and family will hire privately.  Caregiver's name is Joshua Dougherty and she has agreed to help patient with dressing changes and bathing on the days that home health does not come out.  Patient's ex wife Joshua Dougherty is on board with plan and help arrange caregiving services.  Patient will be ready for discharge tomorrow.  [facility] will provide home health services.  [employer] will provide walker, bedside commode, and hospital bed.  Joshua Dougherty will be able to pick patient up at discharge tomorrow afternoon.   Expected Discharge Plan: [place] Barriers to Discharge: Continued Medical Work up  Expected Discharge Plan and Services Expected Discharge Plan: [place]   Discharge Planning Services: CM Consult   Living arrangements for the past 2 months: Single Family Home, Hospice Facility(hospice facility for the past month before that home) Expected Discharge Date: 08/01/18                                     Social Determinants of Health (SDOH) Interventions    Readmission Risk Interventions No flowsheet data found.

## 2018-08-05 NOTE — Progress Notes (Signed)
Patient has unstageable pressure ulcer of the skin and severe protein calorie malnutrition which requires the patient's body and legs to be positioned in ways not feasible with a normal bed.  Head must be elevated 30 degrees and alternating pressure pad and pump is needed for offloading of pressure ulcer.

## 2018-08-05 NOTE — Progress Notes (Signed)
[place] [facility] [place] NAME: Kiven Vangilder    MR#:  [id number]  DATE OF BIRTH:  December 01, 1970  SUBJECTIVE:  CHIEF COMPLAINT:   Chief Complaint  Patient presents with  . Chronic Renal Failure   Afebrile.  Eating well.   PICC line to be placed today  REVIEW OF SYSTEMS:  Review of Systems  Constitutional: Positive for weight loss. Negative for chills and fever.  HENT: Negative for hearing loss and tinnitus.   Eyes: Negative for blurred vision and double vision.  Respiratory: Negative for cough and shortness of breath.   Cardiovascular: Negative for chest pain and palpitations.  Gastrointestinal: Negative for heartburn, nausea and vomiting.  Genitourinary: Negative for dysuria.  Musculoskeletal: Negative for myalgias and neck pain.  Skin: Negative for itching and rash.  Neurological: Negative for dizziness and headaches.  Psychiatric/Behavioral: Negative for depression and hallucinations.    DRUG ALLERGIES:  No Known Allergies VITALS:  Blood pressure 100/66, pulse 83, temperature 99.3 F (37.4 C), temperature source Oral, resp. rate 15, height 6\' 4"  (1.93 m), weight 68 kg, SpO2 100 %. PHYSICAL EXAMINATION:   Physical Exam  Constitutional: He is oriented to person, place, and time. He appears well-developed.  Thin  HENT:  Head: Normocephalic and atraumatic.  Right Ear: External ear normal.  Eyes: Pupils are equal, round, and reactive to light. Conjunctivae are normal. Right eye exhibits no discharge.  Neck: Normal range of motion.  Cardiovascular: Normal rate, regular rhythm and normal heart sounds.  Respiratory: Effort normal. He has rales.  GI: Soft. Bowel sounds are normal. He exhibits no distension.  Musculoskeletal: Normal range of motion.        General: No edema.  Neurological: He is alert and oriented to person, place, and time. No cranial nerve deficit.  Skin: Skin is warm. He is not diaphoretic. No erythema.  Psychiatric:  He has a normal mood and affect. His behavior is normal.     LABORATORY PANEL:  Male CBC Recent Labs  Lab 08/02/18 0437  WBC 7.2  HGB 7.7*  HCT 22.6*  PLT 209   ------------------------------------------------------------------------------------------------------------------ Chemistries  Recent Labs  Lab 08/01/18 0042 08/02/18 0437  NA 128* 132*  K 3.8 3.8  CL 100 102  CO2 23 22  GLUCOSE 144* 116*  BUN 19 16  CREATININE 0.80 0.79  CALCIUM 7.4* 7.7*  MG 1.6* 1.8  AST 35  --   ALT 14  --   ALKPHOS 168*  --   BILITOT 0.7  --    RADIOLOGY:  [place] Ekg Site Rite  Result Date: 08/04/2018 If Site Rite image not attached, placement could not be confirmed due to current cardiac rhythm.  ASSESSMENT AND PLAN:   * MSSA bacteremia    On IV cefazolin - needs this till 08/25/2018 Hemodialysis catheter appears to have been left in place when patient was recently discharged to hospice.  This has been removed.  Culture of catheter tip also growing staph aureus.  Patient seen by infectious disease.  Recommended TEE which was attempted Friday but patient could not be adequately sedated.  TEE canceled and rescheduled to be done on Monday with anesthesia. CT of the spine showed no signs of infection .  Blood cultures negative.  TEE showed no endocarditis PICC line ordered.  Waiting for antibiotics and home health to be set up prior to discharge.  * Right lower lobe pneumonia with small pleural effusion Patient initially placed empirically on IV antibiotics  initially with cefepime and azithromycin.  Now on cefazolin  *  Hyponatremia; sodium level improved   *  Hyperkalemia with potassium of 6.0 Resolved.  *  Acute kidney injury; renal function improved Renal ultrasound consistent with HIV-associated nephropathy.  Nephrology following.  No indication for hemodialysis at this time.   *  History of HIV.   Seen by infectious disease specialist.  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  Patient is interested in starting treatment.  He does not have insurance.  Sent genotype. PCP prophylaxis with atovaquone  * Anemia of chronic disease. Needed 1 unit packed RBC transfusion this admission  *  Unstageable sacral pressure injury. Present on admission.  Wound care following.  DVT prophylaxis- Lovenox  All the records are reviewed and case discussed with Care Management/Social Worker. Management plans discussed with the patient, and he is in agreement.  CODE STATUS: Full Code  TOTAL TIME TAKING CARE OF THIS PATIENT: 30 minutes.   POSSIBLE D/C IN 1-2 DAYS, DEPENDING ON CLINICAL CONDITION.   Neita Carp M.D on 08/05/2018 at 10:56 AM  Between 7am to 6pm - Pager - [phone]  After 6pm go to [email or web address] - Proofreader  [employer] [facility] [place]  Office  [phone]  CC: Primary care physician; Patient, No Pcp Per  Note: This dictation was prepared with Dragon dictation along with smaller phrase technology. Any transcriptional errors that result from this process are unintentional.

## 2018-08-05 NOTE — TOC Progression Note (Signed)
Transition of Care [facility]) - Progression Note    Patient Details  Name: Joshua Dougherty MRN: [id number] Date of Birth: 03-15-1970  Transition of Care [facility]) CM/SW Contact  Shelbie Hutching, RN Phone Number: 08/05/2018, 10:21 AM  Clinical Narrative:    RNCM spoke with Leodis Rains again this morning about patient discharge plan.  Reuben Likes reports that she and the patient' sister are working on finding a caregiver that can come in and help with dressing changes and bathing.  Reuben Likes reports that she needs another day or so to arrange home care, she and the patient's sister are considering hiring a care giver.  RNCM discussed this with the MD and he is okay with the patient discharging tomorrow or Thursday.  Patient is getting PICC line placed now, Carolynn Sayers will see patient today and educate on home IV antibiotic administration.    Expected Discharge Plan: [place] Barriers to Discharge: Continued Medical Work up  Expected Discharge Plan and Services Expected Discharge Plan: [place]   Discharge Planning Services: CM Consult   Living arrangements for the past 2 months: Single Family Home, Hospice Facility(hospice facility for the past month before that home) Expected Discharge Date: 08/01/18                                     Social Determinants of Health (SDOH) Interventions    Readmission Risk Interventions No flowsheet data found.

## 2018-08-05 NOTE — Progress Notes (Signed)
Peripherally Inserted Central Catheter/Midline Placement  The IV Nurse has discussed with the patient and/or persons authorized to consent for the patient, the purpose of this procedure and the potential benefits and risks involved with this procedure.  The benefits include less needle sticks, lab draws from the catheter, and the patient may be discharged home with the catheter. Risks include, but not limited to, infection, bleeding, blood clot (thrombus formation), and puncture of an artery; nerve damage and irregular heartbeat and possibility to perform a PICC exchange if needed/ordered by physician.  Alternatives to this procedure were also discussed.  Bard Power PICC patient education guide, fact sheet on infection prevention and patient information card has been provided to patient /or left at bedside.    PICC/Midline Placement Documentation  PICC Single Lumen 37/34/28 PICC Right Basilic 42 cm 0 cm (Active)  Indication for Insertion or Continuance of Line Home intravenous therapies (PICC only) 08/05/18 1105  Exposed Catheter (cm) 0 cm 08/05/18 1105  Site Assessment Clean;Dry;Intact 08/05/18 1105  Line Status Flushed;Blood return noted 08/05/18 1105  Dressing Type Transparent 08/05/18 1105  Dressing Status Clean;Dry;Intact;Antimicrobial disc in place 08/05/18 1105  Dressing Intervention New dressing 08/05/18 1105  Dressing Change Due 08/12/18 08/05/18 1105       Joshua Dougherty 08/05/2018, 11:05 AM

## 2018-08-06 LAB — MISC LABCORP TEST (SEND OUT): Labcorp test code: 6926

## 2018-08-06 LAB — CULTURE, BLOOD (ROUTINE X 2)
Culture: NO GROWTH
Culture: NO GROWTH

## 2018-08-06 NOTE — Plan of Care (Signed)

## 2018-08-06 NOTE — TOC Transition Note (Signed)
Transition of Care [facility]) - CM/SW Discharge Note   Patient Details  Name: Kalden Wanke MRN: [id number] Date of Birth: [birth year]  Transition of Care [facility]) CM/SW Contact:  Shelbie Hutching, RN Phone Number: 08/06/2018, 3:31 PM   Clinical Narrative:    Patient ready for discharge all services arranged.  Patient's ex wife to pick up at discharge.  Walker delivered to the room.  Hospital bed and 3 in 1 will be delivered to the house.    Final next level of care: [place] Barriers to Discharge: Barriers Resolved   Patient Goals and CMS Choice Patient states their goals for this hospitalization and ongoing recovery are:: Patient reports he wants treatment, no longer wants to be hospice but he reports he doesn't think he need dialysis      Discharge Placement                       Discharge Plan and Services   Discharge Planning Services: CM Consult            DME Arranged: 3-N-1, Hospital bed, Walker DME Agency: [employer] Date DME Agency Contacted: 08/06/18 Time DME Agency Contacted: [id number] Representative spoke with at DME Agency: [place] Arranged: RN, PT [facility] Agency: [place] ([place])        Social Determinants of Health (SDOH) Interventions     Readmission Risk Interventions No flowsheet data found.

## 2018-08-06 NOTE — Progress Notes (Signed)
Discharge instructions given with stated understanding.  Patient waiting for transportation home at this time 

## 2018-08-06 NOTE — Discharge Instructions (Signed)
Regular diet. °Activity as tolerated. °

## 2018-08-07 ENCOUNTER — Other Ambulatory Visit: Payer: Self-pay | Admitting: Licensed Clinical Social Worker

## 2018-08-07 DIAGNOSIS — J984 Other disorders of lung: Secondary | ICD-10-CM

## 2018-08-07 LAB — HUMAN PARVOVIRUS DNA DETECTION BY PCR: Parvovirus B19, PCR: NEGATIVE

## 2018-08-07 MED ORDER — SULFAMETHOXAZOLE-TRIMETHOPRIM 400-80 MG PO TABS: 1.0000 | ORAL_TABLET | ORAL | 2 refills | Status: DC

## 2018-08-07 NOTE — Discharge Summary (Signed)
[place] [facility] [place] NAME: Joshua Dougherty    MR#:  [id number]  DATE OF BIRTH:  March 03, 1970  DATE OF ADMISSION:  07/29/2018 ADMITTING PHYSICIAN: Demetrios Loll, MD  DATE OF DISCHARGE: 08/06/2018  5:13 PM  PRIMARY CARE PHYSICIAN: Patient, No Pcp Per   ADMISSION DIAGNOSIS:  Cachexia ([place]) [R64] Hyperkalemia [E87.5] Hyponatremia [E87.1] Weight loss [R63.4] Failure to thrive in adult [R62.7] Hypotension, unspecified hypotension type [I95.9]  DISCHARGE DIAGNOSIS:  Principal Problem:   Bacteremia due to Staphylococcus aureus Active Problems:   Pneumonia   Pressure injury of skin   SECONDARY DIAGNOSIS:   Past Medical History:  Diagnosis Date  . HIV (human immunodeficiency virus infection) ([place])   . Renal disorder   . Tuberculosis      ADMITTING HISTORY  Joshua Dougherty  is a 48 y.o. male with a known history of HIV, TB, CKD, he was on hemodialysis.  The patient was seen hospice home.  He said he did not receive any medication or treatment.  He left hospice home and to come to ED for treatment.  The patient denies any fever or chills, no cough or sputum or chest pain.  He was found hyponatremia and hyperkalemia.  Chest x-ray reported right-sided pneumonia.  He is treated with antibiotics in the ED.  Dr. Rip Harbour request admission.  HOSPITAL COURSE:   * MSSA bacteremia    On IV cefazolin - needs this till 08/25/2018 Hemodialysis catheter appears to have been left in place when patient was recently discharged to hospice.  This has been removed.  Culture of catheter tip also growing staph aureus.  Patient seen by infectious disease.  Recommended TEE. CT of the spine showed no signs of infection .  Blood cultures negative.  TEE showed no endocarditis PICC line ordered.   Cefazolin set up for outpatient for 2-week treatment. Home health  * Right lower lobe pneumonia with small pleural effusion Patient initially placed empirically on IV antibiotics  initially with cefepime and azithromycin.  Now on cefazolin.  *  Hyponatremia; sodium level improved   *  Hyperkalemia with potassium of 6.0 Resolved.  *  Acute kidney injury; renal function improved Renal ultrasound consistent with HIV-associated nephropathy.  Nephrology following.  No indication for hemodialysis at this time.   *  History of HIV.   Seen by infectious disease specialist.  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 He does not have insurance.  Sent genotype. PCP prophylaxis with atovaquone Prescribed Biktarvy  * Anemia of chronic disease. Needed 1 unit packed RBC transfusion this admission  *  Unstageable sacral pressure injury. Present on admission.  Wound care following.  Home health to continue dressing changes  Stable for discharge home with home health services  CONSULTS OBTAINED:  Treatment Team:  Yolonda Kida, MD [facility]  DRUG ALLERGIES:  No Known Allergies  DISCHARGE MEDICATIONS:   Allergies as of 08/06/2018   No Known Allergies     Medication List    TAKE these medications   atovaquone 750 MG/5ML suspension Commonly known as: MEPRON Take 10 mLs (1,500 mg total) by mouth daily with breakfast.   bictegravir-emtricitabine-tenofovir AF 50-200-25 MG Tabs tablet Commonly known as: BIKTARVY Take 1 tablet by mouth daily.   ceFAZolin  IVPB Commonly known as: ANCEF Inject 2 g into the vein every 8 (eight) hours for 10 days. Indication:  MSSA bacteremia  Last Day of Therapy:  08/15/2018 Labs - Once weekly:  CBC/D and CMP   megestrol 400 MG/10ML suspension Commonly known as: MEGACE Take 10 mLs (400 mg total) by mouth 2 (two) times daily.            Home Infusion Instuctions  (From admission, onward)         Start     Ordered   08/04/18 0000  Home infusion instructions [facility] May follow [place] Dosing Protocol;  May administer Cathflo as needed to maintain patency of vascular access device.; [place] of vascular access device: per [facility] Protocol: 0.9% NaCl pre/post medica...    Question Answer Comment  Instructions May follow [place] Dosing Protocol   Instructions May administer Cathflo as needed to maintain patency of vascular access device.   Instructions Flushing of vascular access device: per [facility] Protocol: 0.9% NaCl pre/post medication administration and prn patency; Heparin 100 u/ml, 65ml for implanted ports and Heparin 10u/ml, 64ml for all other central venous catheters.   Instructions May follow [facility] Anaphylaxis Protocol for First Dose Administration in the home: 0.9% NaCl at 25-50 ml/hr to maintain IV access for protocol meds. Epinephrine 0.3 ml IV/IM PRN and Benadryl 25-50 IV/IM PRN s/s of anaphylaxis.   Instructions [facility] Infusion Coordinator (RN) to assist per patient IV care needs in the home PRN.      08/04/18 1410          Today   VITAL SIGNS:  Blood pressure 109/73, pulse 96, temperature 99.6 F (37.6 C), temperature source Oral, resp. rate 15, height 6\' 4"  (1.93 m), weight 68 kg, SpO2 100 %.  I/O:  No intake or output data in the 24 hours ending 08/07/18 1609  PHYSICAL EXAMINATION:  Physical Exam  GENERAL:  48 y.o.-year-old patient lying in the bed with no acute distress.  LUNGS: Normal breath sounds bilaterally, no wheezing, rales,rhonchi or crepitation. No use of accessory muscles of respiration.  CARDIOVASCULAR: S1, S2 normal. No murmurs, rubs, or gallops.  ABDOMEN: Soft, non-tender, non-distended. Bowel sounds present. No organomegaly or mass.  NEUROLOGIC: Moves all 4 extremities. PSYCHIATRIC: The patient is alert and oriented x 3.  SKIN: Sacral ulcer  DATA REVIEW:   CBC Recent Labs  Lab 08/02/18 0437  WBC 7.2  HGB 7.7*  HCT 22.6*  PLT 209    Chemistries  Recent Labs  Lab 08/01/18 0042 08/02/18 0437  NA 128* 132*  K 3.8 3.8  CL 100 102   CO2 23 22  GLUCOSE 144* 116*  BUN 19 16  CREATININE 0.80 0.79  CALCIUM 7.4* 7.7*  MG 1.6* 1.8  AST 35  --   ALT 14  --   ALKPHOS 168*  --   BILITOT 0.7  --     Cardiac Enzymes No results for input(s): TROPONINI in the last 168 hours.  Microbiology Results  Results for orders placed or performed during the hospital encounter of 07/29/18  Culture, blood (routine x 2)     Status: Abnormal   Collection Time: 07/29/18  3:50 PM   Specimen: BLOOD  Result Value Ref Range Status   Specimen Description   Final    BLOOD RIGHT ARM Performed at [facility], [address] [place], [place] [address]    Special Requests   Final    BOTTLES DRAWN AEROBIC AND ANAEROBIC Blood Culture adequate volume Performed at [facility], [address], [place], [place] [address]    Culture  Setup  Time   Final    GRAM POSITIVE COCCI IN BOTH AEROBIC AND ANAEROBIC BOTTLES CRITICAL RESULT CALLED TO, READ BACK BY AND VERIFIED WITH: CHARLES SHANLEVER AT 0804 ON 07/30/2018 [place]. Performed at [facility], [address] [place], [place] [address]    Culture (A)  Final    STAPHYLOCOCCUS AUREUS SUSCEPTIBILITIES PERFORMED ON PREVIOUS CULTURE WITHIN THE LAST 5 DAYS. Performed at [facility], [place] [address] [place], [place] [address]    Report Status 08/01/2018 FINAL  Final  Culture, blood (routine x 2)     Status: Abnormal   Collection Time: 07/29/18  3:50 PM   Specimen: BLOOD  Result Value Ref Range Status   Specimen Description   Final    BLOOD LEFT ARM Performed at [facility], [address], [place], [place] [address]    Special Requests   Final    BOTTLES DRAWN AEROBIC AND ANAEROBIC Blood Culture adequate volume Performed at [facility], [address] [place], [place] [address]    Culture  Setup Time   Final    GRAM POSITIVE COCCI IN BOTH AEROBIC AND ANAEROBIC BOTTLES CRITICAL RESULT CALLED TO, READ BACK BY AND VERIFIED  WITH: CHARLES SHANLEVER AT 0804 ON 07/30/2018 [place]. Performed at [facility], [place] [address], [place],  [address]    Culture STAPHYLOCOCCUS AUREUS (A)  Final   Report Status 08/01/2018 FINAL  Final   Organism ID, Bacteria STAPHYLOCOCCUS AUREUS  Final      Susceptibility   Staphylococcus aureus - MIC*    CIPROFLOXACIN <=0.5 SENSITIVE Sensitive     ERYTHROMYCIN >=8 RESISTANT Resistant     GENTAMICIN <=0.5 SENSITIVE Sensitive     OXACILLIN <=0.25 SENSITIVE Sensitive     TETRACYCLINE <=1 SENSITIVE Sensitive     VANCOMYCIN <=0.5 SENSITIVE Sensitive     TRIMETH/SULFA <=10 SENSITIVE Sensitive     CLINDAMYCIN RESISTANT Resistant     RIFAMPIN <=0.5 SENSITIVE Sensitive     Inducible Clindamycin POSITIVE Resistant     * STAPHYLOCOCCUS AUREUS  Blood Culture ID Panel (Reflexed)     Status: Abnormal   Collection Time: 07/29/18  3:50 PM  Result Value Ref Range Status   Enterococcus species NOT DETECTED NOT DETECTED Final   Listeria monocytogenes NOT DETECTED NOT DETECTED Final   Staphylococcus species DETECTED (A) NOT DETECTED Final    Comment: CRITICAL RESULT CALLED TO, READ BACK BY AND VERIFIED WITH: CHARLES SHANLEVER AT 0804 ON 07/30/2018 [place].    Staphylococcus aureus (BCID) DETECTED (A) NOT DETECTED Final    Comment: Methicillin (oxacillin) susceptible Staphylococcus aureus (MSSA). Preferred therapy is anti staphylococcal beta lactam antibiotic (Cefazolin or Nafcillin), unless clinically contraindicated. CRITICAL RESULT CALLED TO, READ BACK BY AND VERIFIED WITH: CHARLES SHANLEVER AT 0804 ON 07/30/2018 [place].    Methicillin resistance NOT DETECTED NOT DETECTED Final   Streptococcus species NOT DETECTED NOT DETECTED Final   Streptococcus agalactiae NOT DETECTED NOT DETECTED Final   Streptococcus pneumoniae NOT DETECTED NOT DETECTED Final   Streptococcus pyogenes NOT DETECTED NOT DETECTED Final   Acinetobacter baumannii NOT DETECTED NOT DETECTED Final   Enterobacteriaceae species NOT  DETECTED NOT DETECTED Final   Enterobacter cloacae complex NOT DETECTED NOT DETECTED Final   Escherichia coli NOT DETECTED NOT DETECTED Final   Klebsiella oxytoca NOT DETECTED NOT DETECTED Final   Klebsiella pneumoniae NOT DETECTED NOT DETECTED Final   Proteus species NOT DETECTED NOT DETECTED Final   Serratia marcescens NOT DETECTED NOT DETECTED Final  Haemophilus influenzae NOT DETECTED NOT DETECTED Final   Neisseria meningitidis NOT DETECTED NOT DETECTED Final   Pseudomonas aeruginosa NOT DETECTED NOT DETECTED Final   Candida albicans NOT DETECTED NOT DETECTED Final   Candida glabrata NOT DETECTED NOT DETECTED Final   Candida krusei NOT DETECTED NOT DETECTED Final   Candida parapsilosis NOT DETECTED NOT DETECTED Final   Candida tropicalis NOT DETECTED NOT DETECTED Final    Comment: Performed at [facility], [address] [place], [place] [address]  SARS Coronavirus 2 (CEPHEID - Performed in [place] hospital lab), Hosp Order     Status: None   Collection Time: 07/29/18  6:59 PM   Specimen: Nasopharyngeal Swab  Result Value Ref Range Status   SARS Coronavirus 2 NEGATIVE NEGATIVE Final    Comment: (NOTE) If result is NEGATIVE SARS-CoV-2 target nucleic acids are NOT DETECTED. The SARS-CoV-2 RNA is generally detectable in upper and lower  respiratory specimens during the acute phase of infection. The lowest  concentration of SARS-CoV-2 viral copies this assay can detect is 250  copies / mL. A negative result does not preclude SARS-CoV-2 infection  and should not be used as the sole basis for treatment or other  patient management decisions.  A negative result may occur with  improper specimen collection / handling, submission of specimen other  than nasopharyngeal swab, presence of viral mutation(s) within the  areas targeted by this assay, and inadequate number of viral copies  (<250 copies / mL). A negative result must be combined with clinical  observations,  patient history, and epidemiological information. If result is POSITIVE SARS-CoV-2 target nucleic acids are DETECTED. The SARS-CoV-2 RNA is generally detectable in upper and lower  respiratory specimens dur ing the acute phase of infection.  Positive  results are indicative of active infection with SARS-CoV-2.  Clinical  correlation with patient history and other diagnostic information is  necessary to determine patient infection status.  Positive results do  not rule out bacterial infection or co-infection with other viruses. If result is PRESUMPTIVE POSTIVE SARS-CoV-2 nucleic acids MAY BE PRESENT.   A presumptive positive result was obtained on the submitted specimen  and confirmed on repeat testing.  While 2019 novel coronavirus  (SARS-CoV-2) nucleic acids may be present in the submitted sample  additional confirmatory testing may be necessary for epidemiological  and / or clinical management purposes  to differentiate between  SARS-CoV-2 and other Sarbecovirus currently known to infect humans.  If clinically indicated additional testing with an alternate test  methodology [phone]) is advised. The SARS-CoV-2 RNA is generally  detectable in upper and lower respiratory sp ecimens during the acute  phase of infection. The expected result is Negative. Fact Sheet for Patients:  StrictlyIdeas.no Fact Sheet for Healthcare Providers: BankingDealers.co.za This test is not yet approved or cleared by the [place] FDA and has been authorized for detection and/or diagnosis of SARS-CoV-2 by FDA under an Emergency Use Authorization (EUA).  This EUA will remain in effect (meaning this test can be used) for the duration of the COVID-19 declaration under Section 564(b)(1) of the Act, 21 U.S.C. section 360bbb-3(b)(1), unless the authorization is terminated or revoked sooner. Performed at [facility], [address]  [place], [place] [address]   Cath Tip Culture     Status: Abnormal   Collection Time: 07/30/18  4:31 PM   Specimen: Catheter Tip; Other  Result Value Ref Range Status   Specimen Description   Final    CATH TIP  Performed at [facility], [address] [place], [place] [address]    Special Requests   Final    NONE Performed at [facility], [address] [place], [place] [address]    Culture STAPHYLOCOCCUS AUREUS (A)  Final   Report Status 08/01/2018 FINAL  Final   Organism ID, Bacteria STAPHYLOCOCCUS AUREUS  Final      Susceptibility   Staphylococcus aureus - MIC*    CIPROFLOXACIN 1 SENSITIVE Sensitive     ERYTHROMYCIN >=8 RESISTANT Resistant     GENTAMICIN <=0.5 SENSITIVE Sensitive     OXACILLIN 0.5 SENSITIVE Sensitive     TETRACYCLINE <=1 SENSITIVE Sensitive     VANCOMYCIN 1 SENSITIVE Sensitive     TRIMETH/SULFA <=10 SENSITIVE Sensitive     CLINDAMYCIN RESISTANT Resistant     RIFAMPIN <=0.5 SENSITIVE Sensitive     Inducible Clindamycin POSITIVE Resistant     * STAPHYLOCOCCUS AUREUS  CULTURE, BLOOD (ROUTINE X 2) w Reflex to ID Panel     Status: None   Collection Time: 08/01/18 12:42 AM   Specimen: BLOOD  Result Value Ref Range Status   Specimen Description BLOOD RIGHT ANTECUBITAL  Final   Special Requests   Final    BOTTLES DRAWN AEROBIC AND ANAEROBIC Blood Culture results may not be optimal due to an excessive volume of blood received in culture bottles   Culture   Final    NO GROWTH 5 DAYS Performed at [facility], [address] [place], [place] [address]    Report Status 08/06/2018 FINAL  Final  CULTURE, BLOOD (ROUTINE X 2) w Reflex to ID Panel     Status: None   Collection Time: 08/01/18 12:47 AM   Specimen: BLOOD  Result Value Ref Range Status   Specimen Description BLOOD BLOOD RIGHT HAND  Final   Special Requests   Final    BOTTLES DRAWN AEROBIC AND ANAEROBIC Blood Culture results may not be optimal due to an excessive volume of  blood received in culture bottles   Culture   Final    NO GROWTH 5 DAYS Performed at [facility], [address], [place], [place] [address]    Report Status 08/06/2018 FINAL  Final    RADIOLOGY:  No results found.  Follow up with PCP in 1 week.  Management plans discussed with the patient, family and they are in agreement.  CODE STATUS:  Code Status History    Date Active Date Inactive Code Status Order ID Comments User Context   07/29/2018 2136 08/06/2018 2018 Full Code [id number]  Demetrios Loll, MD Inpatient   07/01/2018 1634 07/10/2018 1504 DNR [id number]  Tyler Pita, MD Inpatient   07/01/2018 0425 07/01/2018 1634 Full Code [id number]  Mansy, Arvella Merles, MD ED   03/18/2018 0510 03/21/2018 1525 Full Code [id number]  Harrie Foreman, MD Inpatient   Advance Care Planning Activity      TOTAL TIME TAKING CARE OF THIS PATIENT ON DAY OF DISCHARGE: more than 30 minutes.   Leia Alf Rondia Higginbotham M.D on 08/07/2018 at 4:09 PM  Between 7am to 6pm - Pager - [phone]  After 6pm go to [email or web address] - password [email or web address] [facility]  Office  [phone]  CC: Primary care physician; Patient, No Pcp Per  Note: This dictation was prepared with Dragon dictation along with smaller phrase technology. Any transcriptional errors that result from this process are unintentional.

## 2018-08-08 ENCOUNTER — Telehealth: Payer: Self-pay | Admitting: Nurse Practitioner

## 2018-08-08 NOTE — Telephone Encounter (Signed)
I initially called patient's number and it went straight to voicemail and unable to leave message due to mailbox was full.  I then called patient's mother Joshua Dougherty and explained to her that I was trying to reach Joshua Dougherty and she then put Joshua Dougherty on the phone and I explained Palliative services to her.  She gave the phone to Joshua Dougherty and I was able to get verbal consent from patient.  I then scheduled a Telephone Palliative consult for 08/14/18 @ 1 PM.

## 2018-08-14 ENCOUNTER — Other Ambulatory Visit: Payer: Self-pay

## 2018-08-14 ENCOUNTER — Other Ambulatory Visit: Payer: Self-pay | Admitting: Nurse Practitioner

## 2018-08-14 ENCOUNTER — Encounter: Payer: Self-pay | Admitting: Nurse Practitioner

## 2018-08-14 DIAGNOSIS — Z515 Encounter for palliative care: Secondary | ICD-10-CM | POA: Insufficient documentation

## 2018-08-14 DIAGNOSIS — R630 Anorexia: Secondary | ICD-10-CM | POA: Insufficient documentation

## 2018-08-14 DIAGNOSIS — R531 Weakness: Secondary | ICD-10-CM

## 2018-08-14 NOTE — Progress Notes (Signed)
[facility] Consult Note Telephone: [phone]  Fax: [phone]  PATIENT NAME: Joshua Dougherty DOB: January 11, 1971 MRN: [id number]  PRIMARY CARE PROVIDER:   Patient, No Pcp Per  REFERRING PROVIDER:  Alwyn Pea, NP .  ,  [place] [address]  RESPONSIBLE PARTY:   Self  Due to the COVID-19 crisis, this visit was done via telemedicine from my office and it was initiated and consent by this patient and or family.  I was asked by Joshua Pea NP to see Joshua Dougherty for Palliative care consult for goc  RECOMMENDATIONS and PLAN:  1.Palliative care encounter Z51.5; Palliative medicine team will continue to support patient, patient's family, and medical team. Visit consisted of counseling and education dealing with the complex and emotionally intense issues of symptom management and palliative care in the setting of serious and potentially life-threatening illness  2. Generalized weakness R53.1  secondary to deconditioning continue with therapy as able. Encourage energy conservation and rest times.  3. Anorexia R63.0 appetite remaining declined. Continue to encourage supplements and comfort feedings. Discuss nutrition  ASSESSMENT:     I called to complete a telemedicine visit with Joshua Dougherty. We talked about purpose of palliative care visit and he was in agreement. We talked about past medical history in the setting of chronic disease of HIV. We talked about recent hospitalizations. We talked about hospice home. We talked about catastrophic events that he endured including hemodialysis which he now does not require. We talked about follow up appointments with Nephrology. He does not have one scheduled as of yet. He does have follow-up appointment schedule with Dr Joshua Dougherty for Primary Care and infectious disease. We talked about the medications he's currently on for HIV. We talked about the importance of compliance and keeping appointments. We talked about prior to  initial hospitalization in June he currently was not on any medications for HIV. He shared at that time he was not taking care of himself but now he knows he needs to. He is committed to doing that. We talked about symptoms of pain what he currently is not experiencing. We talked about shortness of breath which he is not experiencing. We talked about his appetite being poor but slowly improving. He is having no difficulty with swallowing. We talked about nutritional supplements. We talked about medical goals of care including aggressive versus conservative versus comfort care. We talked about prior DNR and then he changed his mind to a full code. His wishes are to be a full code, aggressive interventions. He wants everything done to keep him alive. We talked about role of palliative care and plan of care. Schedule to follow up palliative care visit for 4 weeks if needed or sooner should he declined. He shared that Joshua Dougherty should be starting this week. Asked if it was okay to have palliative social worker touch base for emotional support, also further discussion about compliance with provider appointments. Joshua Joshua Dougherty in agreement. Therapeutic listening and emotional support provided. Scheduled palliative care follow-up visit. Contact information provided. Questions answered to satisfaction.  I spent 60 minutes providing this consultation,  from 1:00pm to 2:00pm. More than 50% of the time in this consultation was spent coordinating communication.   HISTORY OF PRESENT ILLNESS:  Joshua Dougherty is a 48 y.o. year old male with multiple medical problems including HIV, TV, chronic kidney disease previously on hemodialysis. He was seen hospice home but did not receive any medications are treatments. He left the hospice home to  come to the emergency department for treatment from hopitalized 6 / 9 / 2020 to 6 / 18 / 2020 for acute respiratory failure with hypoxia, healthcare-associated pneumonia. Septic shock secondary to  bilateral pneumonia, polymicrobial bacteremia was staphylococcus aureus, E coli, GBS Source likely long. He did require pressors, intubation, crrt by Nephrology and transition to hemodialysis. HIV recent CD4 count 403 not on Elnoria Howard, not engaged in care as per ID documentation with prognosis high risk for cardiac arrest. All right then when you get to Palliative care consult and family's wishes were for comfort care and transition to hospice home. Hospitalize 7 / 7 / 2022 7 / 15 / 2020 from hospice home to come to the emergency department with right-sided pneumonia. Hemodialysis catheter appeared to be left in place removed and cultured with growing staph aureus, seen by infectious disease. Recommended TEE which showed endocarditis. scan spine showed no infection. Blood cultures negative. Hyponatremia with hyperkalemia resolved acute kidney injury with renal function improved. Renal ultrasound consistent with hiv-associated nephropathy with Nephrology following and no indication for hemodialysis at that time. History of HIV Advanced/AIDS with CD4 of 45 and viral load of more than 1 million copies. He is interested in starting treatment, did not have insurance and genotype was sent. He was prescribed Biktarvy and PCP prophylaxis with Atovaquone. Anemia of chronic disease is required when unit to be transfused. Unstageable sacral ulcer with Home Health to continue to follow with dressing changes. He was a DNR but change to a full code. He currently is residing at his home with his mother who helps care for him. Palliative Care was asked to help address goals of care.   CODE STATUS: Full code  PPS: 40% HOSPICE ELIGIBILITY/DIAGNOSIS: TBD  PAST MEDICAL HISTORY:  Past Medical History:  Diagnosis Date   HIV (human immunodeficiency virus infection) ([place])    Renal disorder    Tuberculosis     SOCIAL HX:  Social History   Tobacco Use   Smoking status: Former Smoker    Packs/day: 0.25    Years: 20.00     Pack years: 5.00   Smokeless tobacco: Never Used   Tobacco comment: per pt has not smoked in last 30 days  Substance Use Topics   Alcohol use: Yes    Alcohol/week: 2.0 - 3.0 standard drinks    Types: 2 - 3 Standard drinks or equivalent per week    Frequency: Never    ALLERGIES: No Known Allergies   PERTINENT MEDICATIONS:  Outpatient Encounter Medications as of 08/14/2018  Medication Sig   atovaquone (MEPRON) 750 MG/5ML suspension Take 10 mLs (1,500 mg total) by mouth daily with breakfast.   bictegravir-emtricitabine-tenofovir AF (BIKTARVY) 50-200-25 MG TABS tablet Take 1 tablet by mouth daily.   ceFAZolin (ANCEF) IVPB Inject 2 g into the vein every 8 (eight) hours for 10 days. Indication:  MSSA bacteremia Last Day of Therapy:  08/15/2018 Labs - Once weekly:  CBC/D and CMP   megestrol (MEGACE) 400 MG/10ML suspension Take 10 mLs (400 mg total) by mouth 2 (two) times daily.   sulfamethoxazole-trimethoprim (BACTRIM) 400-80 MG tablet Take 1 tablet by mouth 3 (three) times a week.   No facility-administered encounter medications on file as of 08/14/2018.     PHYSICAL EXAM:   Deferred  Egon Dittus Z Quanah Majka, NP

## 2018-08-15 ENCOUNTER — Telehealth: Payer: Self-pay | Admitting: Licensed Clinical Social Worker

## 2018-08-15 NOTE — Telephone Encounter (Signed)
Spoke to patient as well. Let us ask the nurse to draw blood culture on Wednesday.thx

## 2018-08-15 NOTE — Telephone Encounter (Signed)
Spoke with Carlota Raspberry, RN with [facility] and she will go out to remove PICC tomorrow. Patient's last dose is today. Patient is now ok with PICC coming out and ending antibiotics.

## 2018-08-18 NOTE — Telephone Encounter (Signed)
Spoke with Carlota Raspberry, RN and she will draw blood cultures on Wednesday 08/20/2018.

## 2018-08-19 ENCOUNTER — Telehealth: Payer: Self-pay

## 2018-08-19 NOTE — Telephone Encounter (Signed)
Patient received referral from [place], NP requesting follow-up with patient about appointments, coordination of care and emotional support. SW contacted patient. Patient reports that he is doing "okay". Patient currently lives with his mother but said that he also has support from his sister and his daughter's mother. Patient has a 49 year old daughter. Patient provided brief history. Patient currently receives home health from [facility] -- a RN visits three days/week for wound care and PT is planning to visit two days/week. Patient said physically he is improving and walking better but does have trouble with balance. Discussed safety precautions with patient. Patient reports that he is pending Medicaid. Patient needs a PCP, stated that he was to see Dr. Doy Hutching but they are not accepting any new Medicaid patients at this time. Patient said he would like information local PCP offices that accept Medicaid because he needs to be seen as soon as possible. SW to follow-up.  SW contacted [employer] in [place], they can see patient on sliding scale fee until Medicaid is approved. SW notified patient and he is going to call and schedule an appointment. Patient also asked about handicap placard, SW to discuss with NP.

## 2018-08-21 ENCOUNTER — Ambulatory Visit: Payer: Self-pay | Attending: Infectious Diseases | Admitting: Infectious Diseases

## 2018-08-21 ENCOUNTER — Other Ambulatory Visit: Payer: Self-pay

## 2018-08-21 DIAGNOSIS — Z8619 Personal history of other infectious and parasitic diseases: Secondary | ICD-10-CM

## 2018-08-21 DIAGNOSIS — Z8615 Personal history of latent tuberculosis infection: Secondary | ICD-10-CM

## 2018-08-21 DIAGNOSIS — B20 Human immunodeficiency virus [HIV] disease: Secondary | ICD-10-CM

## 2018-08-21 DIAGNOSIS — Z79899 Other long term (current) drug therapy: Secondary | ICD-10-CM

## 2018-08-21 NOTE — Progress Notes (Signed)
NAME: Joshua Dougherty  DOB: 25-Feb-1970  MRN: [id number]  Date/Time: 08/21/2018 9:30 AM   Subjective:  The purpose of this virtual visit is to provide medical care while limiting exposure to the novel coronavirus (COVID19) for both patient and office staff.   Consent was obtained for phone visit:  Yes.   Answered questions that patient had about telehealth interaction:  Yes.    Patient Location: Home Provider Location: office Follow-up telephone visit after recent hospitalization. ? Joshua Dougherty is a 48 y.o. with a history of AIDS, latent TB treated, recent MSSA bacteremia treated treated with IV cefazolin for 2 weeks after removal of the dialysis catheter in his left groin.  He completed his antibiotic last Friday and the PICC line was removed.  Has been doing well.  He is also on Biktarvy.  He is find some improvement in his general condition.  He is not as much fatigue as before.  No fever or chills or night sweats or shortness of breath or cough or abdominal pain or diarrhea.  Appetite is very poor.  He does not have a taste.  His last viral load was 1.4 million copies and CD4 was 45 from June 2020.  He is followed at the [facility] by NP. Alwyn Pea.  He has only had virtual visit so far. His first month of medication was obtained from [facility].  His Medicaid paperwork is being processed ? Impression/Recommendation ? 48 year old male  Recent MSSA bacteremia secondary to a dialysis catheter which she had temporarily placed.  It has been removed and the blood cultures is negative.  he has completed 2 weeks of antibiotics.  Repeat blood culture has been sent and it still pending.  AIDS on Biktarvy.  Last viral load 1.4 million copies and CD4 45 he is also on Bactrim for PCP prophylaxis He is followed by [facility] with nurse practitioner Albin Fischer ?  Latent TB was treated many years ago when he was in [place].  Discussed the management with the patient and he  will follow-up at the [facility]  ? ___________________________________________________ Discussed with patient, requesting provider Note:  This document was prepared using Dragon voice recognition software and may include unintentional dictation errors.

## 2018-08-22 ENCOUNTER — Other Ambulatory Visit: Payer: Self-pay | Admitting: Licensed Clinical Social Worker

## 2018-08-22 DIAGNOSIS — N08 Glomerular disorders in diseases classified elsewhere: Secondary | ICD-10-CM

## 2018-08-22 DIAGNOSIS — Z21 Asymptomatic human immunodeficiency virus [HIV] infection status: Secondary | ICD-10-CM

## 2018-08-22 DIAGNOSIS — B20 Human immunodeficiency virus [HIV] disease: Secondary | ICD-10-CM

## 2018-08-22 LAB — GENOSURE PRIME (GSPRIL)

## 2018-08-22 MED ORDER — BICTEGRAVIR-EMTRICITAB-TENOFOV 50-200-25 MG PO TABS: 1.0000 | ORAL_TABLET | Freq: Every day | ORAL | 2 refills | Status: AC

## 2018-08-22 MED ORDER — BICTEGRAVIR-EMTRICITAB-TENOFOV 50-200-25 MG PO TABS: 1.0000 | ORAL_TABLET | Freq: Every day | ORAL | 2 refills | Status: DC

## 2018-08-27 LAB — MISC LABCORP TEST (SEND OUT): Labcorp test code: 8482

## 2018-09-05 ENCOUNTER — Emergency Department
Admission: EM | Admit: 2018-09-05 | Discharge: 2018-09-05 | Disposition: A | Discharge status: Home / Self Care | Payer: Medicaid Other | Attending: Emergency Medicine | Admitting: Emergency Medicine

## 2018-09-05 ENCOUNTER — Other Ambulatory Visit: Payer: Self-pay

## 2018-09-05 DIAGNOSIS — M7989 Other specified soft tissue disorders: Secondary | ICD-10-CM | POA: Diagnosis present

## 2018-09-05 DIAGNOSIS — I82412 Acute embolism and thrombosis of left femoral vein: Secondary | ICD-10-CM | POA: Insufficient documentation

## 2018-09-05 DIAGNOSIS — Z87891 Personal history of nicotine dependence: Secondary | ICD-10-CM | POA: Insufficient documentation

## 2018-09-05 LAB — CBC WITH DIFFERENTIAL/PLATELET
Abs Immature Granulocytes: 0.02 10*3/uL (ref 0.00–0.07)
Basophils Absolute: 0 10*3/uL (ref 0.0–0.1)
Basophils Relative: 1 %
Eosinophils Absolute: 0.3 10*3/uL (ref 0.0–0.5)
Eosinophils Relative: 6 %
HCT: 28.6 % — ABNORMAL LOW (ref 39.0–52.0)
Hemoglobin: 9.4 g/dL — ABNORMAL LOW (ref 13.0–17.0)
Immature Granulocytes: 1 %
Lymphocytes Relative: 47 %
Lymphs Abs: 2.1 10*3/uL (ref 0.7–4.0)
MCH: 33 pg (ref 26.0–34.0)
MCHC: 32.9 g/dL (ref 30.0–36.0)
MCV: 100.4 fL — ABNORMAL HIGH (ref 80.0–100.0)
Monocytes Absolute: 0.5 10*3/uL (ref 0.1–1.0)
Monocytes Relative: 11 %
Neutro Abs: 1.5 10*3/uL — ABNORMAL LOW (ref 1.7–7.7)
Neutrophils Relative %: 34 %
Platelets: 298 10*3/uL (ref 150–400)
RBC: 2.85 MIL/uL — ABNORMAL LOW (ref 4.22–5.81)
RDW: 16.6 % — ABNORMAL HIGH (ref 11.5–15.5)
WBC: 4.4 10*3/uL (ref 4.0–10.5)
nRBC: 0 % (ref 0.0–0.2)

## 2018-09-05 LAB — COMPREHENSIVE METABOLIC PANEL
ALT: 17 U/L (ref 0–44)
AST: 29 U/L (ref 15–41)
Albumin: 2.6 g/dL — ABNORMAL LOW (ref 3.5–5.0)
Alkaline Phosphatase: 105 U/L (ref 38–126)
Anion gap: 6 (ref 5–15)
BUN: 12 mg/dL (ref 6–20)
CO2: 26 mmol/L (ref 22–32)
Calcium: 8.3 mg/dL — ABNORMAL LOW (ref 8.9–10.3)
Chloride: 105 mmol/L (ref 98–111)
Creatinine, Ser: 0.75 mg/dL (ref 0.61–1.24)
GFR calc Af Amer: >60 mL/min (ref >60)
GFR calc non Af Amer: >60 mL/min (ref >60)
Glucose, Bld: 140 mg/dL — ABNORMAL HIGH (ref 70–99)
Potassium: 3.9 mmol/L (ref 3.5–5.1)
Sodium: 137 mmol/L (ref 135–145)
Total Bilirubin: 0.6 mg/dL (ref 0.3–1.2)
Total Protein: 7.2 g/dL (ref 6.5–8.1)

## 2018-09-05 MED ORDER — APIXABAN 5 MG PO TABS: 10.0000 mg | ORAL_TABLET | Freq: Two times a day (BID) | ORAL | 0 refills | Status: DC

## 2018-09-05 MED ORDER — APIXABAN 5 MG PO TABS
10.0000 mg | ORAL_TABLET | Freq: Once | ORAL | Status: AC
  Administered 2018-09-05: 10 mg via ORAL
  Filled 2018-09-05: qty 2

## 2018-09-05 NOTE — ED Notes (Signed)
Pt updated. Makes statement about a bedsore being the reason he is laying on his side. Denies pain otherwise. Confirms he had wound care following at home and now has a nurse come to check it weekly.

## 2018-09-05 NOTE — ED Provider Notes (Signed)
[facility] Emergency Department Provider Note       Time seen: ----------------------------------------- 9:50 AM on 09/05/2018 -----------------------------------------   I have reviewed the triage vital signs and the nursing notes.  HISTORY   Chief Complaint Leg Swelling and Other (DVT)    HPI Joshua Dougherty is a 48 y.o. male with a history of HIV, renal disorder, tuberculosis who presents to the ED for possible DVT in the left leg.  Patient states he left the hospital recently where he had been admitted from the hospice home for sepsis from an infected hemodialysis catheter and was discharged with a PICC line, he also had a right lower lobe pneumonia.  He had hyponatremia and hyperkalemia as well as acute kidney injury in the setting of AIDS.  Reportedly he was at an outpatient ultrasound facility in [facility] which diagnosed a DVT today.  Past Medical History:  Diagnosis Date  . HIV (human immunodeficiency virus infection) ([place])   . Renal disorder   . Tuberculosis     Patient Active Problem List   Diagnosis Date Noted  . Anorexia 08/14/2018  . Generalized weakness 08/14/2018  . Palliative care encounter 08/14/2018  . Bacteremia due to Staphylococcus aureus 08/02/2018  . Pressure injury of skin 07/30/2018  . Pneumonia 07/29/2018  . 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 CAP (community acquired pneumonia) 07/01/2018  . Protein-calorie malnutrition, severe 03/19/2018  . Hemoptysis 03/18/2018  . Tobacco abuse 05/20/2014  . Asymptomatic HIV infection ([place]) 05/20/2014    Past Surgical History:  Procedure Laterality Date  . ABSCESS DRAINAGE  2013   chest wall; patient states it was not contiguous with his lung however this was an operation under general anesthesia  . FLEXIBLE BRONCHOSCOPY Right 03/28/2018   Procedure: FLEXIBLE BRONCHOSCOPY, RIGHT;  Surgeon: Laverle Hobby, MD;  Location: [facility] ORS;  Service: Pulmonary;  Laterality: Right;  .  TEE WITHOUT CARDIOVERSION N/A 08/01/2018   Procedure: TRANSESOPHAGEAL ECHOCARDIOGRAM (TEE);  Surgeon: Teodoro Spray, MD;  Location: [facility] ORS;  Service: Cardiovascular;  Laterality: N/A;  . TEE WITHOUT CARDIOVERSION N/A 08/04/2018   Procedure: TRANSESOPHAGEAL ECHOCARDIOGRAM (TEE);  Surgeon: Teodoro Spray, MD;  Location: [facility] ORS;  Service: Cardiovascular;  Laterality: N/A;    Allergies Patient has no known allergies.  Social History Social History   Tobacco Use  . Smoking status: Former Smoker    Packs/day: 0.25    Years: 20.00    Pack years: 5.00  . Smokeless tobacco: Never Used  . Tobacco comment: per pt has not smoked in last 30 days  Substance Use Topics  . Alcohol use: Yes    Alcohol/week: 2.0 - 3.0 standard drinks    Types: 2 - 3 Standard drinks or equivalent per week    Frequency: Never  . Drug use: No    Review of Systems Constitutional: Negative for fever. Cardiovascular: Negative for chest pain. Respiratory: Negative for shortness of breath. Gastrointestinal: Negative for abdominal pain, vomiting and diarrhea. Musculoskeletal: Left leg swelling Skin: Negative for rash. Neurological: Negative for headaches, focal weakness or numbness.  All systems negative/normal/unremarkable except as stated in the HPI  ____________________________________________   PHYSICAL EXAM:  VITAL SIGNS: ED Triage Vitals [09/05/18 0936]  Enc Vitals Group     BP (!) 149/94     Pulse Rate 66     Resp 18     Temp 98.1 F (36.7 C)     Temp Source Oral     SpO2 100 %     Weight 150  lb (68 kg)     Height _0  (1.93 m)     Head Circumference      Peak Flow      Pain Score 0     Pain Loc      Pain Edu?      Excl. in [place]?     Constitutional: Alert and oriented.  Chronically ill-appearing, no distress Eyes: Conjunctivae are normal. Normal extraocular movements. Cardiovascular: Normal rate, regular rhythm. No murmurs, rubs, or gallops. Respiratory: Normal respiratory effort  without tachypnea nor retractions. Breath sounds are clear and equal bilaterally. No wheezes/rales/rhonchi. Gastrointestinal: Soft and nontender. Normal bowel sounds Musculoskeletal: Left lower extremity swelling compared to right Neurologic:  Normal speech and language. No gross focal neurologic deficits are appreciated.  Skin:  Skin is warm, dry and intact. No rash noted. Psychiatric: Mood and affect are normal. Speech and behavior are normal.  ____________________________________________  ED COURSE:  As part of my medical decision making, I reviewed the following data within the [place] History obtained from family if available, nursing notes, old chart and ekg, as well as notes from prior ED visits. Patient presented for probable DVT in the left leg, we will assess with labs and imaging as indicated at this time.   Procedures  Joshua Dougherty was evaluated in Emergency Department on 09/05/2018 for the symptoms described in the history of present illness. He was evaluated in the context of the global COVID-19 pandemic, which necessitated consideration that the patient might be at risk for infection with the SARS-CoV-2 virus that causes COVID-19. Institutional protocols and algorithms that pertain to the evaluation of patients at risk for COVID-19 are in a state of rapid change based on information released by regulatory bodies including the CDC and federal and state organizations. These policies and algorithms were followed during the patient's care in the ED.  ____________________________________________   LABS (pertinent positives/negatives)  Labs Reviewed  CBC WITH DIFFERENTIAL/PLATELET - Abnormal; Notable for the following components:      Result Value   RBC 2.85 (*)    Hemoglobin 9.4 (*)    HCT 28.6 (*)    MCV 100.4 (*)    RDW 16.6 (*)    Neutro Abs 1.5 (*)    All other components within normal limits  COMPREHENSIVE METABOLIC PANEL - Abnormal; Notable for the  following components:   Glucose, Bld 140 (*)    Calcium 8.3 (*)    Albumin 2.6 (*)    All other components within normal limits    RADIOLOGY  Left lower extremity ultrasound Left Technical Summary Evidence of acute obstruction in the left common femoral vein, femoral vein at proximal thigh and PFV. Evidence of acute obstruction in the left femoral vein at mid thigh and femoral vein at distal thigh. 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 Patient was instructed by provider to report to the local ED. Patient has been symptomatic for about a month which may explain the mixed chronicity of the thrombus. ____________________________________________   DIFFERENTIAL DIAGNOSIS   DVT, electrolyte abnormality, renal failure  FINAL ASSESSMENT AND PLAN  DVT   Plan: The patient had presented for left lower extremity swelling. Patient's labs were unremarkable for him. Patient's imaging did reveal acute obstruction of the left common femoral vein with evidence of chronic obstruction in the left popliteal.  He was discussed with vascular surgery, given his issues he was placed on Eliquis and will follow-up with vascular surgery next  week.   Laurence Aly, MD    Note: This note was generated in part or whole with voice recognition software. Voice recognition is usually quite accurate but there are transcription errors that can and very often do occur. I apologize for any typographical errors that were not detected and corrected.     Earleen Newport, MD 09/05/18 [phone]

## 2018-09-05 NOTE — ED Triage Notes (Signed)
Sent to ER after US revealed DVT in probable left leg. Hx of same since July. Left leg swelling. Pt in not currently on blood thinners. Pt alert and oriented X4, active, cooperative, pt in NAD. RR even and unlabored, color WNL.

## 2018-09-09 ENCOUNTER — Encounter: Payer: Self-pay | Admitting: Nurse Practitioner

## 2018-09-09 ENCOUNTER — Other Ambulatory Visit: Payer: Self-pay | Admitting: Nurse Practitioner

## 2018-09-09 DIAGNOSIS — Z515 Encounter for palliative care: Secondary | ICD-10-CM

## 2018-09-09 NOTE — Progress Notes (Signed)
[employer] [facility] Palliative Care Consult Note Telephone: [phone]  Fax: [phone]  PATIENT NAME: Joshua Dougherty DOB: 16-Aug-1970 MRN: [id number]  PRIMARY CARE PROVIDER:   [facility] REFERRING PROVIDER: [facility] RESPONSIBLE PARTY:   Self  Due to the COVID-19 crisis, this visit was done via telemedicine from my office and it was initiated and consent by this patient and or family.  RECOMMENDATIONS and PLAN:  1.ACP; Full code with aggressive interventions and treatment options  2. Generalized weaknessR53.1  secondary to deconditioning continue with therapy as able. Encourage energy conservation and rest times.  3. Anorexia R63.0 appetite improving. Continue to encourage supplements and comfort feedings. Discuss nutrition  4. Palliative care encounter Z51.5; Palliative medicine team will continue to support patient, patient's family, and medical team. Visit consisted of counseling and education dealing with the complex and emotionally intense issues of symptom management and palliative care in the setting of serious and potentially life-threatening illness  I spent 40 minutes providing this consultation,  from 3:00pm to 3:40pm. More than 50% of the time in this consultation was spent coordinating communication.   HISTORY OF PRESENT ILLNESS:  Joshua Dougherty is a 48 y.o. year old male with multiple medical problems including HIV/AIDS, TB, chronic kidney disease previously on hemodialysis. He was seen hospice home but did not receive any medications are treatments. He left the hospice home to come to the emergency department for treatment from hopitalized 6 / 9 / 2020 to 6 / 18 / 2020 for acute respiratory failure with hypoxia, healthcare-associated pneumonia. Septic shock secondary to bilateral pneumonia, polymicrobial bacteremia was staphylococcus aureus, E coli, GBS Source likely long. He did require pressors, intubation, crrt by  Nephrology and transition to hemodialysis. HIV recent CD4 count 403 not on Elnoria Howard, not engaged in care as per ID documentation with prognosis high risk for cardiac arrest. Palliative care consult and family's wishes were for comfort care and transition to hospice home. Hospitalize 7 / 7 / 2020 to 7 / 15 / 2020 from hospice home to come to the emergency department with right-sided pneumonia. Hemodialysis catheter appeared to be left in place removed and cultured with growing staph aureus, saw by infectious disease. Recommended TEE which showed endocarditis. scan spine showed no infection. Blood cultures negative. Hyponatremia with hyperkalemia resolved acute kidney injury with renal function improved. Renal ultrasound consistent with hiv-associated nephropathy with Nephrology following and no indication for hemodialysis at that time. History of HIV Advanced/AIDS with CD4 of 45 and viral load of more than 1 million copies. He is interested in starting treatment, did not have insurance and genotype was sent. He was prescribed Biktarvy and PCP prophylaxis with Atovaquone. Anemia of chronic disease is required when unit to be transfused. Unstageable sacral ulcer with Home Health to continue to follow with dressing changes. Palliative care social worker did  speak with Joshua Dougherty about follow-up appointments to coordinate care in emotional support. He does live with his mother and has support from his sister and his daughter's mother . He has a 49 year old daughter.  He does receive Home Health from [facility] in [place] continues to visit three times a week for Wound Care. Physical therapy is planning to visit two times a week. Medicaid is pending. He does need a primary provider. Handicap sticker form was completed by palliative provider. He did have a telemedicine infectious disease visit 7 / 2 / 2020 with a history of AIDS, latent TB (treated many years ago in  [place]), recent mssa bacteremia. He is on [employer]. Fatigue has  improved. Appetite continues to be poor. Last viral load was 1.4 million copies and CD4 was 45 from 06/2018. He is now being followed at the [place] Alwyn Pea NP.  He was seen in the ED department visit 8 / 14 / 2020 for possible DVT left leg he was at an outpatient ultrasound facility at [facility] which diagnose DVT today. He has a history of recent hospitalization after being sent from the hospice home for admission for sepsis from an infected hemodialysis catheter and discharge with a PICC line, also right lower lobe pneumonia, hyponatremia, hyperkalemia, acute kidney injury in the setting of AIDS. Workup was significant for acute obstruction of the left common femoral vein with evidence of chronic obstruction in left popliteal, started on Eliquis and follow up with vascular surgery. Labs were unremarkable.  I called [name] for schedule palliative care visit buy telemedicine. [name] in agreement. We talked about how he was feeling today. He replies that he was doing much better. We talked about recent visit to the emergency department for a blood clot for what she does continue to take anticoagulation. Eliquis was too expensive so he was switched to. We talked about symptoms of pain when she denies. He denies shortness of breath. He talked about his appetite which actually has improved. Joshua Dougherty endorses he is feeling better about his overall health. We talked about his conversation with palliative care social worker Margaretmary Lombard. We talked about his telemedicine visit with infectious disease although they wanted to see him in person he was in the emergency department at that time. We talked about his scheduled appointment with [facility] for Primary Care which is currently pending he is waiting on a phone call to schedule. We talked about his appointment with vascular surgery next week. We talked about medical goals with his wishes to continue  aggressive interventions. We talked about handicap form which was completed in he did receive, obtaining his handicap sticker without any problems. We talked about role of palliative care and plan of care. Mr Allegra an agreement to continue palliative care following with next schedule visit in 4 weeks if needed or sooner should he declined, appointment scheduled. Praised Mr Commings for compliance, self-care and positive attitude. Therapeutic listening and emotional support provided. Questions answered to satisfaction. Contact information provided. Palliative Care was asked to help to continue to address goals of care.   CODE STATUS: Full code  PPS: 40% HOSPICE ELIGIBILITY/DIAGNOSIS: TBD  PAST MEDICAL HISTORY:  Past Medical History:  Diagnosis Date  . HIV (human immunodeficiency virus infection) ([place])   . Renal disorder   . Tuberculosis     SOCIAL HX:  Social History   Tobacco Use  . Smoking status: Former Smoker    Packs/day: 0.25    Years: 20.00    Pack years: 5.00  . Smokeless tobacco: Never Used  . Tobacco comment: per pt has not smoked in last 30 days  Substance Use Topics  . Alcohol use: Yes    Alcohol/week: 2.0 - 3.0 standard drinks    Types: 2 - 3 Standard drinks or equivalent per week    Frequency: Never    ALLERGIES: No Known Allergies   PERTINENT MEDICATIONS:  Outpatient Encounter Medications as of 09/09/2018  Medication Sig  . apixaban (ELIQUIS) 5 MG TABS tablet Take 2 tablets (10 mg total) by mouth 2 (two) times daily. Take  2 tablets (10mg )by mouth daily for 7 days followed by 5 mg twice daily.  . bictegravir-emtricitabine-tenofovir AF (BIKTARVY) 50-200-25 MG TABS tablet Take 1 tablet by mouth daily.  . ferrous sulfate 325 (65 FE) MG tablet Take 325 mg by mouth daily with breakfast.  . megestrol (MEGACE) 400 MG/10ML suspension Take 10 mLs (400 mg total) by mouth 2 (two) times daily. (Patient not taking: Reported on 09/05/2018)  . Multiple Vitamins-Minerals  (MULTIVITAMIN WITH MINERALS) tablet Take 1 tablet by mouth daily.  Marland Kitchen sulfamethoxazole-trimethoprim (BACTRIM) 400-80 MG tablet Take 1 tablet by mouth 3 (three) times a week. (Patient not taking: Reported on 09/05/2018)   No facility-administered encounter medications on file as of 09/09/2018.     PHYSICAL EXAM:   Deferred  Kamren Heintzelman Z Cozy Veale, NP

## 2018-09-10 ENCOUNTER — Other Ambulatory Visit: Payer: Self-pay

## 2018-09-12 LAB — MISC LABCORP TEST (SEND OUT): Labcorp test code: 183753

## 2018-09-16 ENCOUNTER — Other Ambulatory Visit: Payer: Self-pay | Admitting: Infectious Diseases

## 2018-10-06 ENCOUNTER — Encounter (INDEPENDENT_AMBULATORY_CARE_PROVIDER_SITE_OTHER): Payer: Self-pay | Admitting: Vascular Surgery

## 2018-10-06 ENCOUNTER — Other Ambulatory Visit: Payer: Self-pay

## 2018-10-06 ENCOUNTER — Ambulatory Visit (INDEPENDENT_AMBULATORY_CARE_PROVIDER_SITE_OTHER): Payer: Self-pay | Admitting: Vascular Surgery

## 2018-10-06 ENCOUNTER — Ambulatory Visit (INDEPENDENT_AMBULATORY_CARE_PROVIDER_SITE_OTHER): Payer: Self-pay

## 2018-10-06 ENCOUNTER — Other Ambulatory Visit (INDEPENDENT_AMBULATORY_CARE_PROVIDER_SITE_OTHER): Payer: Self-pay | Admitting: Vascular Surgery

## 2018-10-06 VITALS — BP 157/93 | HR 60 | Resp 12 | Ht 76.0 in | Wt 154.0 lb

## 2018-10-06 DIAGNOSIS — I82413 Acute embolism and thrombosis of femoral vein, bilateral: Secondary | ICD-10-CM

## 2018-10-06 DIAGNOSIS — B20 Human immunodeficiency virus [HIV] disease: Secondary | ICD-10-CM

## 2018-10-06 DIAGNOSIS — Z86718 Personal history of other venous thrombosis and embolism: Secondary | ICD-10-CM

## 2018-10-06 DIAGNOSIS — I82409 Acute embolism and thrombosis of unspecified deep veins of unspecified lower extremity: Secondary | ICD-10-CM | POA: Insufficient documentation

## 2018-10-06 DIAGNOSIS — N08 Glomerular disorders in diseases classified elsewhere: Secondary | ICD-10-CM

## 2018-10-06 NOTE — Progress Notes (Signed)
MRN : [id number]  Joshua Dougherty is a 48 y.o. (Mar 14, 1970) male who presents with chief complaint of  Chief Complaint  Patient presents with  . New Patient (Initial Visit)  .  History of Present Illness:   The patient presents to the office for evaluation of DVT.  DVT was identified at [facility] by Duplex ultrasound.  The initial symptoms were pain and swelling in the lower extremities.  In late June the patient developed severe pneumonia in association with his HIV and was hospitalized for approximately 1 month.  At one point he was placed on hospice care but subsequently had a significant improvement and was brought back to the hospital and successfully treated for his infection.  His pneumonia has now resolved his kidney function has returned and his viral level is undetectable.  However during this time he did notice increased swelling of his legs particularly his left and a marked increase in pain and it was at this point that the ultrasound was obtained.  The patient notes the leg continues to be very painful with dependency and swells quite a bite.  Symptoms are much better with elevation.  The patient notes minimal edema in the morning which steadily worsens throughout the day.    The patient has not been using compression therapy at this point.  No SOB or pleuritic chest pains.  No cough or hemoptysis.  No blood per rectum or blood in any sputum.  No excessive bruising per the patient.   Current Meds  Medication Sig  . bictegravir-emtricitabine-tenofovir AF (BIKTARVY) 50-200-25 MG TABS tablet Take 1 tablet by mouth daily.  . ferrous sulfate 325 (65 FE) MG tablet Take 325 mg by mouth daily with breakfast.  . Multiple Vitamins-Minerals (MULTIVITAMIN WITH MINERALS) tablet Take 1 tablet by mouth daily.  Alveda Reasons 15 MG TABS tablet TAKE 1 TABLET BY MOUTH TWICE DAILY WITH FOOD FOR 21 DAYS    Past Medical History:  Diagnosis Date  . HIV (human immunodeficiency virus infection) ([place])   .  Renal disorder   . Tuberculosis     Past Surgical History:  Procedure Laterality Date  . ABSCESS DRAINAGE  2013   chest wall; patient states it was not contiguous with his lung however this was an operation under general anesthesia  . FLEXIBLE BRONCHOSCOPY Right 03/28/2018   Procedure: FLEXIBLE BRONCHOSCOPY, RIGHT;  Surgeon: Laverle Hobby, MD;  Location: [facility] ORS;  Service: Pulmonary;  Laterality: Right;  . TEE WITHOUT CARDIOVERSION N/A 08/01/2018   Procedure: TRANSESOPHAGEAL ECHOCARDIOGRAM (TEE);  Surgeon: Teodoro Spray, MD;  Location: [facility] ORS;  Service: Cardiovascular;  Laterality: N/A;  . TEE WITHOUT CARDIOVERSION N/A 08/04/2018   Procedure: TRANSESOPHAGEAL ECHOCARDIOGRAM (TEE);  Surgeon: Teodoro Spray, MD;  Location: [facility] ORS;  Service: Cardiovascular;  Laterality: N/A;    Social History Social History   Tobacco Use  . Smoking status: Former Smoker    Packs/day: 0.25    Years: 20.00    Pack years: 5.00  . Smokeless tobacco: Never Used  . Tobacco comment: per pt has not smoked in last 30 days  Substance Use Topics  . Alcohol use: Yes    Alcohol/week: 2.0 - 3.0 standard drinks    Types: 2 - 3 Standard drinks or equivalent per week    Frequency: Never  . Drug use: No    Family History No family history on file.  No family history of bleeding/clotting disorders, porphyria or autoimmune disease   No Known Allergies   REVIEW  OF SYSTEMS (Negative unless checked)  Constitutional: _0 Weight loss  _1 Fever  _2 Chills Cardiac: _3 Chest pain   _4 Chest pressure   _5 Palpitations   _6 Shortness of breath when laying flat   _7 Shortness of breath with exertion. Vascular:  _8 Pain in legs with walking   _9 Pain in legs at rest  _10 History of DVT   _11 Phlebitis   _12 Swelling in legs   _13 Varicose veins   _14 Non-healing ulcers Pulmonary:   _15 Uses home oxygen   _16 Productive cough   _17 Hemoptysis   _18 Wheeze  _19 COPD   _20 Asthma Neurologic:  _21 Dizziness   _22 Seizures   _23 History of stroke    _24 History of TIA  _25 Aphasia   _26 Vissual changes   _27 Weakness or numbness in arm   _28 Weakness or numbness in leg Musculoskeletal:   _29 Joint swelling   _30 Joint pain   _31 Low back pain Hematologic:  _32 Easy bruising  _33 Easy bleeding   _34 Hypercoagulable state   _35 Anemic Gastrointestinal:  _36 Diarrhea   _37 Vomiting  _38 Gastroesophageal reflux/heartburn   _39 Difficulty swallowing. Genitourinary:  _40 Chronic kidney disease   _41 Difficult urination  _42 Frequent urination   _43 Blood in urine Skin:  _44 Rashes   _45 Ulcers  Psychological:  _46 History of anxiety   _47  History of major depression.  Physical Examination  Vitals:   10/06/18 1522  BP: (!) 157/93  Pulse: 60  Resp: 12  Weight: 154 lb (69.9 kg)  Height: _48  (1.93 m)   Body mass index is 18.75 kg/m. Gen: WD/WN, NAD Head: [place]/AT, No temporalis wasting.  Ear/Nose/Throat: Hearing grossly intact, nares w/o erythema or drainage, poor dentition Eyes: PER, EOMI, sclera nonicteric.  Neck: Supple, no masses.  No bruit or JVD.  Pulmonary:  Good air movement, clear to auscultation bilaterally, no use of accessory muscles.  Cardiac: RRR, normal S1, S2, no Murmurs. Vascular: scattered varicosities present bilaterally.  Moderate venous stasis changes to the legs bilaterally.  2-3+ hard nonpitting edema left >> right Gastrointestinal: soft, non-distended. No guarding/no peritoneal signs.  Musculoskeletal: M/S 5/5 throughout.  moderate deformity and atrophy of the legs.  Neurologic: CN 2-12 intact. Pain and light touch intact in extremities.  Symmetrical.  Speech is fluent. Motor exam as listed above. Psychiatric: Judgment intact, Mood & affect appropriate for pt's clinical situation. Dermatologic: No rashes or ulcers noted.  No changes consistent with cellulitis. Lymph : No Cervical lymphadenopathy, no lichenification or skin changes of chronic lymphedema.  CBC Lab Results  Component Value Date   WBC 4.4 09/05/2018   HGB 9.4 (L) 09/05/2018   HCT 28.6 (L)  09/05/2018   MCV 100.4 (H) 09/05/2018   PLT 298 09/05/2018    BMET    Component Value Date/Time   NA 137 09/05/2018 1014   K 3.9 09/05/2018 1014   CL 105 09/05/2018 1014   CO2 26 09/05/2018 1014   GLUCOSE 140 (H) 09/05/2018 1014   BUN 12 09/05/2018 1014   CREATININE 0.75 09/05/2018 1014   CALCIUM 8.3 (L) 09/05/2018 1014   GFRNONAA >60 09/05/2018 1014   GFRAA >60 09/05/2018 1014   CrCl cannot be calculated (Patient's most recent lab result is older than the maximum 21 days allowed.).  COAG No results found for: INR, PROTIME  Radiology No results found.   Assessment/Plan 1. Acute deep vein thrombosis (DVT) of femoral vein of both lower extremities (HCC) Recommend:   No surgery or intervention at this point in time.  IVC filter is not indicated at present.  Patient's duplex ultrasound of the venous system shows chronic changes in the  popliteal and femoral  veins.  The patient is initiated on anticoagulation and January will be 6 months of therapy.  Elevation was stressed, use of a recliner was discussed.  I have had a long discussion with the patient regarding DVT and post phlebitic changes such as swelling and why it  causes symptoms such as pain.  The patient will wear graduated compression stockings, beginning after three full days of anticoagulation, on a daily basis a prescription was given. The patient will  beginning wearing the stockings first thing in the morning and removing them in the evening. The patient is instructed specifically not to sleep in the stockings.  In addition, behavioral modification including elevation during the day and avoidance of prolonged dependency will be initiated.    The patient will continue anticoagulation for now as there have not been any problems or complications at this point.   - VAS [place] LOWER EXTREMITY VENOUS (DVT); Future  2. 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

## 2018-10-16 ENCOUNTER — Other Ambulatory Visit: Payer: Self-pay

## 2018-10-16 ENCOUNTER — Other Ambulatory Visit: Payer: Self-pay | Admitting: Nurse Practitioner

## 2018-10-16 ENCOUNTER — Encounter: Payer: Self-pay | Admitting: Nurse Practitioner

## 2018-10-16 DIAGNOSIS — Z515 Encounter for palliative care: Secondary | ICD-10-CM

## 2018-10-16 NOTE — Progress Notes (Signed)
[employer] Palliative Care Consult Note Telephone: [phone]  Fax: [phone]  PATIENT NAME: Joshua Dougherty DOB: 01-09-1971 MRN: [id number]  PRIMARY CARE PROVIDER:   [facility] REFERRING PROVIDER:  [facility] RESPONSIBLE PARTY:    Self  Due to the COVID-19 crisis, this visit was done via telemedicine from my office and it was initiated and consent by this patient and or family.  RECOMMENDATIONS and PLAN: 1.ACP; Full code with aggressive interventions and treatment options  2.Generalized weaknessR53.1 secondary to deconditioningcontinue with therapy as able. Encourage energy conservation and rest times.  3.Anorexia R63.0 appetite improving. Continue to encourage supplements and comfort feedings. Discuss nutrition  I spent 40 minutes providing this consultation,  from 1:00pm to 1:40pm. More than 50% of the time in this consultation was spent coordinating communication.   HISTORY OF PRESENT ILLNESS:  Joshua Dougherty is a 48 y.o. year old male with multiple medical problems including HIV/AIDS, TB, chronic kidney disease previously on hemodialysis. He was recently seen by Dr Delana [facility] to continue anticoagulation for PVP, no surgical intervention needed at this time or filter. Recommendation to continue compression wraps. I called Joshua Dougherty for scheduled telemedicine Flash telephonics palliative care follow-up visit. Joshua Dougherty agreement. We talked about how he was feeling and he said that he's doing better. We talked about symptoms of pain, neuropathy. We talked about his appointment at primary provider [facility]. We talked about chronic disease progression of neuropathy. We talked about keeping a pain Journal as he has an upcoming appointment with primary provider at [facility] to review. We talked about different medicines that are options for neuropathy. We talked about functional level as he  is ambulatory now so limited due to pain in his feet. He does have to take rest times in between. He is able to perform adl's. We talked about his appetite which has significantly improved. He has been able to eat on a regular basis and has gained 25 lbs lbs with his weight 150 lbs as of now. We talked about his last appointment with infectious disease and lab showing HIV remission. We talked about quality of life, medical goals. We talked about roll a palliative care plan of care. Discuss will follow up in 4 weeks if needed or sooner should he declined. He has significantly improved since hospitalization. Therapeutic listening and emotional support provided. Appointment schedule. Mr Joshua Dougherty in agreement. Questions answered satisfaction. Contact information.  Palliative Care was asked to help to continue to address goals of care.   CODE STATUS: Full code  PPS: 50% HOSPICE ELIGIBILITY/DIAGNOSIS: TBD  PAST MEDICAL HISTORY:  Past Medical History:  Diagnosis Date  . HIV (human immunodeficiency virus infection) ([place])   . Renal disorder   . Tuberculosis     SOCIAL HX:  Social History   Tobacco Use  . Smoking status: Former Smoker    Packs/day: 0.25    Years: 20.00    Pack years: 5.00  . Smokeless tobacco: Never Used  . Tobacco comment: per pt has not smoked in last 30 days  Substance Use Topics  . Alcohol use: Yes    Alcohol/week: 2.0 - 3.0 standard drinks    Types: 2 - 3 Standard drinks or equivalent per week    Frequency: Never    ALLERGIES: No Known Allergies   PERTINENT MEDICATIONS:  Outpatient Encounter Medications as of 10/16/2018  Medication Sig  . bictegravir-emtricitabine-tenofovir AF (BIKTARVY) 50-200-25 MG TABS tablet Take 1 tablet by  mouth daily.  . ferrous sulfate 325 (65 FE) MG tablet Take 325 mg by mouth daily with breakfast.  . Multiple Vitamins-Minerals (MULTIVITAMIN WITH MINERALS) tablet Take 1 tablet by mouth daily.  Alveda Reasons 15 MG TABS tablet TAKE 1 TABLET BY  MOUTH TWICE DAILY WITH FOOD FOR 21 DAYS   No facility-administered encounter medications on file as of 10/16/2018.     PHYSICAL EXAM:   Deferred  Z , NP

## 2018-11-11 ENCOUNTER — Other Ambulatory Visit: Payer: Self-pay

## 2018-11-11 ENCOUNTER — Other Ambulatory Visit: Payer: Self-pay | Admitting: Nurse Practitioner

## 2018-11-11 ENCOUNTER — Telehealth: Payer: Self-pay | Admitting: Nurse Practitioner

## 2018-11-11 NOTE — Telephone Encounter (Signed)
I called [name] for scheduled telemedicine, telephone palliative care visit, no answer, message left to call back with contact information.

## 2018-12-05 ENCOUNTER — Telehealth: Payer: Self-pay | Admitting: Nurse Practitioner

## 2018-12-05 NOTE — Telephone Encounter (Signed)
Spoke with patient regarding rescheduling the Palliative f/u visit for 11/11/18 - this was rescheduled for 12/25/18 @ 12 Noon.

## 2018-12-25 ENCOUNTER — Other Ambulatory Visit: Payer: Self-pay | Admitting: Nurse Practitioner

## 2018-12-25 ENCOUNTER — Other Ambulatory Visit: Payer: Self-pay

## 2018-12-25 ENCOUNTER — Telehealth: Payer: Self-pay | Admitting: Nurse Practitioner

## 2018-12-25 NOTE — Telephone Encounter (Signed)
I called Joshua Dougherty for scheduled PC telemedicine telephonic f/u visit, no answer, message left with contact information

## 2019-01-06 ENCOUNTER — Telehealth: Payer: Self-pay | Admitting: Nurse Practitioner

## 2019-01-06 NOTE — Telephone Encounter (Signed)
I called Joshua Dougherty, no answer and unable to leave a message to reschedule f/u PC visit. I also called his mother's phone and it is out of order

## 2019-01-06 NOTE — Telephone Encounter (Signed)
I called Mr. Kirvin, no answer and unable to leave a message to reschedule f/u PC visit. I also called his mother's phone and it is out of order

## 2019-01-14 ENCOUNTER — Telehealth: Payer: Self-pay | Admitting: Nurse Practitioner

## 2019-01-14 NOTE — Telephone Encounter (Signed)
I attempted to contact Joshua Dougherty for f/u PC visit, no answer, unable to leave a message

## 2019-02-09 ENCOUNTER — Encounter (INDEPENDENT_AMBULATORY_CARE_PROVIDER_SITE_OTHER): Payer: Medicaid Other

## 2019-02-09 ENCOUNTER — Ambulatory Visit (INDEPENDENT_AMBULATORY_CARE_PROVIDER_SITE_OTHER): Payer: Medicaid Other | Admitting: Vascular Surgery

## 2019-11-09 ENCOUNTER — Ambulatory Visit (INDEPENDENT_AMBULATORY_CARE_PROVIDER_SITE_OTHER): Payer: Medicaid Other | Admitting: Vascular Surgery

## 2019-11-09 ENCOUNTER — Other Ambulatory Visit: Payer: Self-pay

## 2019-11-09 ENCOUNTER — Encounter (INDEPENDENT_AMBULATORY_CARE_PROVIDER_SITE_OTHER): Payer: Self-pay | Admitting: Vascular Surgery

## 2019-11-09 VITALS — BP 150/94 | HR 66 | Resp 16 | Ht 76.0 in | Wt 172.0 lb

## 2019-11-09 DIAGNOSIS — I82413 Acute embolism and thrombosis of femoral vein, bilateral: Secondary | ICD-10-CM

## 2019-11-15 ENCOUNTER — Encounter (INDEPENDENT_AMBULATORY_CARE_PROVIDER_SITE_OTHER): Payer: Self-pay | Admitting: Vascular Surgery

## 2019-11-15 NOTE — Progress Notes (Signed)
MRN : [id number]  Joshua Dougherty is a 49 y.o. (1970/11/26) male who presents with chief complaint of  Chief Complaint  Patient presents with  . New Patient (Initial Visit)    ref Kothary hx of dvt edema  .  History of Present Illness:   The patient presents to the office for evaluation of DVT.  DVT was identified at [facility] by Duplex ultrasound.  The initial symptoms were pain and swelling in the lower extremity.  The patient notes the leg continues to be very painful with dependency and swells quite a bite.  Symptoms are much better with elevation.  The patient notes minimal edema in the morning which steadily worsens throughout the day.    The patient has not been using compression therapy at this point.  No SOB or pleuritic chest pains.  No cough or hemoptysis.  No blood per rectum or blood in any sputum.  No excessive bruising per the patient.   Current Meds  Medication Sig  . bictegravir-emtricitabine-tenofovir AF (BIKTARVY) 50-200-25 MG TABS tablet Take 1 tablet by mouth daily.    Past Medical History:  Diagnosis Date  . HIV (human immunodeficiency virus infection) ([place])   . Renal disorder   . Tuberculosis     Past Surgical History:  Procedure Laterality Date  . ABSCESS DRAINAGE  2013   chest wall; patient states it was not contiguous with his lung however this was an operation under general anesthesia  . FLEXIBLE BRONCHOSCOPY Right 03/28/2018   Procedure: FLEXIBLE BRONCHOSCOPY, RIGHT;  Surgeon: Laverle Hobby, MD;  Location: [facility] ORS;  Service: Pulmonary;  Laterality: Right;  . TEE WITHOUT CARDIOVERSION N/A 08/01/2018   Procedure: TRANSESOPHAGEAL ECHOCARDIOGRAM (TEE);  Surgeon: Teodoro Spray, MD;  Location: [facility] ORS;  Service: Cardiovascular;  Laterality: N/A;  . TEE WITHOUT CARDIOVERSION N/A 08/04/2018   Procedure: TRANSESOPHAGEAL ECHOCARDIOGRAM (TEE);  Surgeon: Teodoro Spray, MD;  Location: [facility] ORS;  Service: Cardiovascular;  Laterality: N/A;    Social  History Social History   Tobacco Use  . Smoking status: Former Smoker    Packs/day: 0.25    Years: 20.00    Pack years: 5.00  . Smokeless tobacco: Never Used  . Tobacco comment: per pt has not smoked in last 30 days  Vaping Use  . Vaping Use: Never used  Substance Use Topics  . Alcohol use: Yes    Alcohol/week: 2.0 - 3.0 standard drinks    Types: 2 - 3 Standard drinks or equivalent per week  . Drug use: No    Family History Family History  Problem Relation Age of Onset  . Hypertension Mother   . Cancer Father   No family history of bleeding/clotting disorders, porphyria or autoimmune disease   No Known Allergies   REVIEW OF SYSTEMS (Negative unless checked)  Constitutional: _0 Weight loss  _1 Fever  _2 Chills Cardiac: _3 Chest pain   _4 Chest pressure   _5 Palpitations   _6 Shortness of breath when laying flat   _7 Shortness of breath with exertion. Vascular:  _8 Pain in legs with walking   _9 Pain in legs at rest  _10 History of DVT   _11 Phlebitis   _12 Swelling in legs   _13 Varicose veins   _14 Non-healing ulcers Pulmonary:   _15 Uses home oxygen   _16 Productive cough   _17 Hemoptysis   _18 Wheeze  _19 COPD   _20 Asthma Neurologic:  _21 Dizziness   _22 Seizures   _23 History of stroke   _24 History of TIA  _25 Aphasia   _26 Vissual changes   _27 Weakness or numbness in arm   _28   Weakness or numbness in leg Musculoskeletal:   _0 Joint swelling   _1 Joint pain   _2 Low back pain Hematologic:  _3 Easy bruising  _4 Easy bleeding   _5 Hypercoagulable state   _6 Anemic Gastrointestinal:  _7 Diarrhea   _8 Vomiting  _9 Gastroesophageal reflux/heartburn   _10 Difficulty swallowing. Genitourinary:  _11 Chronic kidney disease   _12 Difficult urination  _13 Frequent urination   _14 Blood in urine Skin:  _15 Rashes   _16 Ulcers  Psychological:  _17 History of anxiety   _18  History of major depression.  Physical Examination  Vitals:   11/09/19 1439  BP: (!) 150/94  Pulse: 66  Resp: 16  Weight: 172 lb (78 kg)  Height: _19  (1.93 m)   Body mass  index is 20.94 kg/m. Gen: WD/WN, NAD Head: [place]/AT, No temporalis wasting.  Ear/Nose/Throat: Hearing grossly intact, nares w/o erythema or drainage, poor dentition Eyes: PER, EOMI, sclera nonicteric.  Neck: Supple, no masses.  No bruit or JVD.  Pulmonary:  Good air movement, clear to auscultation bilaterally, no use of accessory muscles.  Cardiac: RRR, normal S1, S2, no Murmurs. Vascular:  Lower extremities symmetric trace edema Vessel Right Left  Radial Palpable Palpable  Gastrointestinal: soft, non-distended. No guarding/no peritoneal signs.  Musculoskeletal: M/S 5/5 throughout.  No deformity or atrophy.  Neurologic: CN 2-12 intact. Pain and light touch intact in extremities.  Symmetrical.  Speech is fluent. Motor exam as listed above. Psychiatric: Judgment intact, Mood & affect appropriate for pt's clinical situation. Dermatologic: No rashes or ulcers noted.  No changes consistent with cellulitis. Lymph : No Cervical lymphadenopathy, no lichenification or skin changes of chronic lymphedema.  CBC Lab Results  Component Value Date   WBC 4.4 09/05/2018   HGB 9.4 (L) 09/05/2018   HCT 28.6 (L) 09/05/2018   MCV 100.4 (H) 09/05/2018   PLT 298 09/05/2018    BMET    Component Value Date/Time   NA 137 09/05/2018 1014   K 3.9 09/05/2018 1014   CL 105 09/05/2018 1014   CO2 26 09/05/2018 1014   GLUCOSE 140 (H) 09/05/2018 1014   BUN 12 09/05/2018 1014   CREATININE 0.75 09/05/2018 1014   CALCIUM 8.3 (L) 09/05/2018 1014   GFRNONAA >60 09/05/2018 1014   GFRAA >60 09/05/2018 1014   CrCl cannot be calculated (Patient's most recent lab result is older than the maximum 21 days allowed.).  COAG No results found for: INR, PROTIME  Radiology No results found.   Assessment/Plan 1. Acute deep vein thrombosis (DVT) of femoral vein of both lower extremities (HCC) Recommend:   No surgery or intervention at this point in time.  IVC filter is not indicated at present.  Patient's duplex  ultrasound of the venous system shows DVT from the popliteal to the femoral veins.  The patient is initiated on anticoagulation   Elevation was stressed, use of a recliner was discussed.  I have had a long discussion with the patient regarding DVT and post phlebitic changes such as swelling and why it  causes symptoms such as pain.  The patient will wear graduated compression stockings class 1 (20-30 mmHg), beginning after three full days of anticoagulation, on a daily basis a prescription was given. The patient will  beginning wearing the stockings first thing in the morning and removing them in the evening. The patient is instructed specifically not to sleep in the stockings.  In addition, behavioral modification including elevation during the day and avoidance of prolonged dependency will be initiated.    The patient will continue anticoagulation for now as there have  not been any problems or complications at this point.      Hortencia Pilar, MD  11/15/2019 12:45 PM

## 2020-02-01 ENCOUNTER — Telehealth (INDEPENDENT_AMBULATORY_CARE_PROVIDER_SITE_OTHER): Payer: Self-pay

## 2020-02-01 NOTE — Telephone Encounter (Signed)
Alwyn Pea NP with [facility] called seeing should the patient continue blood thinner if so he has no insurance and will need a couple options to go by. I spoke with Dr Delana Meyer and he recommended for the patient to come in for left le dvt and see himself to discuss anticouglation options. NP Pricilla Handler has been made aware with medical advice and the patient will called to schedule appointment.

## 2020-02-09 ENCOUNTER — Other Ambulatory Visit (INDEPENDENT_AMBULATORY_CARE_PROVIDER_SITE_OTHER): Payer: Self-pay | Admitting: Vascular Surgery

## 2020-02-09 DIAGNOSIS — Z86718 Personal history of other venous thrombosis and embolism: Secondary | ICD-10-CM

## 2020-02-11 ENCOUNTER — Encounter (INDEPENDENT_AMBULATORY_CARE_PROVIDER_SITE_OTHER): Payer: Self-pay

## 2020-02-11 ENCOUNTER — Ambulatory Visit (INDEPENDENT_AMBULATORY_CARE_PROVIDER_SITE_OTHER): Payer: Self-pay | Admitting: Nurse Practitioner

## 2020-02-25 ENCOUNTER — Ambulatory Visit (INDEPENDENT_AMBULATORY_CARE_PROVIDER_SITE_OTHER): Payer: Self-pay | Admitting: Nurse Practitioner

## 2020-02-25 ENCOUNTER — Encounter (INDEPENDENT_AMBULATORY_CARE_PROVIDER_SITE_OTHER): Payer: Self-pay

## 2020-03-06 IMAGING — DX PORTABLE CHEST - 1 VIEW
1 series · 1 of 1 positions shown · non-contrast
Comparison: 04/16/2018

CLINICAL DATA: Shortness of breath

EXAM:
PORTABLE CHEST 1 VIEW

[chest ap]
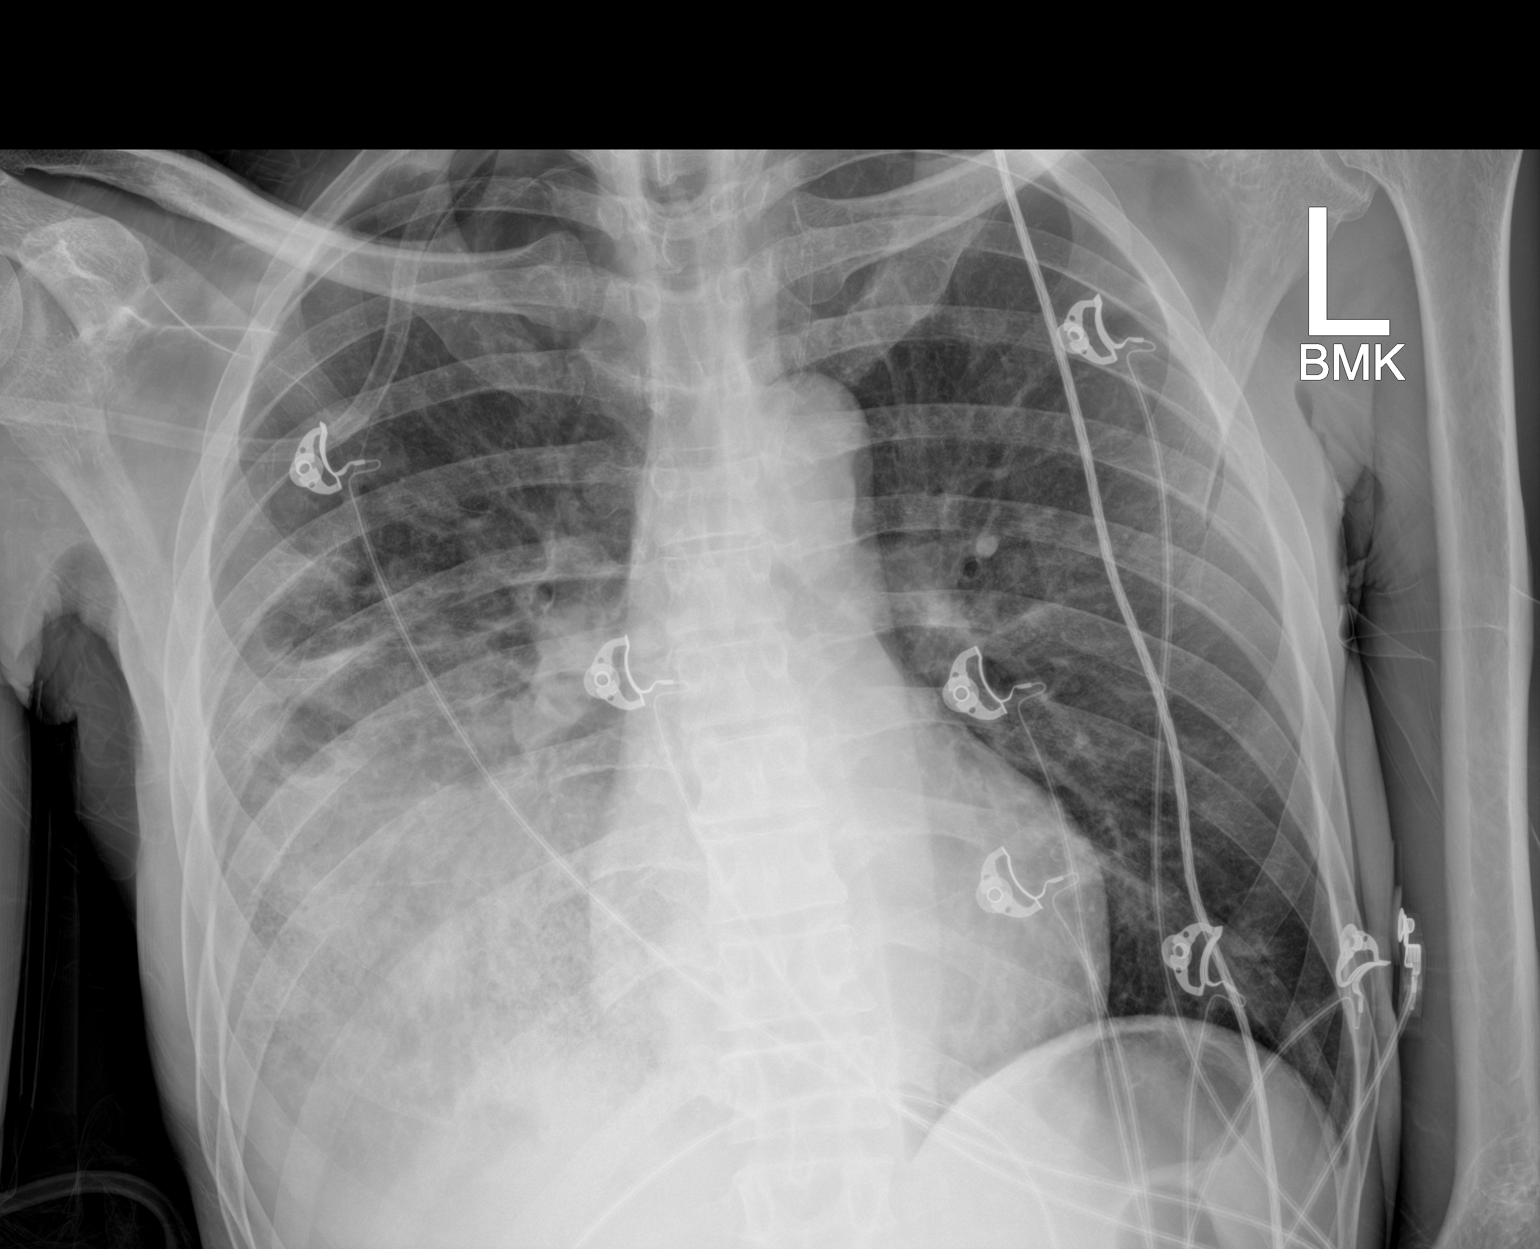

[1 of 1 positions shown; findings below may reference images not displayed]

FINDINGS: There is a large airspace opacity involving much of the right lower
lobe. There is likely a small parapneumonic effusion. There is a
density projecting over the upper trachea which is felt to represent
the patient's non-rebreather mass as opposed to a very high
endotracheal tube. Involving the left lower lobe, however this is
not well evaluated secondary to multiple cardiac leads. There is no
definite pneumothorax. There is a linear density coursing across the
patient's right mid chest which is felt to represent artifact as
opposed to a pneumothorax. There is no acute osseous abnormality.
IMPRESSION: 1. Large right lower lobe airspace opacity concerning for pneumonia.
2. Probable small right-sided parapneumonic effusion.
3. Additional small airspace opacity at the left lung base.
Attention on follow-up examinations is recommended.

## 2020-03-11 IMAGING — DX PORTABLE CHEST - 1 VIEW
1 series · 2 of 2 positions shown · non-contrast
Comparison: Chest radiograph 07/03/2018 and CT 07/04/2018

CLINICAL DATA: Respiratory failure. Altered mental status.

EXAM:
PORTABLE CHEST 1 VIEW

[Series 1: chest ap · 0.14mm/px · 2 of 2 slices shown]
[im 1/2]
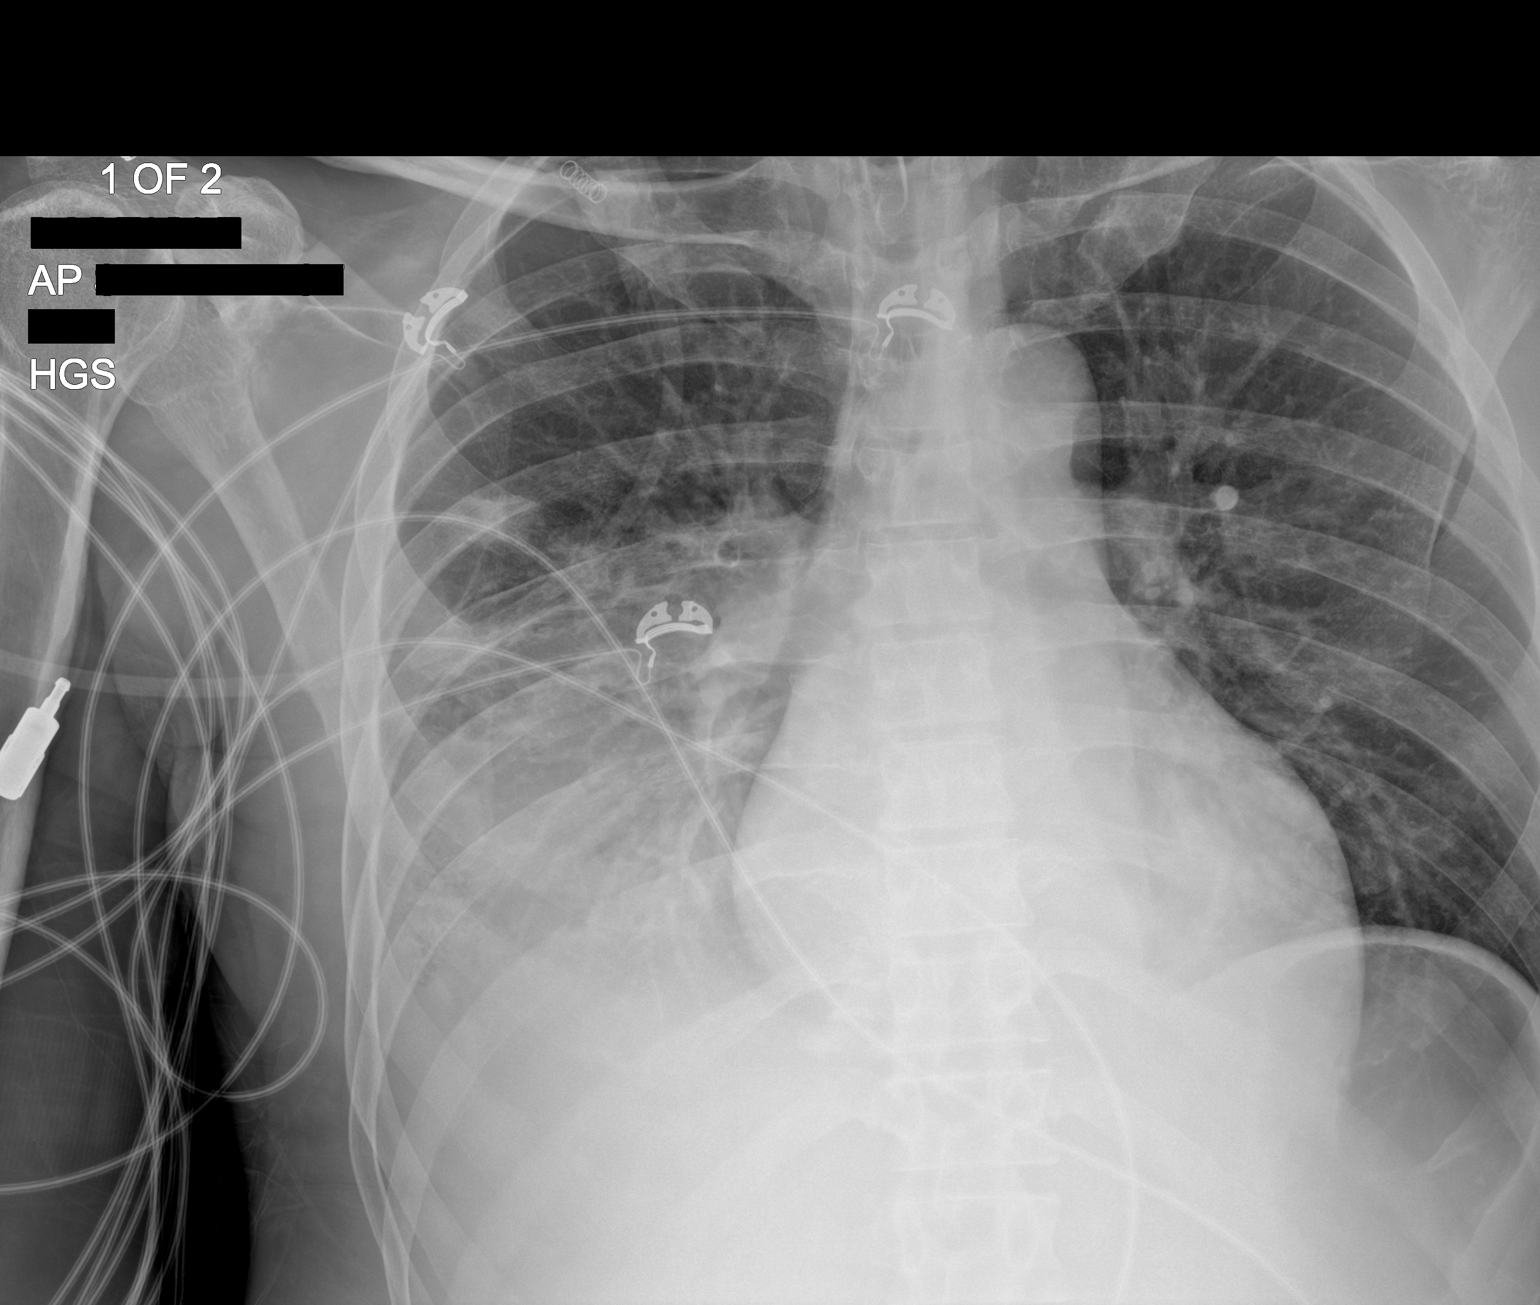
[im 2/2]
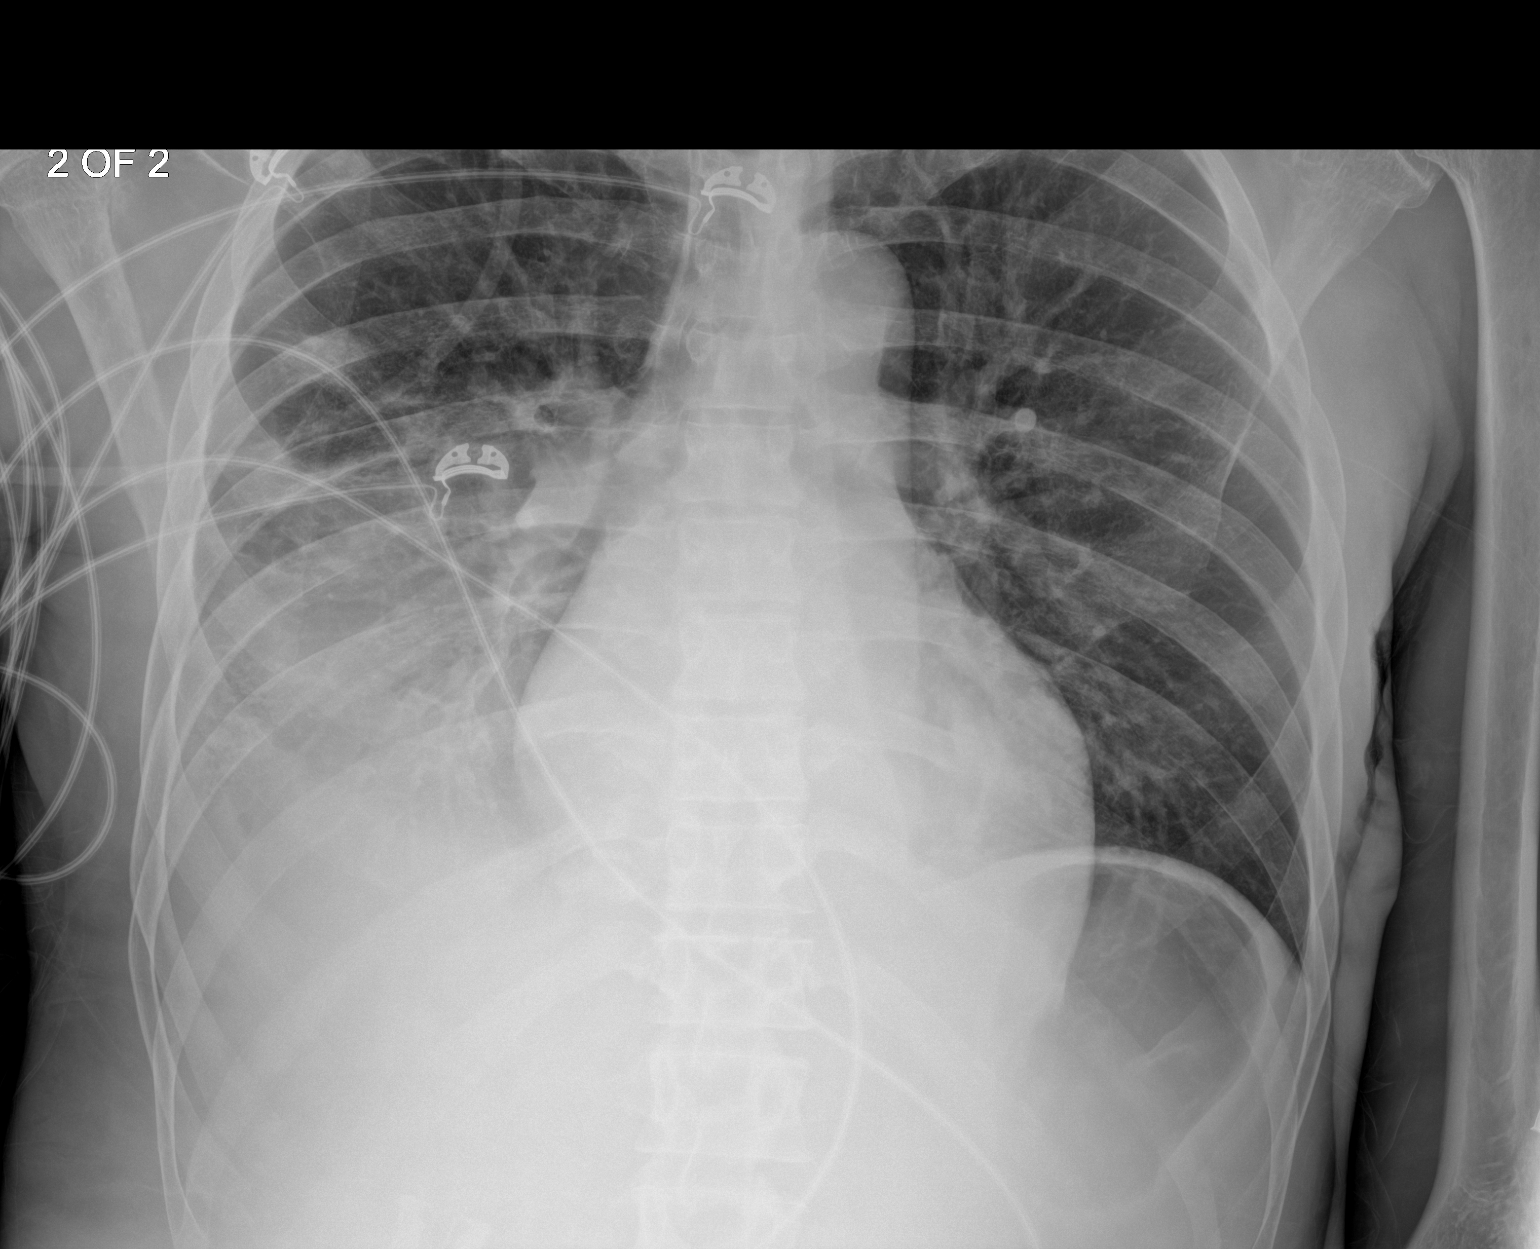

[2 of 2 positions shown; findings below may reference images not displayed]

FINDINGS: Endotracheal tube terminates at the clavicular heads. Enteric tube
has been removed since the prior radiographs were obtained. The
cardiomediastinal silhouette is unchanged. There is persistent
consolidation throughout the right lower lobe. They are is also
patchy right upper lobe and retrocardiac left lower lobe opacity as
seen on CT. No pneumothorax is identified.
IMPRESSION: Unchanged pneumonia with extensive right lower lobe involvement.

## 2020-03-11 IMAGING — US US RENAL
1 series · 14 of 25 positions shown · non-contrast
Comparison: None.

CLINICAL DATA: Acute renal failure

EXAM:
RENAL / URINARY TRACT ULTRASOUND COMPLETE

[Series 1: us renal · 14 of 32 slices shown]
[im 1/32]
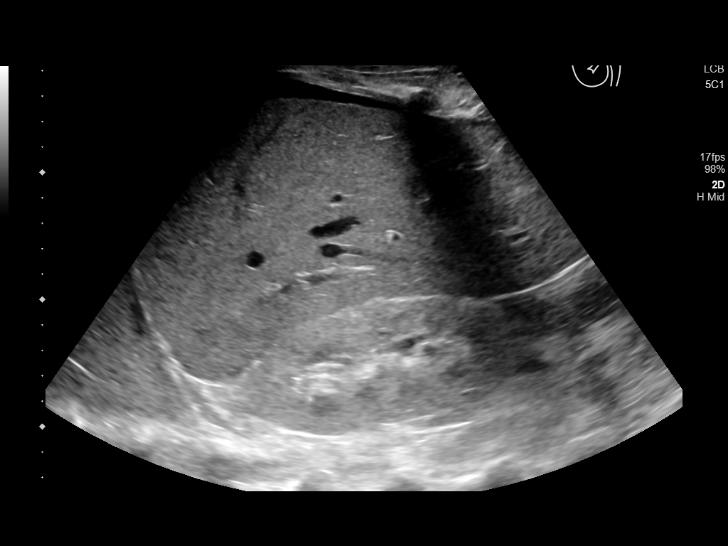
[im 3/32]
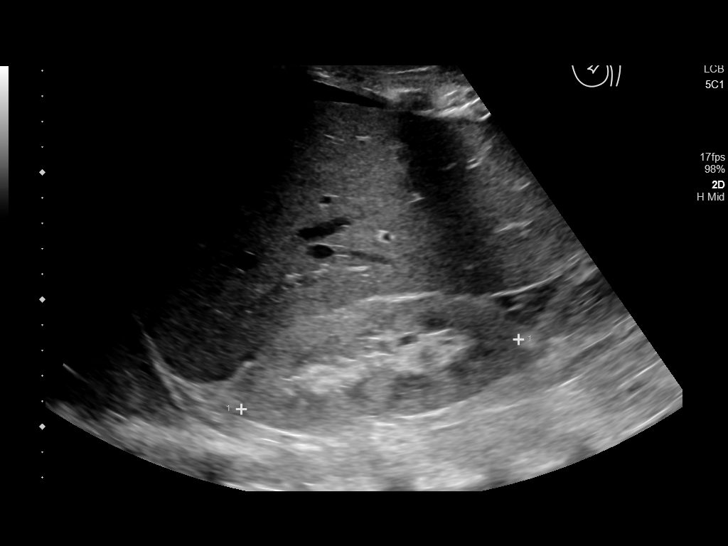
[im 6/32]
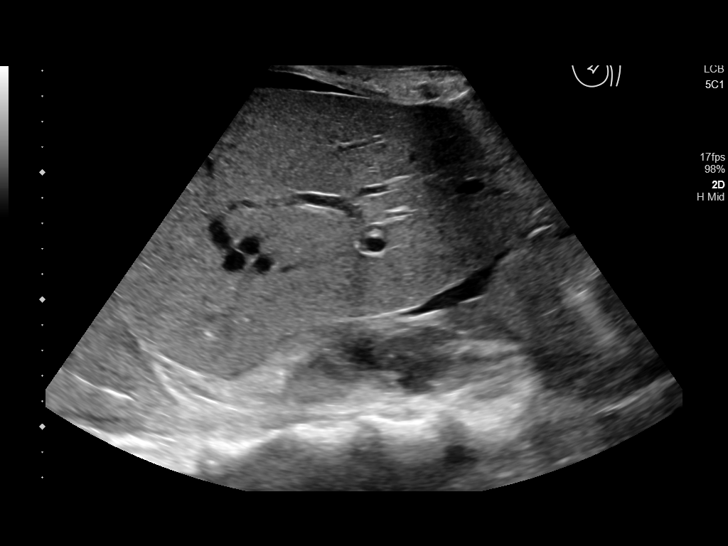
[im 8/32]
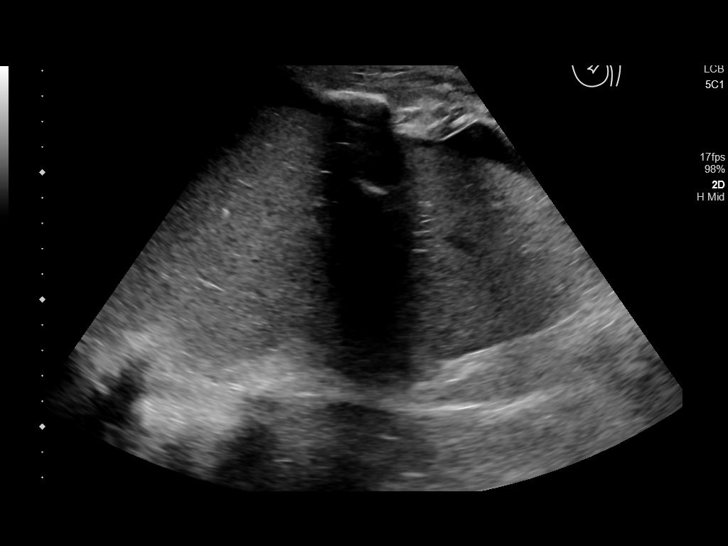
[im 11/32]
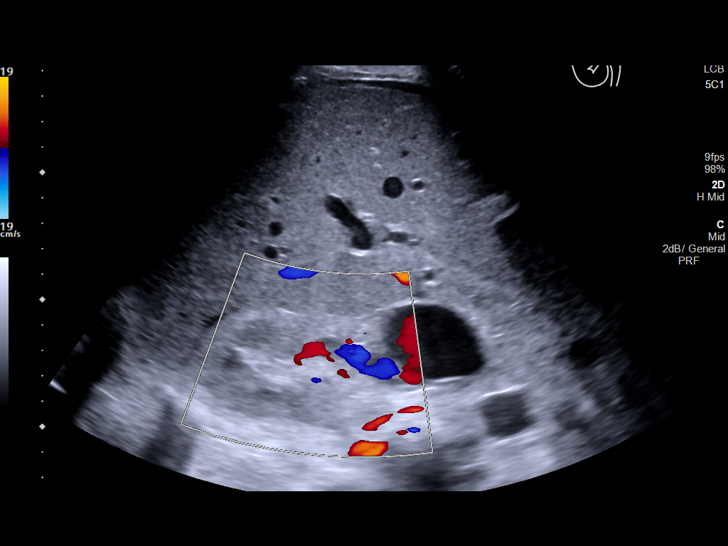
[im 12/32]
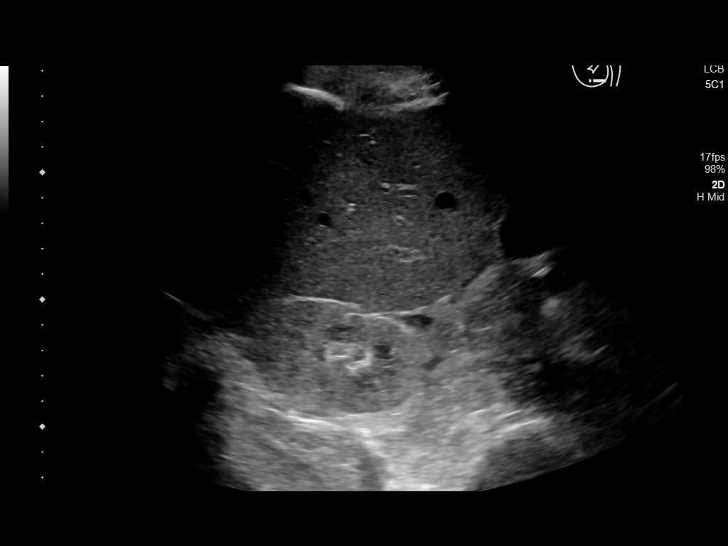
[im 15/32]
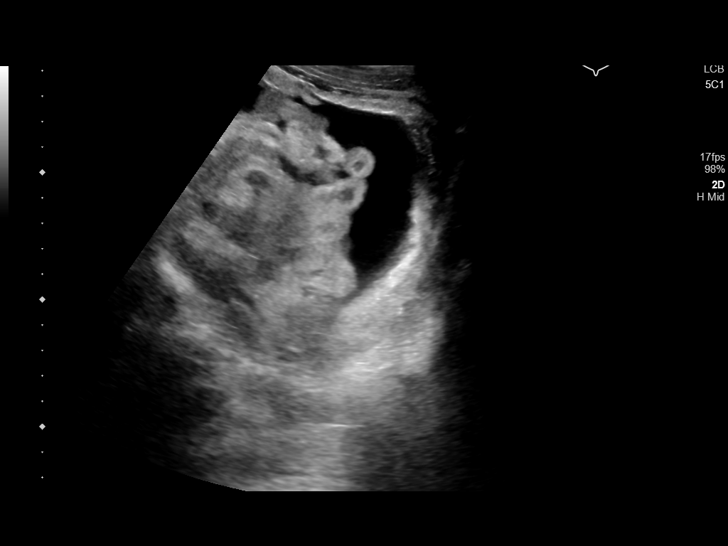
[im 17/32]
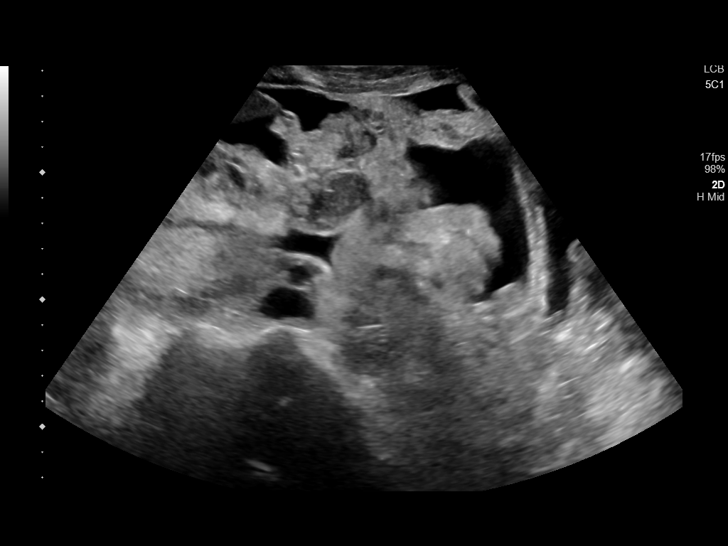
[im 20/32]
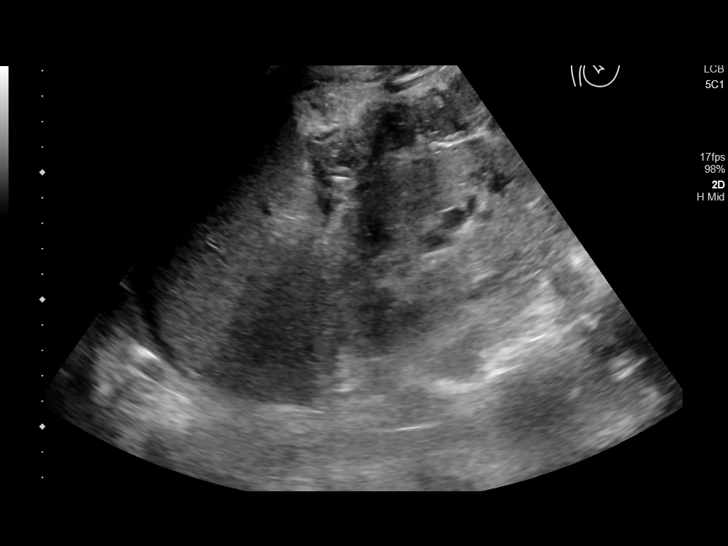
[im 21/32]
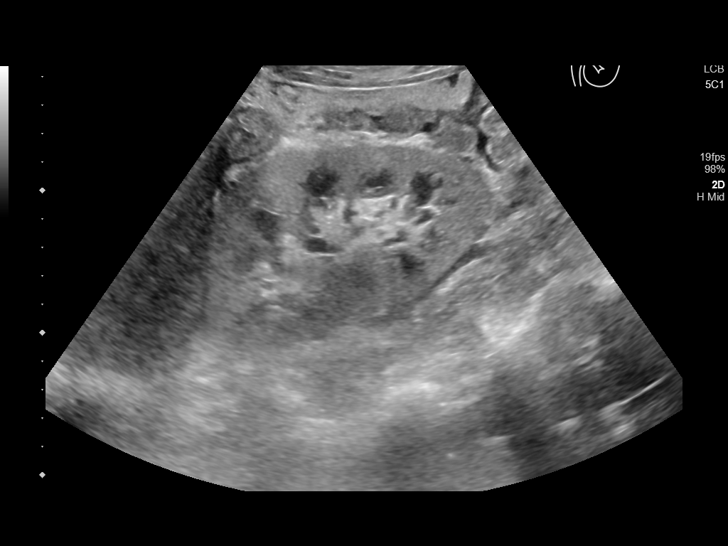
[im 24/32]
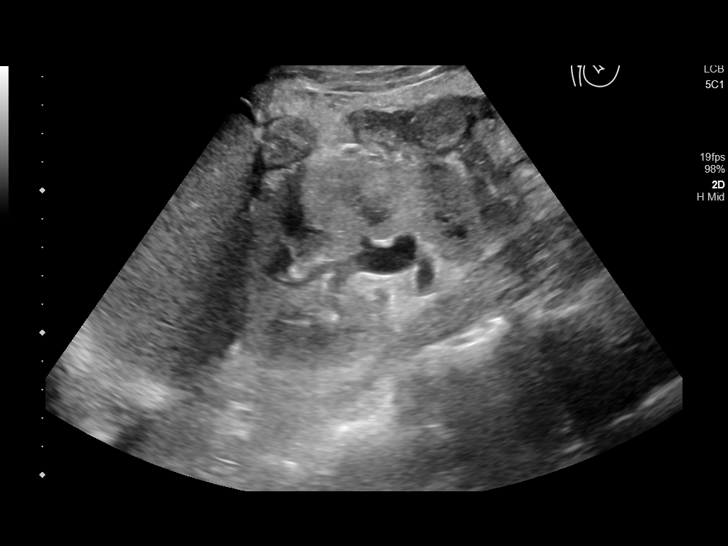
[im 26/32]
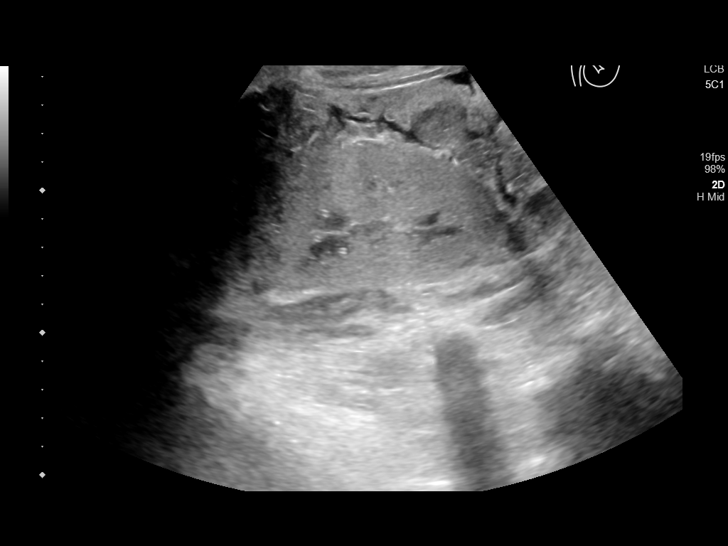
[im 29/32]
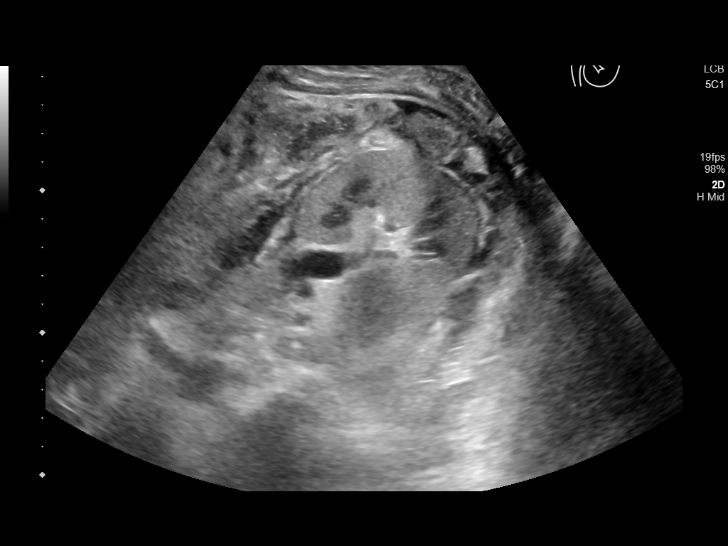
[im 32/32]
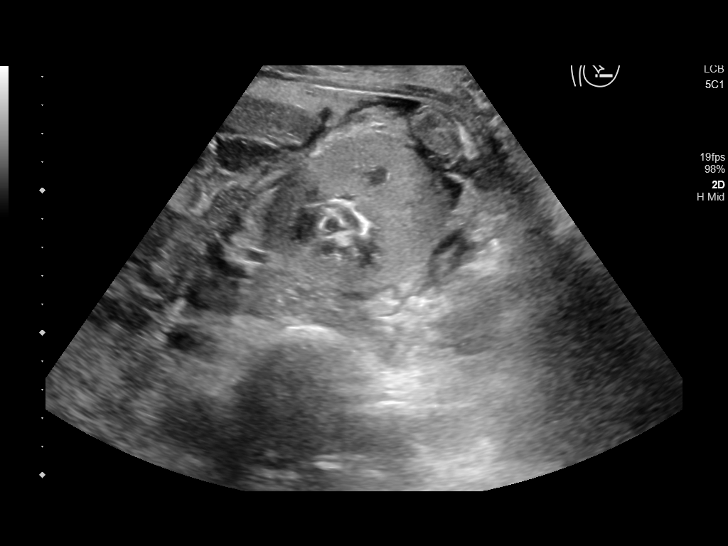

[14 of 25 positions shown; findings below may reference images not displayed]

FINDINGS: Right Kidney:

Renal measurements: 11.2 x 5.6 x 4.7 cm = volume: 154 mL. Increased
echogenicity compatible with medical renal disease. No
hydronephrosis or focal abnormality.

Left Kidney:

Renal measurements: 11.2 x 5.2 x 6.1 cm = volume: 187 mL. Similar
increased renal echogenicity compatible with medical renal disease.
No focal abnormality or hydronephrosis.

Bladder:

Appears normal for degree of bladder distention.

Small amount of diffuse abdominopelvic ascites noted.
IMPRESSION: Increased renal echogenicity bilaterally compatible with ongoing
medical renal disease. No hydronephrosis.

Small amount of abdominopelvic ascites

## 2020-04-06 IMAGING — CT CT CERVICAL SPINE WITHOUT CONTRAST
3 of 4 series · 11 of 33 positions shown, 13 images · non-contrast
Comparison: Chest CT 07/04/2018. Head CT without contrast
07/07/2018.

CLINICAL DATA: 48-year-old male with bacteremia.

EXAM:
CT CERVICAL, THORACIC, AND LUMBAR SPINE WITHOUT CONTRAST
TECHNIQUE: Multidetector CT imaging of the cervical, thoracic and lumbar spine
was performed without intravenous contrast. Multiplanar CT image
reconstructions were also generated.

[Series 4: sagittal bone · sagittal · 0.28mm/px · 5 of 48 slices shown, 6 images]
[im 16/48  bone]
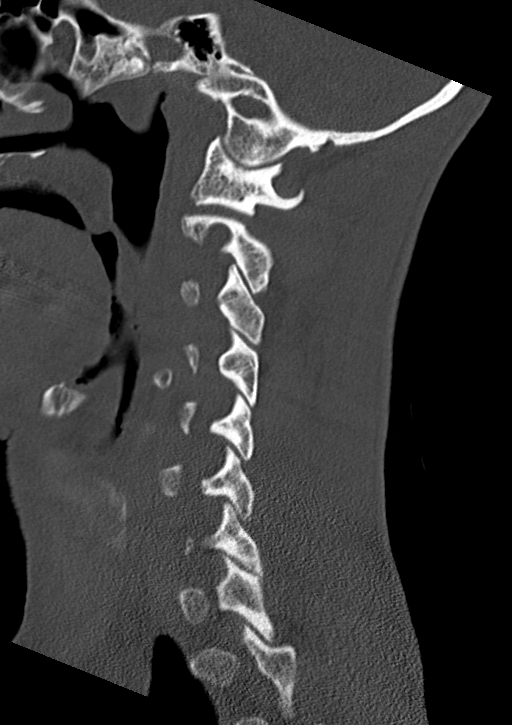
[im 20/48  bone]
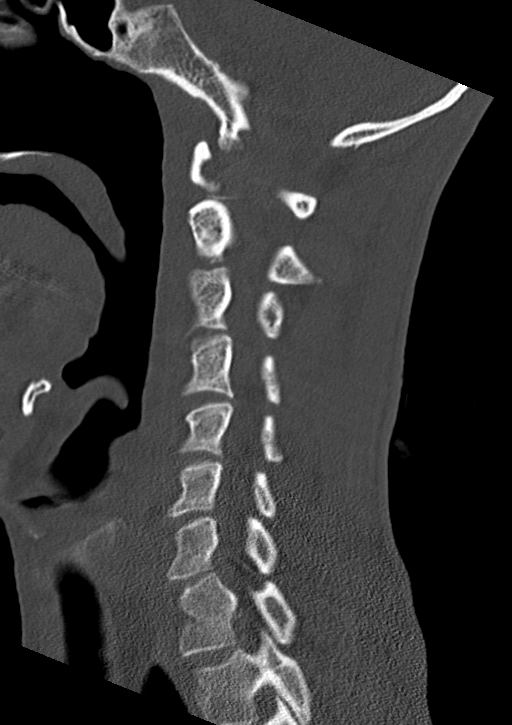
[im 24/48  soft-tissue]
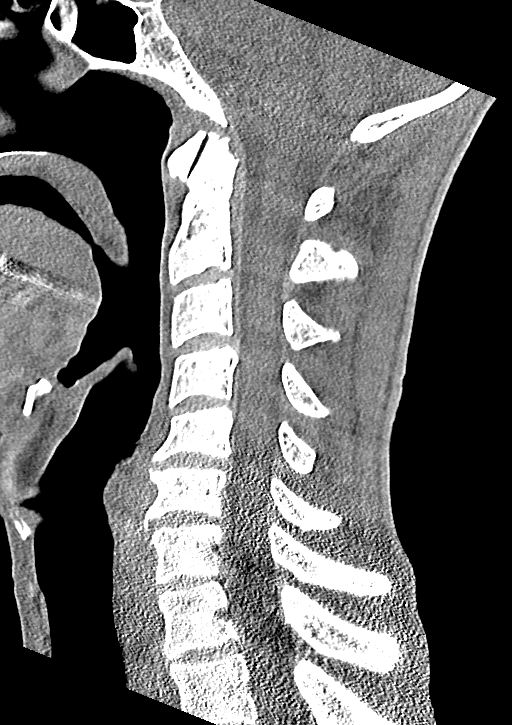
[im 24/48  bone]
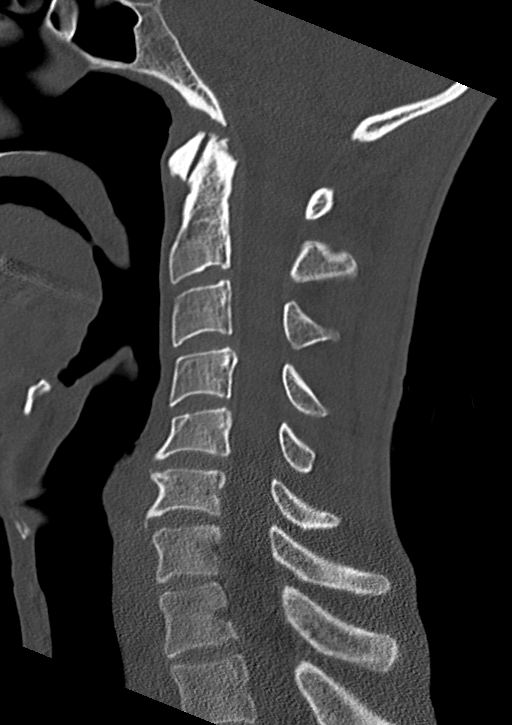
[im 28/48  bone]
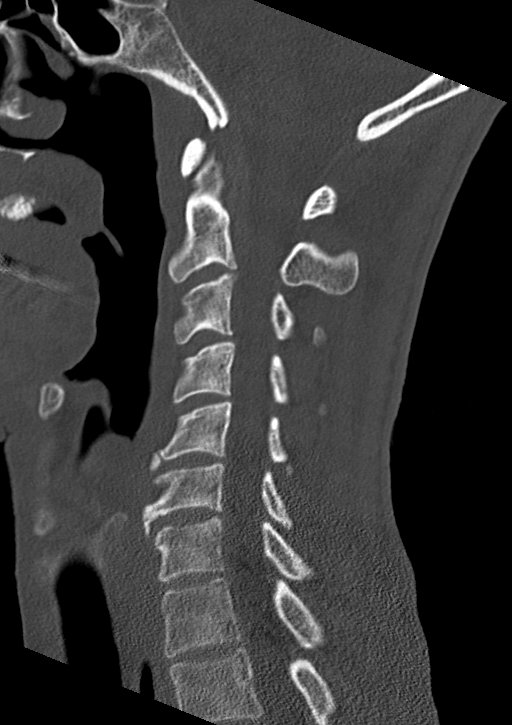
[im 32/48  bone]
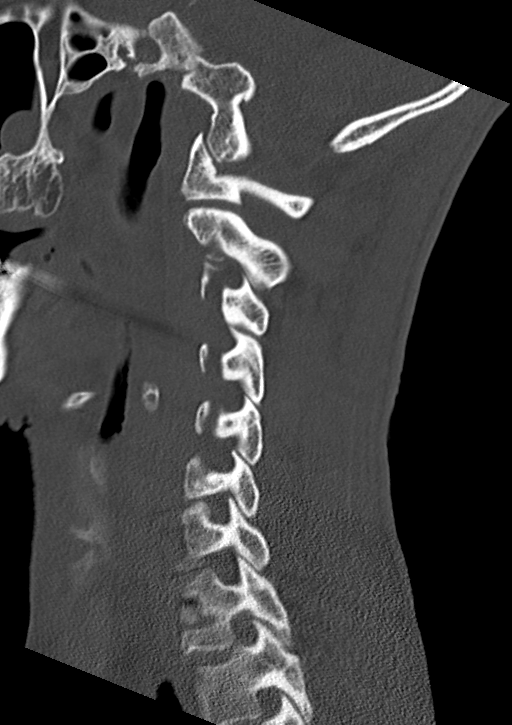

[Series 5: coronal bone · coronal · 0.21mm/px · 3 of 46 slices shown]
[im 10/46  bone]
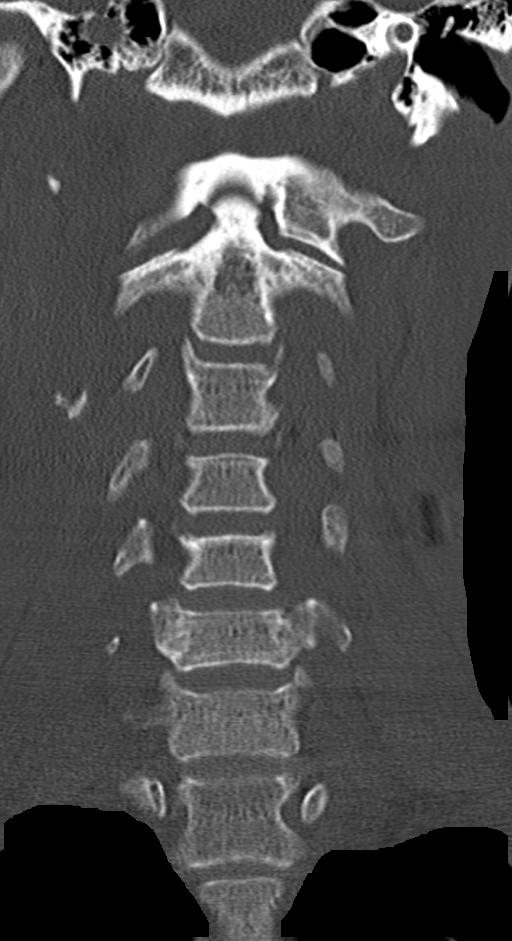
[im 19/46  bone]
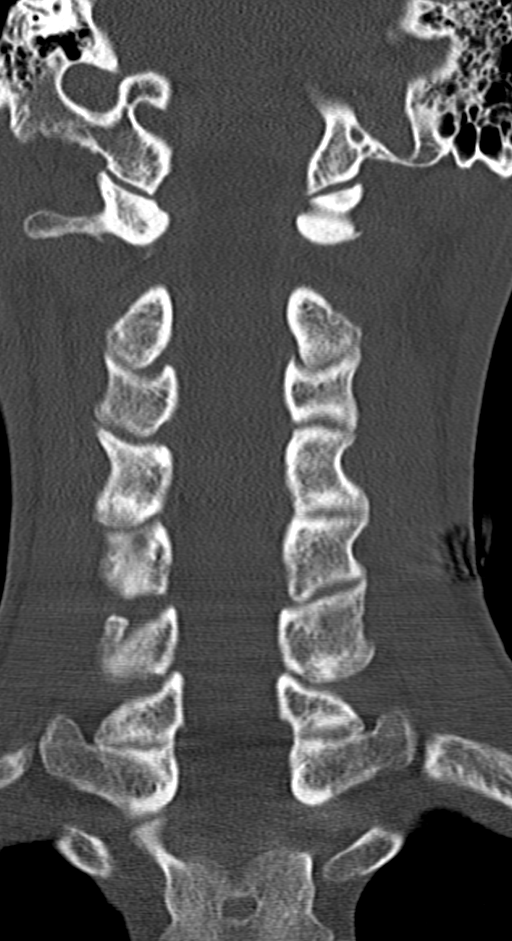
[im 28/46  bone]
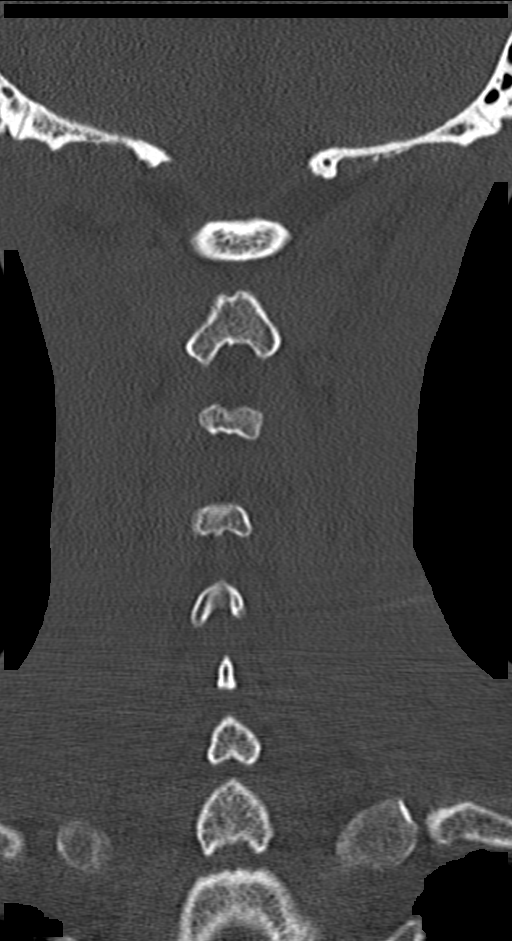

[Series 6: orthogonal bone · axial · 0.23mm/px · z∈[+54,+172]mm · 3 of 95 slices shown, 4 images]
[im 16/95  soft-tissue]
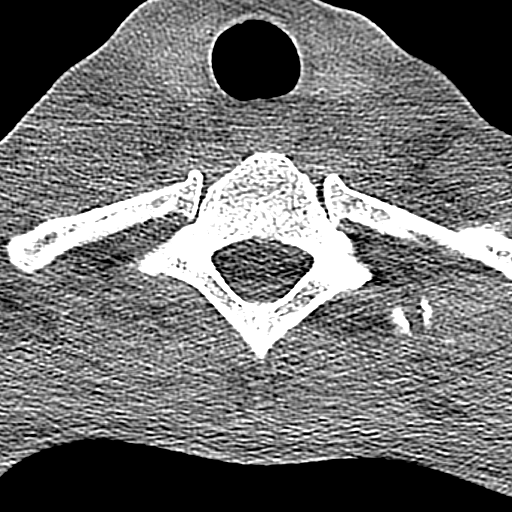
[im 16/95  bone]
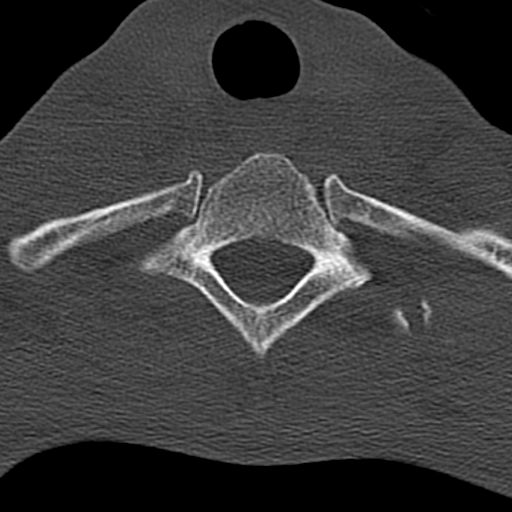
[im 48/95  bone]
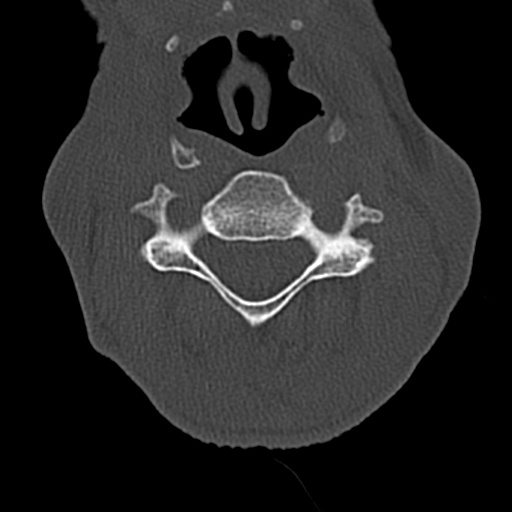
[im 79/95  bone]
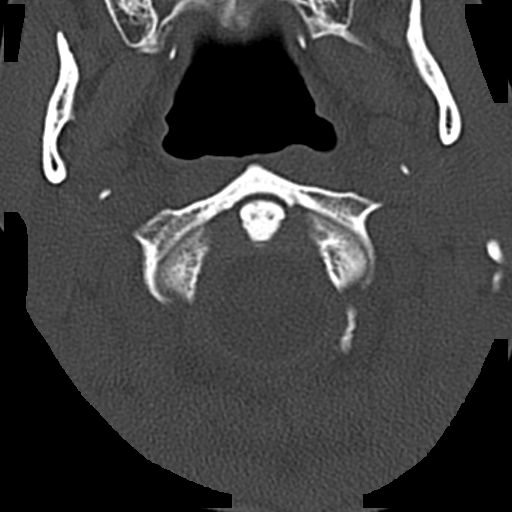

[11 of 33 positions shown; findings below may reference images not displayed]

FINDINGS: CT CERVICAL SPINE FINDINGS

Alignment: Straightening of cervical lordosis. No spondylolisthesis.
Cervicothoracic junction alignment is within normal limits.
Bilateral posterior element alignment is within normal limits.

Skull base and vertebrae: Visualized skull base is intact. No
atlanto-occipital dissociation. No acute osseous abnormality
identified. Cervical endplates are intact.

Soft tissues and spinal canal: Paucity of fat planes in the neck.
Negative noncontrast CT appearance of the cervical spinal canal and
paraspinal soft tissues. Decreased attenuation of vascular
structures suggesting anemia. Mild calcified plaque.

Stable visible posterior fossa. Mastoids and tympanic cavities
remain clear.

Disc levels: Negative aside from mild disc bulging. There is
anterior endplate spurring at C6-C7 and C7-T1. No cervical spinal
stenosis suspected.

CT THORACIC SPINE FINDINGS

Segmentation: Appears normal.

Alignment: Stable thoracic kyphosis since [REDACTED]. No
spondylolisthesis.

Vertebrae: No acute osseous abnormality identified. Thoracic
endplates appear intact.

Paraspinal and other soft tissues: Centrilobular emphysema.
Substantially improved right lower lobe ventilation since the
07/04/2018 CT although there is a residual cavitary lung lesion on
series 3, image 112. Right pleural effusion appears resolved.
Superimposed dependent atelectasis or scarring.

No pericardial effusion. Negative noncontrast appearance of the
paraspinal soft tissues.

Disc levels: Negative for age. Anterior disc bulging and endplate
spurring at T11-T12.

CT LUMBAR SPINE FINDINGS

Segmentation: Normal.

Alignment: Straightening of lumbar lordosis. Mild degenerative
appearing retrolisthesis of L5 on S1.

Vertebrae: No acute osseous abnormality identified. Lumbar endplates
are intact. Intact visible sacrum and SI joints.

Paraspinal and other soft tissues: Paucity of fat planes in the
abdomen, similar to the neck. Partially visible enteric contents in
the left abdomen. Negative other visible noncontrast abdominal
viscera. Negative noncontrast CT appearance of the paraspinal soft
tissues.

Disc levels: Negative aside from mild disc bulging. There is
anterior endplate spurring at L3-L4.
IMPRESSION: 1. No acute osseous abnormality and negative for age noncontrast CT
appearance of the cervical, thoracic, and lumbar spine.
2. Substantially improved right lung ventilation since [REDACTED] with
residual right lower lobe cavitary lesion.
3. Hypodensity of vascular structures suggesting anemia.
4. Emphysema ([id number]).

## 2021-08-01 ENCOUNTER — Other Ambulatory Visit: Payer: Self-pay | Admitting: Nurse Practitioner

## 2021-08-01 DIAGNOSIS — R748 Abnormal levels of other serum enzymes: Secondary | ICD-10-CM

## 2021-08-08 ENCOUNTER — Ambulatory Visit: Payer: Medicaid Other

## 2021-09-27 ENCOUNTER — Other Ambulatory Visit: Payer: Self-pay | Admitting: *Deleted

## 2021-09-27 DIAGNOSIS — Z122 Encounter for screening for malignant neoplasm of respiratory organs: Secondary | ICD-10-CM

## 2021-09-27 DIAGNOSIS — F17210 Nicotine dependence, cigarettes, uncomplicated: Secondary | ICD-10-CM

## 2021-09-27 DIAGNOSIS — Z87891 Personal history of nicotine dependence: Secondary | ICD-10-CM

## 2021-10-24 ENCOUNTER — Ambulatory Visit (INDEPENDENT_AMBULATORY_CARE_PROVIDER_SITE_OTHER): Payer: Medicaid Other | Admitting: Acute Care

## 2021-10-24 ENCOUNTER — Ambulatory Visit: Admission: RE | Admit: 2021-10-24 | Payer: Medicaid Other | Source: Ambulatory Visit

## 2021-10-24 ENCOUNTER — Telehealth: Payer: Self-pay | Admitting: Acute Care

## 2021-10-24 ENCOUNTER — Encounter: Payer: Self-pay | Admitting: Acute Care

## 2021-10-24 DIAGNOSIS — Z122 Encounter for screening for malignant neoplasm of respiratory organs: Secondary | ICD-10-CM

## 2021-10-24 NOTE — Progress Notes (Signed)
History of Present Illness Joshua Dougherty is a 51 y.o. male with    10/24/2021 Pt was a no show for his SDMV. His scan and SDMV will need to be re-secheduled. Please see telephone note dated 10/24/2021  Test Results:     Latest Ref Rng & Units 09/05/2018   10:14 AM 08/02/2018    4:37 AM 08/01/2018   12:42 AM  CBC  WBC 4.0 - 10.5 K/uL 4.4  7.2  6.9   Hemoglobin 13.0 - 17.0 g/dL 9.4  7.7  8.2   Hematocrit 39.0 - 52.0 % 28.6  22.6  23.7   Platelets 150 - 400 K/uL 298  209  207        Latest Ref Rng & Units 09/05/2018   10:14 AM 08/02/2018    4:37 AM 08/01/2018   12:42 AM  BMP  Glucose 70 - 99 mg/dL 140  116  144   BUN 6 - 20 mg/dL 12  16  19    Creatinine 0.61 - 1.24 mg/dL 0.75  0.79  0.80   Sodium 135 - 145 mmol/L 137  132  128   Potassium 3.5 - 5.1 mmol/L 3.9  3.8  3.8   Chloride 98 - 111 mmol/L 105  102  100   CO2 22 - 32 mmol/L 26  22  23    Calcium 8.9 - 10.3 mg/dL 8.3  7.7  7.4     BNP    Component Value Date/Time   BNP 61.0 07/29/2018 1601    ProBNP No results found for: "PROBNP"  PFT No results found for: "FEV1PRE", "FEV1POST", "FVCPRE", "FVCPOST", "TLC", "DLCOUNC", "PREFEV1FVCRT", "PSTFEV1FVCRT"  No results found.   Past medical hx Past Medical History:  Diagnosis Date   HIV (human immunodeficiency virus infection) ([place])    Renal disorder    Tuberculosis      Social History   Tobacco Use   Smoking status: Former    Packs/day: 0.25    Years: 20.00    Total pack years: 5.00    Types: Cigarettes   Smokeless tobacco: Never   Tobacco comments:    per pt has not smoked in last 30 days  Vaping Use   Vaping Use: Never used  Substance Use Topics   Alcohol use: Yes    Alcohol/week: 2.0 - 3.0 standard drinks of alcohol    Types: 2 - 3 Standard drinks or equivalent per week   Drug use: No    Joshua Dougherty reports that he has quit smoking. He has a 5.00 pack-year smoking history. He has never used smokeless tobacco. He reports current alcohol use of about  2.0 - 3.0 standard drinks of alcohol per week. He reports that he does not use drugs.  Tobacco Cessation: Counseling given: Not Answered Tobacco comments: per pt has not smoked in last 30 days   Past surgical hx, Family hx, Social hx all reviewed.  Current Outpatient Medications on File Prior to Visit  Medication Sig   bictegravir-emtricitabine-tenofovir AF (BIKTARVY) 50-200-25 MG TABS tablet Take 1 tablet by mouth daily.   ferrous sulfate 325 (65 FE) MG tablet Take 325 mg by mouth daily with breakfast. (Patient not taking: Reported on 11/09/2019)   Multiple Vitamins-Minerals (MULTIVITAMIN WITH MINERALS) tablet Take 1 tablet by mouth daily. (Patient not taking: Reported on 11/09/2019)   XARELTO 15 MG TABS tablet TAKE 1 TABLET BY MOUTH TWICE DAILY WITH FOOD FOR 21 DAYS (Patient not taking: Reported on 11/09/2019)   No current facility-administered medications on file  prior to visit.     No Known Allergies  Review Of Systems:  Constitutional:   No  weight loss, night sweats,  Fevers, chills, fatigue, or  lassitude.  HEENT:   No headaches,  Difficulty swallowing,  Tooth/dental problems, or  Sore throat,                No sneezing, itching, ear ache, nasal congestion, post nasal drip,   CV:  No chest pain,  Orthopnea, PND, swelling in lower extremities, anasarca, dizziness, palpitations, syncope.   GI  No heartburn, indigestion, abdominal pain, nausea, vomiting, diarrhea, change in bowel habits, loss of appetite, bloody stools.   Resp: No shortness of breath with exertion or at rest.  No excess mucus, no productive cough,  No non-productive cough,  No coughing up of blood.  No change in color of mucus.  No wheezing.  No chest wall deformity  Skin: no rash or lesions.  GU: no dysuria, change in color of urine, no urgency or frequency.  No flank pain, no hematuria   MS:  No joint pain or swelling.  No decreased range of motion.  No back pain.  Psych:  No change in mood or affect. No  depression or anxiety.  No memory loss.   Vital Signs There were no vitals taken for this visit.   Physical Exam:  General- No distress,  A&Ox3 ENT: No sinus tenderness, TM clear, pale nasal mucosa, no oral exudate,no post nasal drip, no LAN Cardiac: S1, S2, regular rate and rhythm, no murmur Chest: No wheeze/ rales/ dullness; no accessory muscle use, no nasal flaring, no sternal retractions Abd.: Soft Non-tender Ext: No clubbing cyanosis, edema Neuro:  normal strength Skin: No rashes, warm and dry Psych: normal mood and behavior   Assessment/Plan  No problem-specific Assessment & Plan notes found for this encounter.    Magdalen Spatz, NP 10/24/2021  2:23 PM

## 2021-10-24 NOTE — Telephone Encounter (Signed)
Patient had a scheduled 2 pm SDMV for today, and he has a 4 pm LDCT scheduled. He was a no show for the telephone visit. I called x 2 and got VM both times.I left a message asking that he call the office so we could get the visits completed. There was no call to the office. It is a requirement that SDMV is completed before the CT scan can be scheduled. We will cancel the CT Chest scheduled for today at 4 pm and both the [facility] and Scan will need to be re-scheduled.  Denise, please cancel the 4 pm LDCT and call patient to reschedule . Thanks so much.

## 2021-10-24 NOTE — Telephone Encounter (Signed)
PT called back and stated that he had a conflict and couldn't  get to the phone. I have rescheduled pt for [facility] and LDCT for 11/28/21. New appt paper has been mailed. Nothing further needed at this time.

## 2021-11-20 ENCOUNTER — Encounter (INDEPENDENT_AMBULATORY_CARE_PROVIDER_SITE_OTHER): Payer: Self-pay

## 2021-11-28 ENCOUNTER — Encounter: Payer: Medicaid Other | Admitting: Acute Care

## 2021-11-28 ENCOUNTER — Ambulatory Visit: Admission: RE | Admit: 2021-11-28 | Payer: Medicaid Other | Source: Ambulatory Visit

## 2021-11-28 NOTE — Progress Notes (Unsigned)
Virtual Visit via Telephone Note  I connected with Rich Number on 11/28/21 at  1:30 PM EST by telephone and verified that I am speaking with the correct person using two identifiers.  Location: Patient:  At home Provider:  [place], [place], [place], [address]    I discussed the limitations, risks, security and privacy concerns of performing an evaluation and management service by telephone and the availability of in person appointments. I also discussed with the patient that there may be a patient responsible charge related to this service. The patient expressed understanding and agreed to proceed.   Shared Decision Making Visit Lung Cancer Screening Program [phone])   Eligibility: Age 51 y.o. Pack Years Smoking History Calculation *** (# packs/per year x # years smoked) Recent History of coughing up blood  {YES NO:22349} Unexplained weight loss? {YES NO:22349} ( >Than 15 pounds within the last 6 months ) Prior History Lung / other cancer {YES NO:22349} (Diagnosis within the last 5 years already requiring surveillance chest CT Scans). Smoking Status {Smoking Status:21012044} Former Smokers: Years since quit: {Smoking numbers:21012046}  Quit Date: ***  Visit Components: Discussion included one or more decision making aids. {YES NO:22349} Discussion included risk/benefits of screening. {YES NO:22349} Discussion included potential follow up diagnostic testing for abnormal scans. {YES NO:22349} Discussion included meaning and risk of over diagnosis. {YES NO:22349} Discussion included meaning and risk of False Positives. {YES NO:22349} Discussion included meaning of total radiation exposure. {YES [id number]  Counseling Included: Importance of adherence to annual lung cancer LDCT screening. {YES NO:22349} Impact of comorbidities on ability to participate in the program. {YES NO:22349} Ability and willingness to under diagnostic treatment. {YES NO:22349}  Smoking  Cessation Counseling: Current Smokers:  Discussed importance of smoking cessation. {YES [id number] Information about tobacco cessation classes and interventions provided to patient. {YES [id number] Patient provided with "ticket" for LDCT Scan. {YES NO:22349} Symptomatic Patient. {YES NO:22349}  Counseling{Symptomatic Patient:21012041} Diagnosis Code: Tobacco Use Z72.0 Asymptomatic Patient {YES NO:22349}  Counseling {Asymptomatic patient:21012042} Former Smokers:  Discussed the importance of maintaining cigarette abstinence. {YES NO:22349} Diagnosis Code: Personal History of Nicotine Dependence. N39.767 Information about tobacco cessation classes and interventions provided to patient. {Responses; yes/no/refused:32142} Patient provided with "ticket" for LDCT Scan. {YES [id number] Written Order for Lung Cancer Screening with LDCT placed in Epic. {Smoking cessesion custom:21012043} (CT Chest Lung Cancer Screening Low Dose W/O CM) [id number] Z12.2-Screening of respiratory organs Z87.891-Personal history of nicotine dependence   I have spent 25 minutes of face to face/ virtual visit   time with *** discussing the risks and benefits of lung cancer screening. We viewed / discussed a power point together that explained in detail the above noted topics. We paused at intervals to allow for questions to be asked and answered to ensure understanding.We discussed that the single most powerful action that *** can take to decrease *** risk of developing lung cancer is to quit smoking. We discussed whether or not *** is ready to commit to setting a quit date. We discussed options for tools to aid in quitting smoking including nicotine replacement therapy, non-nicotine medications, support groups, Quit Smart classes, and behavior modification. We discussed that often times setting smaller, more achievable goals, such as eliminating 1 cigarette a day for a week and then 2 cigarettes a day for a week can be helpful in  slowly decreasing the number of cigarettes smoked. This allows for a sense of accomplishment as well as providing a clinical benefit. I provided  ***  with  smoking cessation  information  with contact information for community resources, classes, free nicotine replacement therapy, and access to mobile apps, text messaging, and on-line smoking cessation help. I have also provided  ***  the office contact information in the event *** needs to contact me, or the screening staff. We discussed the time and location of the scan, and that either Doroteo Glassman RN, Joella Prince, RN  or I will call / send a letter with the results within 24-72 hours of receiving them. The patient verbalized understanding of all of  the above and had no further questions upon leaving the office. They have my contact information in the event they have any further questions.  I spent *** minutes counseling on smoking cessation and the health risks of continued tobacco abuse.  I explained to the patient that there has been a high incidence of coronary artery disease noted on these exams. I explained that this is a non-gated exam therefore degree or severity cannot be determined. This patient is ***on statin therapy. I have asked the patient to follow-up with their PCP regarding any incidental finding of coronary artery disease and management with diet or medication as their PCP  feels is clinically indicated. The patient verbalized understanding of the above and had no further questions upon completion of the visit.      Magdalen Spatz, NP

## 2021-12-22 DEATH — deceased
